# Patient Record
Sex: Female | Born: 1980
Health system: Southern US, Community
[De-identification: ages and names within clinical notes are randomized; demographics above are authoritative.]

## PROBLEM LIST (undated history)

## (undated) DIAGNOSIS — G35 Multiple sclerosis: Secondary | ICD-10-CM

## (undated) DIAGNOSIS — R9409 Abnormal results of other function studies of central nervous system: Secondary | ICD-10-CM

## (undated) DIAGNOSIS — R269 Unspecified abnormalities of gait and mobility: Secondary | ICD-10-CM

## (undated) DIAGNOSIS — I10 Essential (primary) hypertension: Secondary | ICD-10-CM

## (undated) DIAGNOSIS — Z9181 History of falling: Secondary | ICD-10-CM

## (undated) HISTORY — PX: GALLBLADDER SURGERY: SHX652

## (undated) HISTORY — PX: CHOLECYSTECTOMY: SHX55

## (undated) HISTORY — DX: Unspecified abnormalities of gait and mobility: R26.9

## (undated) HISTORY — DX: History of falling: Z91.81

## (undated) HISTORY — DX: Abnormal results of other function studies of central nervous system: R94.09

---

## 2007-12-08 ENCOUNTER — Emergency Department (HOSPITAL_BASED_OUTPATIENT_CLINIC_OR_DEPARTMENT_OTHER): Admission: EM | Admit: 2007-12-08 | Discharge: 2007-12-09 | Payer: Self-pay | Admitting: Emergency Medicine

## 2008-03-11 ENCOUNTER — Emergency Department (HOSPITAL_BASED_OUTPATIENT_CLINIC_OR_DEPARTMENT_OTHER): Admission: EM | Admit: 2008-03-11 | Discharge: 2008-03-11 | Payer: Self-pay | Admitting: Emergency Medicine

## 2008-04-18 ENCOUNTER — Emergency Department (HOSPITAL_BASED_OUTPATIENT_CLINIC_OR_DEPARTMENT_OTHER): Admission: EM | Admit: 2008-04-18 | Discharge: 2008-04-18 | Payer: Self-pay | Admitting: Emergency Medicine

## 2008-11-22 ENCOUNTER — Emergency Department (HOSPITAL_BASED_OUTPATIENT_CLINIC_OR_DEPARTMENT_OTHER): Admission: EM | Admit: 2008-11-22 | Discharge: 2008-11-22 | Payer: Self-pay | Admitting: Emergency Medicine

## 2009-06-03 ENCOUNTER — Emergency Department (HOSPITAL_BASED_OUTPATIENT_CLINIC_OR_DEPARTMENT_OTHER): Admission: EM | Admit: 2009-06-03 | Discharge: 2009-06-03 | Payer: Self-pay | Admitting: Emergency Medicine

## 2010-05-12 ENCOUNTER — Emergency Department (HOSPITAL_BASED_OUTPATIENT_CLINIC_OR_DEPARTMENT_OTHER)
Admission: EM | Admit: 2010-05-12 | Discharge: 2010-05-12 | Payer: Self-pay | Source: Home / Self Care | Admitting: Emergency Medicine

## 2010-05-18 ENCOUNTER — Emergency Department (HOSPITAL_BASED_OUTPATIENT_CLINIC_OR_DEPARTMENT_OTHER)
Admission: EM | Admit: 2010-05-18 | Discharge: 2010-05-18 | Payer: Self-pay | Source: Home / Self Care | Admitting: Emergency Medicine

## 2010-05-19 ENCOUNTER — Emergency Department (HOSPITAL_BASED_OUTPATIENT_CLINIC_OR_DEPARTMENT_OTHER)
Admission: EM | Admit: 2010-05-19 | Discharge: 2010-05-19 | Payer: Self-pay | Source: Home / Self Care | Admitting: Emergency Medicine

## 2010-06-22 ENCOUNTER — Encounter
Admission: RE | Admit: 2010-06-22 | Discharge: 2010-06-22 | Payer: Self-pay | Source: Home / Self Care | Attending: Neurology | Admitting: Neurology

## 2010-07-04 ENCOUNTER — Encounter: Payer: Self-pay | Admitting: Neurology

## 2010-07-19 ENCOUNTER — Encounter (HOSPITAL_COMMUNITY)
Admission: RE | Admit: 2010-07-19 | Discharge: 2010-07-19 | Disposition: A | Discharge status: Home / Self Care | Payer: Managed Care, Other (non HMO) | Source: Ambulatory Visit | Attending: Neurology | Admitting: Neurology

## 2010-07-19 DIAGNOSIS — G35 Multiple sclerosis: Secondary | ICD-10-CM | POA: Insufficient documentation

## 2010-08-13 LAB — URINALYSIS, ROUTINE W REFLEX MICROSCOPIC
Hgb urine dipstick: NEGATIVE
Nitrite: NEGATIVE
Protein, ur: NEGATIVE mg/dL
Specific Gravity, Urine: 1.025 (ref 1.005–1.030)
Urobilinogen, UA: 2 mg/dL — ABNORMAL HIGH (ref 0.0–1.0)

## 2010-08-13 LAB — GLUCOSE, CAPILLARY: Glucose-Capillary: 85 mg/dL (ref 70–99)

## 2010-08-13 LAB — URINE CULTURE

## 2010-08-13 LAB — POCT TOXICOLOGY PANEL

## 2010-08-13 LAB — CBC
MCH: 28.3 pg (ref 26.0–34.0)
MCHC: 33.9 g/dL (ref 30.0–36.0)
MCV: 83.4 fL (ref 78.0–100.0)
Platelets: 340 10*3/uL (ref 150–400)

## 2010-08-13 LAB — COMPREHENSIVE METABOLIC PANEL
AST: 17 U/L (ref 0–37)
Albumin: 4.7 g/dL (ref 3.5–5.2)
BUN: 13 mg/dL (ref 6–23)
Calcium: 9.6 mg/dL (ref 8.4–10.5)
Creatinine, Ser: 0.8 mg/dL (ref 0.4–1.2)
GFR calc Af Amer: >60 mL/min (ref >60)
GFR calc non Af Amer: >60 mL/min (ref >60)

## 2010-08-13 LAB — URINE MICROSCOPIC-ADD ON

## 2010-08-13 LAB — DIFFERENTIAL
Basophils Absolute: 0 10*3/uL (ref 0.0–0.1)
Eosinophils Relative: 1 % (ref 0–5)
Lymphocytes Relative: 17 % (ref 12–46)
Lymphs Abs: 1.8 10*3/uL (ref 0.7–4.0)
Neutro Abs: 8 10*3/uL — ABNORMAL HIGH (ref 1.7–7.7)

## 2010-08-14 LAB — BASIC METABOLIC PANEL
CO2: 29 mEq/L (ref 19–32)
Calcium: 9.7 mg/dL (ref 8.4–10.5)
Chloride: 102 mEq/L (ref 96–112)
GFR calc Af Amer: >60 mL/min (ref >60)
Glucose, Bld: 66 mg/dL — ABNORMAL LOW (ref 70–99)
Potassium: 3.7 mEq/L (ref 3.5–5.1)
Sodium: 145 mEq/L (ref 135–145)

## 2010-08-14 LAB — DIFFERENTIAL
Eosinophils Absolute: 0.1 10*3/uL (ref 0.0–0.7)
Lymphs Abs: 1.8 10*3/uL (ref 0.7–4.0)
Monocytes Absolute: 0.5 10*3/uL (ref 0.1–1.0)
Monocytes Relative: 8 % (ref 3–12)
Neutrophils Relative %: 66 % (ref 43–77)

## 2010-08-14 LAB — CBC
HCT: 38 % (ref 36.0–46.0)
Hemoglobin: 12.9 g/dL (ref 12.0–15.0)
MCH: 28.1 pg (ref 26.0–34.0)
MCHC: 33.9 g/dL (ref 30.0–36.0)
RBC: 4.59 MIL/uL (ref 3.87–5.11)

## 2010-08-16 ENCOUNTER — Encounter (HOSPITAL_COMMUNITY): Payer: Managed Care, Other (non HMO) | Attending: Neurology

## 2010-08-16 DIAGNOSIS — G35 Multiple sclerosis: Secondary | ICD-10-CM | POA: Insufficient documentation

## 2010-09-10 LAB — BASIC METABOLIC PANEL
CO2: 28 mEq/L (ref 19–32)
Chloride: 105 mEq/L (ref 96–112)
GFR calc Af Amer: >60 mL/min (ref >60)
Glucose, Bld: 91 mg/dL (ref 70–99)
Sodium: 142 mEq/L (ref 135–145)

## 2010-09-10 LAB — CBC
Hemoglobin: 12 g/dL (ref 12.0–15.0)
MCHC: 34 g/dL (ref 30.0–36.0)
MCV: 86.7 fL (ref 78.0–100.0)
RBC: 4.07 MIL/uL (ref 3.87–5.11)
RDW: 13.6 % (ref 11.5–15.5)

## 2010-09-10 LAB — URINALYSIS, ROUTINE W REFLEX MICROSCOPIC
Bilirubin Urine: NEGATIVE
Glucose, UA: NEGATIVE mg/dL
Ketones, ur: NEGATIVE mg/dL
Protein, ur: NEGATIVE mg/dL

## 2010-09-10 LAB — DIFFERENTIAL
Basophils Relative: 0 % (ref 0–1)
Eosinophils Absolute: 0 10*3/uL (ref 0.0–0.7)
Eosinophils Relative: 0 % (ref 0–5)
Monocytes Absolute: 0.6 10*3/uL (ref 0.1–1.0)
Monocytes Relative: 9 % (ref 3–12)

## 2010-09-10 LAB — URINE MICROSCOPIC-ADD ON

## 2010-09-13 ENCOUNTER — Encounter (HOSPITAL_COMMUNITY)
Admission: RE | Admit: 2010-09-13 | Discharge: 2010-09-13 | Disposition: A | Discharge status: Home / Self Care | Payer: Managed Care, Other (non HMO) | Source: Ambulatory Visit | Attending: Neurology | Admitting: Neurology

## 2010-09-13 DIAGNOSIS — G35 Multiple sclerosis: Secondary | ICD-10-CM | POA: Insufficient documentation

## 2010-10-11 ENCOUNTER — Encounter (HOSPITAL_COMMUNITY)
Admission: RE | Admit: 2010-10-11 | Discharge: 2010-10-11 | Disposition: A | Discharge status: Home / Self Care | Payer: Managed Care, Other (non HMO) | Source: Ambulatory Visit | Attending: Neurology | Admitting: Neurology

## 2010-10-11 DIAGNOSIS — G35 Multiple sclerosis: Secondary | ICD-10-CM | POA: Insufficient documentation

## 2010-11-07 ENCOUNTER — Encounter (HOSPITAL_COMMUNITY): Payer: Managed Care, Other (non HMO)

## 2010-11-08 ENCOUNTER — Encounter (HOSPITAL_COMMUNITY): Payer: Managed Care, Other (non HMO) | Attending: Neurology

## 2010-11-08 ENCOUNTER — Encounter (HOSPITAL_COMMUNITY): Payer: Managed Care, Other (non HMO)

## 2010-11-08 DIAGNOSIS — G35 Multiple sclerosis: Secondary | ICD-10-CM | POA: Insufficient documentation

## 2010-11-09 ENCOUNTER — Encounter (HOSPITAL_COMMUNITY): Payer: Managed Care, Other (non HMO)

## 2010-11-12 ENCOUNTER — Encounter (HOSPITAL_COMMUNITY): Payer: Managed Care, Other (non HMO)

## 2010-11-13 ENCOUNTER — Encounter (HOSPITAL_COMMUNITY): Payer: Managed Care, Other (non HMO)

## 2010-11-14 ENCOUNTER — Encounter (HOSPITAL_COMMUNITY): Payer: Managed Care, Other (non HMO)

## 2010-11-15 ENCOUNTER — Encounter (HOSPITAL_COMMUNITY): Payer: Managed Care, Other (non HMO)

## 2010-11-16 ENCOUNTER — Encounter (HOSPITAL_COMMUNITY): Payer: Managed Care, Other (non HMO)

## 2010-11-19 ENCOUNTER — Encounter (HOSPITAL_COMMUNITY): Payer: Managed Care, Other (non HMO)

## 2010-11-20 ENCOUNTER — Encounter (HOSPITAL_COMMUNITY): Payer: Managed Care, Other (non HMO)

## 2010-11-21 ENCOUNTER — Encounter (HOSPITAL_COMMUNITY): Payer: Managed Care, Other (non HMO)

## 2010-11-22 ENCOUNTER — Encounter (HOSPITAL_COMMUNITY): Payer: Managed Care, Other (non HMO)

## 2010-11-23 ENCOUNTER — Encounter (HOSPITAL_COMMUNITY): Payer: Managed Care, Other (non HMO)

## 2010-11-26 ENCOUNTER — Encounter (HOSPITAL_COMMUNITY): Payer: Managed Care, Other (non HMO)

## 2010-11-27 ENCOUNTER — Encounter (HOSPITAL_COMMUNITY): Payer: Managed Care, Other (non HMO)

## 2010-11-28 ENCOUNTER — Encounter (HOSPITAL_COMMUNITY): Payer: Managed Care, Other (non HMO)

## 2010-11-29 ENCOUNTER — Encounter (HOSPITAL_COMMUNITY): Payer: Managed Care, Other (non HMO)

## 2010-11-30 ENCOUNTER — Encounter (HOSPITAL_COMMUNITY): Payer: Managed Care, Other (non HMO)

## 2010-12-03 ENCOUNTER — Encounter (HOSPITAL_COMMUNITY): Payer: Managed Care, Other (non HMO) | Attending: Neurology

## 2010-12-03 DIAGNOSIS — G35 Multiple sclerosis: Secondary | ICD-10-CM | POA: Insufficient documentation

## 2010-12-04 ENCOUNTER — Encounter (HOSPITAL_COMMUNITY): Payer: Managed Care, Other (non HMO)

## 2010-12-06 ENCOUNTER — Encounter (HOSPITAL_COMMUNITY): Payer: Managed Care, Other (non HMO)

## 2010-12-06 ENCOUNTER — Encounter (HOSPITAL_COMMUNITY): Payer: Managed Care, Other (non HMO) | Attending: Neurology

## 2010-12-06 DIAGNOSIS — G35 Multiple sclerosis: Secondary | ICD-10-CM | POA: Insufficient documentation

## 2010-12-07 ENCOUNTER — Encounter (HOSPITAL_COMMUNITY): Payer: Managed Care, Other (non HMO)

## 2011-01-03 ENCOUNTER — Encounter (HOSPITAL_COMMUNITY): Payer: Managed Care, Other (non HMO)

## 2011-01-10 ENCOUNTER — Encounter (HOSPITAL_COMMUNITY): Payer: Managed Care, Other (non HMO) | Attending: Neurology

## 2011-01-10 DIAGNOSIS — G35 Multiple sclerosis: Secondary | ICD-10-CM | POA: Insufficient documentation

## 2011-02-07 ENCOUNTER — Encounter (HOSPITAL_COMMUNITY): Payer: Managed Care, Other (non HMO)

## 2011-02-08 ENCOUNTER — Ambulatory Visit (HOSPITAL_COMMUNITY)
Admission: RE | Admit: 2011-02-08 | Discharge: 2011-02-08 | Disposition: A | Discharge status: Home / Self Care | Payer: Managed Care, Other (non HMO) | Source: Ambulatory Visit | Attending: Neurology | Admitting: Neurology

## 2011-02-08 DIAGNOSIS — G35 Multiple sclerosis: Secondary | ICD-10-CM | POA: Insufficient documentation

## 2011-02-28 LAB — CBC
HCT: 32 — ABNORMAL LOW
Hemoglobin: 11 — ABNORMAL LOW
MCV: 84.1
Platelets: 312
WBC: 14 — ABNORMAL HIGH

## 2011-02-28 LAB — URINALYSIS, ROUTINE W REFLEX MICROSCOPIC
Bilirubin Urine: NEGATIVE
Nitrite: POSITIVE — AB
Specific Gravity, Urine: 1.01
Urobilinogen, UA: 1
pH: 6

## 2011-02-28 LAB — COMPREHENSIVE METABOLIC PANEL
Alkaline Phosphatase: 133 — ABNORMAL HIGH
BUN: 11
CO2: 24
Chloride: 103
Creatinine, Ser: 1
GFR calc non Af Amer: >60
Glucose, Bld: 119 — ABNORMAL HIGH
Potassium: 3.7
Total Bilirubin: 0.8

## 2011-02-28 LAB — URINE MICROSCOPIC-ADD ON

## 2011-02-28 LAB — DIFFERENTIAL
Basophils Absolute: 0.2 — ABNORMAL HIGH
Basophils Relative: 1
Lymphocytes Relative: 8 — ABNORMAL LOW
Monocytes Absolute: 0.5
Neutro Abs: 12.1 — ABNORMAL HIGH
Neutrophils Relative %: 87 — ABNORMAL HIGH

## 2011-03-08 ENCOUNTER — Encounter (HOSPITAL_COMMUNITY)
Admission: RE | Admit: 2011-03-08 | Discharge: 2011-03-08 | Disposition: A | Discharge status: Home / Self Care | Payer: Managed Care, Other (non HMO) | Source: Ambulatory Visit | Attending: Neurology | Admitting: Neurology

## 2011-03-08 DIAGNOSIS — G35 Multiple sclerosis: Secondary | ICD-10-CM | POA: Insufficient documentation

## 2011-04-05 ENCOUNTER — Encounter (HOSPITAL_COMMUNITY): Payer: Managed Care, Other (non HMO)

## 2011-04-12 ENCOUNTER — Other Ambulatory Visit (HOSPITAL_COMMUNITY): Payer: Self-pay | Admitting: *Deleted

## 2011-04-15 ENCOUNTER — Encounter (HOSPITAL_COMMUNITY): Payer: Self-pay

## 2011-04-15 ENCOUNTER — Encounter (HOSPITAL_COMMUNITY)
Admission: RE | Admit: 2011-04-15 | Discharge: 2011-04-15 | Disposition: A | Discharge status: Home / Self Care | Payer: Managed Care, Other (non HMO) | Source: Ambulatory Visit | Attending: Neurology | Admitting: Neurology

## 2011-04-15 DIAGNOSIS — G35 Multiple sclerosis: Secondary | ICD-10-CM | POA: Insufficient documentation

## 2011-04-15 HISTORY — DX: Essential (primary) hypertension: I10

## 2011-04-15 HISTORY — DX: Multiple sclerosis: G35

## 2011-04-15 MED ORDER — LORATADINE 10 MG PO TABS
10.0000 mg | ORAL_TABLET | Freq: Every day | ORAL | Status: DC
  Administered 2011-04-15: 10 mg via ORAL

## 2011-04-15 MED ORDER — SODIUM CHLORIDE 0.9 % IV SOLN
300.0000 mg | INTRAVENOUS | Status: DC
  Administered 2011-04-15: 300 mg via INTRAVENOUS
  Filled 2011-04-15: qty 15

## 2011-04-15 MED ORDER — SODIUM CHLORIDE 0.9 % IV SOLN
INTRAVENOUS | Status: DC
  Administered 2011-04-15: 13:00:00 via INTRAVENOUS

## 2011-04-15 MED ORDER — ACETAMINOPHEN 500 MG PO TABS
1000.0000 mg | ORAL_TABLET | Freq: Once | ORAL | Status: AC
  Administered 2011-04-15: 1000 mg via ORAL
  Filled 2011-04-15: qty 2

## 2011-05-10 ENCOUNTER — Other Ambulatory Visit (HOSPITAL_COMMUNITY): Payer: Self-pay | Admitting: *Deleted

## 2011-05-13 ENCOUNTER — Encounter (HOSPITAL_COMMUNITY): Admission: RE | Admit: 2011-05-13 | Payer: Managed Care, Other (non HMO) | Source: Ambulatory Visit

## 2011-05-20 ENCOUNTER — Other Ambulatory Visit (HOSPITAL_COMMUNITY): Payer: Self-pay | Admitting: *Deleted

## 2011-05-23 ENCOUNTER — Other Ambulatory Visit (HOSPITAL_COMMUNITY): Payer: Self-pay | Admitting: *Deleted

## 2011-05-24 ENCOUNTER — Encounter (HOSPITAL_COMMUNITY): Payer: Self-pay

## 2011-05-24 ENCOUNTER — Encounter (HOSPITAL_COMMUNITY)
Admission: RE | Admit: 2011-05-24 | Discharge: 2011-05-24 | Disposition: A | Discharge status: Home / Self Care | Payer: Managed Care, Other (non HMO) | Source: Ambulatory Visit | Attending: Neurology | Admitting: Neurology

## 2011-05-24 DIAGNOSIS — G35 Multiple sclerosis: Secondary | ICD-10-CM | POA: Insufficient documentation

## 2011-05-24 MED ORDER — SODIUM CHLORIDE 0.9 % IV SOLN: INTRAVENOUS | Status: DC

## 2011-05-24 MED ORDER — LORATADINE 10 MG PO TABS
10.0000 mg | ORAL_TABLET | ORAL | Status: DC
  Administered 2011-05-24: 10 mg via ORAL

## 2011-05-24 MED ORDER — ACETAMINOPHEN 500 MG PO TABS
1000.0000 mg | ORAL_TABLET | ORAL | Status: DC
  Administered 2011-05-24: 1000 mg via ORAL
  Filled 2011-05-24: qty 2

## 2011-05-24 MED ORDER — SODIUM CHLORIDE 0.9 % IV SOLN
300.0000 mg | INTRAVENOUS | Status: DC
  Administered 2011-05-24: 300 mg via INTRAVENOUS
  Filled 2011-05-24: qty 15

## 2011-06-21 ENCOUNTER — Encounter (HOSPITAL_COMMUNITY)
Admission: RE | Admit: 2011-06-21 | Discharge: 2011-06-21 | Disposition: A | Discharge status: Home / Self Care | Payer: Managed Care, Other (non HMO) | Source: Ambulatory Visit | Attending: Neurology | Admitting: Neurology

## 2011-06-21 ENCOUNTER — Encounter (HOSPITAL_COMMUNITY): Payer: Self-pay

## 2011-06-21 DIAGNOSIS — G35 Multiple sclerosis: Secondary | ICD-10-CM | POA: Insufficient documentation

## 2011-06-21 MED ORDER — LORATADINE 10 MG PO TABS
10.0000 mg | ORAL_TABLET | ORAL | Status: DC
  Administered 2011-06-21: 10 mg via ORAL

## 2011-06-21 MED ORDER — SODIUM CHLORIDE 0.9 % IV SOLN
300.0000 mg | INTRAVENOUS | Status: DC
  Administered 2011-06-21: 300 mg via INTRAVENOUS
  Filled 2011-06-21: qty 15

## 2011-06-21 MED ORDER — ACETAMINOPHEN 500 MG PO TABS
1000.0000 mg | ORAL_TABLET | ORAL | Status: DC
  Administered 2011-06-21: 1000 mg via ORAL
  Filled 2011-06-21: qty 2

## 2011-06-21 MED ORDER — SODIUM CHLORIDE 0.9 % IV SOLN
INTRAVENOUS | Status: DC
  Administered 2011-06-21: 250 mL via INTRAVENOUS

## 2011-07-19 ENCOUNTER — Encounter (HOSPITAL_COMMUNITY)
Admission: RE | Admit: 2011-07-19 | Discharge: 2011-07-19 | Disposition: A | Discharge status: Home / Self Care | Payer: Managed Care, Other (non HMO) | Source: Ambulatory Visit | Attending: Neurology | Admitting: Neurology

## 2011-07-19 ENCOUNTER — Encounter (HOSPITAL_COMMUNITY): Payer: Self-pay

## 2011-07-19 DIAGNOSIS — G35 Multiple sclerosis: Secondary | ICD-10-CM | POA: Insufficient documentation

## 2011-07-19 MED ORDER — LORATADINE 10 MG PO TABS
10.0000 mg | ORAL_TABLET | ORAL | Status: AC
  Administered 2011-07-19: 10 mg via ORAL

## 2011-07-19 MED ORDER — ACETAMINOPHEN 500 MG PO TABS
1000.0000 mg | ORAL_TABLET | ORAL | Status: AC
  Administered 2011-07-19: 1000 mg via ORAL

## 2011-07-19 MED ORDER — SODIUM CHLORIDE 0.9 % IV SOLN
INTRAVENOUS | Status: AC
  Administered 2011-07-19: 16:00:00 via INTRAVENOUS

## 2011-07-19 MED ORDER — SODIUM CHLORIDE 0.9 % IV SOLN
300.0000 mg | INTRAVENOUS | Status: DC
  Administered 2011-07-19: 300 mg via INTRAVENOUS
  Filled 2011-07-19: qty 15

## 2011-08-16 ENCOUNTER — Encounter (HOSPITAL_COMMUNITY)
Admission: RE | Admit: 2011-08-16 | Discharge: 2011-08-16 | Disposition: A | Discharge status: Home / Self Care | Payer: Managed Care, Other (non HMO) | Source: Ambulatory Visit | Attending: Neurology | Admitting: Neurology

## 2011-08-16 ENCOUNTER — Encounter (HOSPITAL_COMMUNITY): Payer: Self-pay

## 2011-08-16 DIAGNOSIS — G35 Multiple sclerosis: Secondary | ICD-10-CM | POA: Insufficient documentation

## 2011-08-16 MED ORDER — SODIUM CHLORIDE 0.9 % IV SOLN
INTRAVENOUS | Status: AC
  Administered 2011-08-16: 17:00:00 via INTRAVENOUS

## 2011-08-16 MED ORDER — LORATADINE 10 MG PO TABS
10.0000 mg | ORAL_TABLET | ORAL | Status: AC
  Administered 2011-08-16: 10 mg via ORAL

## 2011-08-16 MED ORDER — SODIUM CHLORIDE 0.9 % IV SOLN
300.0000 mg | INTRAVENOUS | Status: AC
  Administered 2011-08-16: 300 mg via INTRAVENOUS
  Filled 2011-08-16: qty 15

## 2011-08-16 MED ORDER — ACETAMINOPHEN 500 MG PO TABS
1000.0000 mg | ORAL_TABLET | ORAL | Status: AC
  Administered 2011-08-16: 1000 mg via ORAL

## 2011-09-13 ENCOUNTER — Encounter (HOSPITAL_COMMUNITY): Payer: Self-pay

## 2011-09-13 ENCOUNTER — Encounter (HOSPITAL_COMMUNITY)
Admission: RE | Admit: 2011-09-13 | Discharge: 2011-09-13 | Disposition: A | Discharge status: Home / Self Care | Payer: Managed Care, Other (non HMO) | Source: Ambulatory Visit | Attending: Neurology | Admitting: Neurology

## 2011-09-13 DIAGNOSIS — G35 Multiple sclerosis: Secondary | ICD-10-CM | POA: Insufficient documentation

## 2011-09-13 MED ORDER — SODIUM CHLORIDE 0.9 % IV SOLN
INTRAVENOUS | Status: AC
  Administered 2011-09-13: 17:00:00 via INTRAVENOUS

## 2011-09-13 MED ORDER — ACETAMINOPHEN 325 MG PO TABS
ORAL_TABLET | ORAL | Status: AC
  Administered 2011-09-13: 975 mg via ORAL
  Filled 2011-09-13: qty 3

## 2011-09-13 MED ORDER — ACETAMINOPHEN 500 MG PO TABS
1000.0000 mg | ORAL_TABLET | ORAL | Status: AC
  Administered 2011-09-13: 975 mg via ORAL

## 2011-09-13 MED ORDER — LORATADINE 10 MG PO TABS
10.0000 mg | ORAL_TABLET | ORAL | Status: AC
  Administered 2011-09-13: 10 mg via ORAL

## 2011-09-13 MED ORDER — LORATADINE 10 MG PO TABS
ORAL_TABLET | ORAL | Status: AC
  Administered 2011-09-13: 10 mg via ORAL
  Filled 2011-09-13: qty 1

## 2011-09-13 MED ORDER — SODIUM CHLORIDE 0.9 % IV SOLN
300.0000 mg | INTRAVENOUS | Status: AC
  Administered 2011-09-13: 300 mg via INTRAVENOUS
  Filled 2011-09-13: qty 15

## 2011-09-13 NOTE — Discharge Instructions (Signed)
Next appointments 5/10 4:30 pm and 6/7 4:30 pm Call if you need to reschedule [phone].   Call your MD if any problems or questions.

## 2011-09-20 ENCOUNTER — Encounter (HOSPITAL_COMMUNITY): Payer: Managed Care, Other (non HMO)

## 2011-10-01 ENCOUNTER — Other Ambulatory Visit: Payer: Self-pay | Admitting: Neurology

## 2011-10-01 DIAGNOSIS — G35 Multiple sclerosis: Secondary | ICD-10-CM

## 2011-10-11 ENCOUNTER — Encounter (HOSPITAL_COMMUNITY)
Admission: RE | Admit: 2011-10-11 | Discharge: 2011-10-11 | Disposition: A | Discharge status: Home / Self Care | Payer: Managed Care, Other (non HMO) | Source: Ambulatory Visit | Attending: Neurology | Admitting: Neurology

## 2011-10-11 ENCOUNTER — Encounter (HOSPITAL_COMMUNITY): Payer: Self-pay

## 2011-10-11 DIAGNOSIS — G35 Multiple sclerosis: Secondary | ICD-10-CM | POA: Insufficient documentation

## 2011-10-11 MED ORDER — SODIUM CHLORIDE 0.9 % IV SOLN
INTRAVENOUS | Status: AC
  Administered 2011-10-11: 17:00:00 via INTRAVENOUS

## 2011-10-11 MED ORDER — ACETAMINOPHEN 500 MG PO TABS
ORAL_TABLET | ORAL | Status: AC
  Administered 2011-10-11: 1000 mg via ORAL
  Filled 2011-10-11: qty 2

## 2011-10-11 MED ORDER — LORATADINE 10 MG PO TABS
ORAL_TABLET | ORAL | Status: AC
  Administered 2011-10-11: 10 mg via ORAL
  Filled 2011-10-11: qty 1

## 2011-10-11 MED ORDER — SODIUM CHLORIDE 0.9 % IV SOLN
300.0000 mg | INTRAVENOUS | Status: AC
  Administered 2011-10-11: 300 mg via INTRAVENOUS
  Filled 2011-10-11: qty 15

## 2011-10-11 MED ORDER — LORATADINE 10 MG PO TABS
10.0000 mg | ORAL_TABLET | ORAL | Status: AC
  Administered 2011-10-11: 10 mg via ORAL

## 2011-10-11 MED ORDER — ACETAMINOPHEN 500 MG PO TABS
1000.0000 mg | ORAL_TABLET | ORAL | Status: AC
  Administered 2011-10-11: 1000 mg via ORAL

## 2011-10-15 ENCOUNTER — Ambulatory Visit
Admission: RE | Admit: 2011-10-15 | Discharge: 2011-10-15 | Disposition: A | Discharge status: Home / Self Care | Payer: Managed Care, Other (non HMO) | Source: Ambulatory Visit | Attending: Neurology | Admitting: Neurology

## 2011-10-15 ENCOUNTER — Other Ambulatory Visit: Payer: Managed Care, Other (non HMO)

## 2011-10-15 DIAGNOSIS — G35 Multiple sclerosis: Secondary | ICD-10-CM

## 2011-10-15 MED ORDER — GADOBENATE DIMEGLUMINE 529 MG/ML IV SOLN
14.0000 mL | Freq: Once | INTRAVENOUS | Status: AC | PRN
  Administered 2011-10-15: 14 mL via INTRAVENOUS

## 2011-11-08 ENCOUNTER — Encounter (HOSPITAL_COMMUNITY)
Admission: RE | Admit: 2011-11-08 | Discharge: 2011-11-08 | Disposition: A | Discharge status: Home / Self Care | Payer: Managed Care, Other (non HMO) | Source: Ambulatory Visit | Attending: Neurology | Admitting: Neurology

## 2011-11-08 ENCOUNTER — Encounter (HOSPITAL_COMMUNITY): Payer: Self-pay

## 2011-11-08 DIAGNOSIS — G35 Multiple sclerosis: Secondary | ICD-10-CM | POA: Insufficient documentation

## 2011-11-08 MED ORDER — ACETAMINOPHEN 500 MG PO TABS
ORAL_TABLET | ORAL | Status: AC
  Administered 2011-11-08: 1000 mg via ORAL
  Filled 2011-11-08: qty 2

## 2011-11-08 MED ORDER — SODIUM CHLORIDE 0.9 % IV SOLN
300.0000 mg | INTRAVENOUS | Status: DC
  Administered 2011-11-08: 300 mg via INTRAVENOUS
  Filled 2011-11-08: qty 15

## 2011-11-08 MED ORDER — ACETAMINOPHEN 500 MG PO TABS
1000.0000 mg | ORAL_TABLET | ORAL | Status: DC
  Administered 2011-11-08: 1000 mg via ORAL

## 2011-11-08 MED ORDER — LORATADINE 10 MG PO TABS
ORAL_TABLET | ORAL | Status: AC
  Administered 2011-11-08: 10 mg via ORAL
  Filled 2011-11-08: qty 1

## 2011-11-08 MED ORDER — LORATADINE 10 MG PO TABS
10.0000 mg | ORAL_TABLET | ORAL | Status: DC
  Administered 2011-11-08: 10 mg via ORAL

## 2011-11-08 MED ORDER — SODIUM CHLORIDE 0.9 % IV SOLN
INTRAVENOUS | Status: DC
  Administered 2011-11-08: 16:00:00 via INTRAVENOUS

## 2011-11-08 NOTE — Discharge Instructions (Signed)
Natalizumab injection What is this medicine? NATALIZUMAB (na ta LIZ you mab) is used to treat relapsing multiple sclerosis. This drug is not a cure. It is also used to treat Crohn's disease. This medicine may be used for other purposes; ask your health care provider or pharmacist if you have questions. What should I tell my health care provider before I take this medicine? They need to know if you have any of these conditions: -immune system problems -progressive multifocal leukoencephalopathy (PML) -an unusual or allergic reaction to natalizumab, other medicines, foods, dyes, or preservatives -pregnant or trying to get pregnant -breast-feeding How should I use this medicine? This medicine is for infusion into a vein. It is given by a health care professional in a hospital or clinic setting. A special MedGuide will be given to you by the pharmacist with each prescription and refill. Be sure to read this information carefully each time. Talk to your pediatrician regarding the use of this medicine in children. This medicine is not approved for use in children. Overdosage: If you think you have taken too much of this medicine contact a poison control center or emergency room at once. NOTE: This medicine is only for you. Do not share this medicine with others. What if I miss a dose? It is important not to miss your dose. Call your doctor or health care professional if you are unable to keep an appointment. What may interact with this medicine? -azathioprine -cyclosporine -interferon -6-mercaptopurine -methotrexate -steroid medicines like prednisone or cortisone -TNF-alpha inhibitors like adalimumab, etanercept, and infliximab -vaccines This list may not describe all possible interactions. Give your health care provider a list of all the medicines, herbs, non-prescription drugs, or dietary supplements you use. Also tell them if you smoke, drink alcohol, or use illegal drugs. Some items may  interact with your medicine. What should I watch for while using this medicine? Your condition will be monitored carefully while you are receiving this medicine. Visit your doctor for regular check ups. Tell your doctor or healthcare professional if your symptoms do not start to get better or if they get worse. Stay away from people who are sick. Call your doctor or health care professional for advice if you get a fever, chills or sore throat, or other symptoms of a cold or flu. Do not treat yourself. In some patients, this medicine may cause a serious brain infection that may cause death. If you have any problems seeing, thinking, speaking, walking, or standing, tell your doctor right away. If you cannot reach your doctor, get urgent medical care. What side effects may I notice from receiving this medicine? Side effects that you should report to your doctor or health care professional as soon as possible: -allergic reactions like skin rash, itching or hives, swelling of the face, lips, or tongue -breathing problems -changes in vision -chest pain -dark urine -depression, feelings of sadness -dizziness -general ill feeling or flu-like symptoms -irregular, missed, or painful menstrual periods -light-colored stools -loss of appetite, nausea -muscle weakness -problems with balance, talking, or walking -right upper belly pain -unusually weak or tired -yellowing of the eyes or skin Side effects that usually do not require medical attention (report to your doctor or health care professional if they continue or are bothersome): -aches, pains -headache -stomach upset -tiredness This list may not describe all possible side effects. Call your doctor for medical advice about side effects. You may report side effects to FDA at 1-800-FDA-1088. Where should I keep my medicine? This drug   is given in a hospital or clinic and will not be stored at home. NOTE: This sheet is a summary. It may not cover  all possible information. If you have questions about this medicine, talk to your doctor, pharmacist, or health care provider.  2012, Elsevier/Gold Standard. (07/09/2008 1:33:21 PM) 

## 2011-12-06 ENCOUNTER — Encounter (HOSPITAL_COMMUNITY): Payer: Self-pay

## 2011-12-06 ENCOUNTER — Encounter (HOSPITAL_COMMUNITY)
Admission: RE | Admit: 2011-12-06 | Discharge: 2011-12-06 | Disposition: A | Discharge status: Home / Self Care | Payer: Managed Care, Other (non HMO) | Source: Ambulatory Visit | Attending: Neurology | Admitting: Neurology

## 2011-12-06 DIAGNOSIS — G35 Multiple sclerosis: Secondary | ICD-10-CM | POA: Insufficient documentation

## 2011-12-06 MED ORDER — ACETAMINOPHEN 500 MG PO TABS
1000.0000 mg | ORAL_TABLET | ORAL | Status: DC
  Administered 2011-12-06: 1000 mg via ORAL

## 2011-12-06 MED ORDER — SODIUM CHLORIDE 0.9 % IV SOLN
300.0000 mg | INTRAVENOUS | Status: DC
  Administered 2011-12-06: 300 mg via INTRAVENOUS
  Filled 2011-12-06: qty 15

## 2011-12-06 MED ORDER — SODIUM CHLORIDE 0.9 % IV SOLN
500.0000 mg | INTRAVENOUS | Status: DC | PRN
  Filled 2011-12-06: qty 4

## 2011-12-06 MED ORDER — LORATADINE 10 MG PO TABS
ORAL_TABLET | ORAL | Status: AC
  Administered 2011-12-06: 10 mg
  Filled 2011-12-06: qty 1

## 2011-12-06 MED ORDER — ACETAMINOPHEN 325 MG PO TABS
ORAL_TABLET | ORAL | Status: AC
  Filled 2011-12-06: qty 3

## 2011-12-06 MED ORDER — LORATADINE 10 MG PO TABS: 10.0000 mg | ORAL_TABLET | ORAL | Status: DC

## 2011-12-06 MED ORDER — EPINEPHRINE 0.3 MG/0.3ML IJ DEVI
0.3000 mg | INTRAMUSCULAR | Status: DC | PRN
  Filled 2011-12-06: qty 0.6

## 2011-12-06 MED ORDER — SODIUM CHLORIDE 0.9 % IV SOLN
INTRAVENOUS | Status: DC
  Administered 2011-12-06: 250 mL via INTRAVENOUS

## 2011-12-06 NOTE — Progress Notes (Addendum)
Pt's BP has been elevated while in short stay today. She says it happens at her Dr's office too  and they have been monitoring her . Pt states he MD is in [facility] [place] . States she saw him last week. Encouraged pt to call MD and schedule another appointment with him for her BP issues. Pt is unable to give me correct spelling of Dr or Office in [employer] but assures me she has that information at home and will contact her MD. She states she feels fine and decines offer to go to ED for evaluation today

## 2012-01-02 ENCOUNTER — Other Ambulatory Visit: Payer: Self-pay | Admitting: Neurology

## 2012-01-03 ENCOUNTER — Encounter (HOSPITAL_COMMUNITY)
Admission: RE | Admit: 2012-01-03 | Discharge: 2012-01-03 | Disposition: A | Discharge status: Home / Self Care | Payer: Managed Care, Other (non HMO) | Source: Ambulatory Visit | Attending: Neurology | Admitting: Neurology

## 2012-01-03 ENCOUNTER — Encounter (HOSPITAL_COMMUNITY): Payer: Self-pay

## 2012-01-03 DIAGNOSIS — G35 Multiple sclerosis: Secondary | ICD-10-CM | POA: Insufficient documentation

## 2012-01-03 MED ORDER — ACETAMINOPHEN 500 MG PO TABS
1000.0000 mg | ORAL_TABLET | ORAL | Status: DC
  Administered 2012-01-03: 1000 mg via ORAL

## 2012-01-03 MED ORDER — LORATADINE 10 MG PO TABS
10.0000 mg | ORAL_TABLET | ORAL | Status: DC
  Administered 2012-01-03: 10 mg via ORAL

## 2012-01-03 MED ORDER — SODIUM CHLORIDE 0.9 % IV SOLN
INTRAVENOUS | Status: DC
Start: 2012-01-03 — End: 2012-01-04
  Administered 2012-01-03: 16:00:00 via INTRAVENOUS

## 2012-01-03 MED ORDER — ACETAMINOPHEN 500 MG PO TABS: 1000.0000 mg | ORAL_TABLET | ORAL | Status: DC

## 2012-01-03 MED ORDER — SODIUM CHLORIDE 0.9 % IV SOLN
300.0000 mg | INTRAVENOUS | Status: DC
  Administered 2012-01-03: 300 mg via INTRAVENOUS
  Filled 2012-01-03: qty 15

## 2012-01-03 NOTE — Progress Notes (Signed)
Patients BP remains elevated. She states she had gone to her medical doctor in [facility] and he had instructed her to take atenolol at night instead of in the morning. RN instructs her to take recording of BPs in morning and night and take record to her MD.

## 2012-01-31 ENCOUNTER — Encounter (HOSPITAL_COMMUNITY): Payer: Self-pay

## 2012-01-31 ENCOUNTER — Encounter (HOSPITAL_COMMUNITY)
Admission: RE | Admit: 2012-01-31 | Discharge: 2012-01-31 | Disposition: A | Discharge status: Home / Self Care | Payer: Managed Care, Other (non HMO) | Source: Ambulatory Visit | Attending: Neurology | Admitting: Neurology

## 2012-01-31 MED ORDER — LORATADINE 10 MG PO TABS
10.0000 mg | ORAL_TABLET | ORAL | Status: DC
  Administered 2012-01-31: 10 mg via ORAL
  Filled 2012-01-31: qty 1

## 2012-01-31 MED ORDER — SODIUM CHLORIDE 0.9 % IV SOLN
INTRAVENOUS | Status: DC
  Administered 2012-01-31: 12:00:00 via INTRAVENOUS

## 2012-01-31 MED ORDER — SODIUM CHLORIDE 0.9 % IV SOLN
300.0000 mg | INTRAVENOUS | Status: DC
  Administered 2012-01-31: 300 mg via INTRAVENOUS
  Filled 2012-01-31: qty 15

## 2012-01-31 MED ORDER — ACETAMINOPHEN 500 MG PO TABS
1000.0000 mg | ORAL_TABLET | ORAL | Status: DC
  Administered 2012-01-31: 1000 mg via ORAL
  Filled 2012-01-31: qty 2

## 2012-01-31 NOTE — Progress Notes (Addendum)
Pt brought name and phone # of her MD in [facility]. Dr Dalene Carrow at [facility] for [facility]. [phone]. Pt has seen MD since her last tysabri visit and he did not change her BP medications as she did not present with elevated BP. RN will send copy of her vitals signs to her MD. 1500  Attempted to call Dr Dalene Carrow. Office is closed. Faxed a note to Dr Dalene Carrow which included todays elevated BP. Pt refused to go to ER.

## 2012-02-28 ENCOUNTER — Encounter (HOSPITAL_COMMUNITY): Payer: Self-pay

## 2012-02-28 ENCOUNTER — Encounter (HOSPITAL_COMMUNITY)
Admission: RE | Admit: 2012-02-28 | Discharge: 2012-02-28 | Disposition: A | Discharge status: Home / Self Care | Payer: Managed Care, Other (non HMO) | Source: Ambulatory Visit | Attending: Neurology | Admitting: Neurology

## 2012-02-28 DIAGNOSIS — G35 Multiple sclerosis: Secondary | ICD-10-CM | POA: Insufficient documentation

## 2012-02-28 MED ORDER — LORATADINE 10 MG PO TABS
10.0000 mg | ORAL_TABLET | ORAL | Status: AC
  Administered 2012-02-28: 10 mg via ORAL
  Filled 2012-02-28: qty 1

## 2012-02-28 MED ORDER — ACETAMINOPHEN 500 MG PO TABS
1000.0000 mg | ORAL_TABLET | ORAL | Status: AC
  Administered 2012-02-28: 1000 mg via ORAL
  Filled 2012-02-28: qty 2

## 2012-02-28 MED ORDER — SODIUM CHLORIDE 0.9 % IV SOLN
300.0000 mg | INTRAVENOUS | Status: AC
  Administered 2012-02-28: 300 mg via INTRAVENOUS
  Filled 2012-02-28: qty 15

## 2012-02-28 MED ORDER — SODIUM CHLORIDE 0.9 % IV SOLN
INTRAVENOUS | Status: AC
  Administered 2012-02-28: 12:00:00 via INTRAVENOUS

## 2012-02-28 NOTE — Progress Notes (Signed)
Pt presents to short stay with elevated BP. Pt states she just went to her neurologist yesterday and her BP was 123/88. She states her BP is elevated only when she comes to short stay.Will continue to observe and communicate with her neurologist and primary MD.

## 2012-03-27 ENCOUNTER — Encounter (HOSPITAL_COMMUNITY): Admission: RE | Admit: 2012-03-27 | Payer: Managed Care, Other (non HMO) | Source: Ambulatory Visit

## 2012-03-30 ENCOUNTER — Other Ambulatory Visit: Payer: Self-pay | Admitting: Neurology

## 2012-04-03 ENCOUNTER — Encounter (HOSPITAL_COMMUNITY): Payer: Self-pay

## 2012-04-03 ENCOUNTER — Encounter (HOSPITAL_COMMUNITY)
Admission: RE | Admit: 2012-04-03 | Discharge: 2012-04-03 | Disposition: A | Discharge status: Home / Self Care | Payer: Managed Care, Other (non HMO) | Source: Ambulatory Visit | Attending: Neurology | Admitting: Neurology

## 2012-04-03 DIAGNOSIS — G35 Multiple sclerosis: Secondary | ICD-10-CM | POA: Insufficient documentation

## 2012-04-03 MED ORDER — SODIUM CHLORIDE 0.9 % IV SOLN
INTRAVENOUS | Status: DC
  Administered 2012-04-03: 13:00:00 via INTRAVENOUS

## 2012-04-03 MED ORDER — SODIUM CHLORIDE 0.9 % IV SOLN
300.0000 mg | INTRAVENOUS | Status: DC
  Administered 2012-04-03: 300 mg via INTRAVENOUS
  Filled 2012-04-03: qty 15

## 2012-04-03 MED ORDER — LORATADINE 10 MG PO TABS
10.0000 mg | ORAL_TABLET | ORAL | Status: DC
  Administered 2012-04-03: 10 mg via ORAL
  Filled 2012-04-03: qty 1

## 2012-04-03 MED ORDER — ACETAMINOPHEN 500 MG PO TABS
1000.0000 mg | ORAL_TABLET | ORAL | Status: DC
  Administered 2012-04-03: 1000 mg via ORAL
  Filled 2012-04-03: qty 2

## 2012-04-03 MED ORDER — SODIUM CHLORIDE 0.9 % IV SOLN
500.0000 mg | INTRAVENOUS | Status: DC
  Filled 2012-04-03: qty 4

## 2012-04-03 MED ORDER — EPINEPHRINE 0.3 MG/0.3ML IJ DEVI
0.3000 mg | INTRAMUSCULAR | Status: DC
  Filled 2012-04-03: qty 0.6

## 2012-05-01 ENCOUNTER — Encounter (HOSPITAL_COMMUNITY)
Admission: RE | Admit: 2012-05-01 | Discharge: 2012-05-01 | Disposition: A | Discharge status: Home / Self Care | Payer: Managed Care, Other (non HMO) | Source: Ambulatory Visit | Attending: Neurology | Admitting: Neurology

## 2012-05-01 MED ORDER — SODIUM CHLORIDE 0.9 % IV SOLN
300.0000 mg | INTRAVENOUS | Status: DC
  Administered 2012-05-01: 300 mg via INTRAVENOUS
  Filled 2012-05-01: qty 15

## 2012-05-01 MED ORDER — ACETAMINOPHEN 500 MG PO TABS
1000.0000 mg | ORAL_TABLET | ORAL | Status: DC
  Administered 2012-05-01: 1000 mg via ORAL
  Filled 2012-05-01: qty 2

## 2012-05-01 MED ORDER — SODIUM CHLORIDE 0.9 % IV SOLN
INTRAVENOUS | Status: DC
  Administered 2012-05-01: 12:00:00 via INTRAVENOUS

## 2012-05-01 MED ORDER — LORATADINE 10 MG PO TABS
10.0000 mg | ORAL_TABLET | ORAL | Status: DC
  Administered 2012-05-01: 10 mg via ORAL
  Filled 2012-05-01: qty 1

## 2012-05-20 ENCOUNTER — Other Ambulatory Visit: Payer: Self-pay | Admitting: Neurology

## 2012-05-20 DIAGNOSIS — R9089 Other abnormal findings on diagnostic imaging of central nervous system: Secondary | ICD-10-CM

## 2012-05-20 DIAGNOSIS — R269 Unspecified abnormalities of gait and mobility: Secondary | ICD-10-CM

## 2012-05-20 DIAGNOSIS — G35 Multiple sclerosis: Secondary | ICD-10-CM

## 2012-05-28 ENCOUNTER — Ambulatory Visit
Admission: RE | Admit: 2012-05-28 | Discharge: 2012-05-28 | Disposition: A | Discharge status: Home / Self Care | Payer: Managed Care, Other (non HMO) | Source: Ambulatory Visit | Attending: Neurology | Admitting: Neurology

## 2012-05-28 DIAGNOSIS — G35 Multiple sclerosis: Secondary | ICD-10-CM

## 2012-05-28 DIAGNOSIS — R269 Unspecified abnormalities of gait and mobility: Secondary | ICD-10-CM

## 2012-05-28 DIAGNOSIS — R9089 Other abnormal findings on diagnostic imaging of central nervous system: Secondary | ICD-10-CM

## 2012-05-28 MED ORDER — GADOBENATE DIMEGLUMINE 529 MG/ML IV SOLN
14.0000 mL | Freq: Once | INTRAVENOUS | Status: AC | PRN
  Administered 2012-05-28: 14 mL via INTRAVENOUS

## 2012-05-29 ENCOUNTER — Encounter (HOSPITAL_COMMUNITY): Payer: Self-pay

## 2012-05-29 ENCOUNTER — Encounter (HOSPITAL_COMMUNITY)
Admission: RE | Admit: 2012-05-29 | Discharge: 2012-05-29 | Disposition: A | Discharge status: Home / Self Care | Payer: Managed Care, Other (non HMO) | Source: Ambulatory Visit | Attending: Neurology | Admitting: Neurology

## 2012-05-29 DIAGNOSIS — G35 Multiple sclerosis: Secondary | ICD-10-CM | POA: Insufficient documentation

## 2012-05-29 MED ORDER — SODIUM CHLORIDE 0.9 % IV SOLN
300.0000 mg | INTRAVENOUS | Status: AC
  Administered 2012-05-29: 300 mg via INTRAVENOUS
  Filled 2012-05-29: qty 15

## 2012-05-29 MED ORDER — LORATADINE 10 MG PO TABS
10.0000 mg | ORAL_TABLET | ORAL | Status: AC
  Administered 2012-05-29: 10 mg via ORAL
  Filled 2012-05-29: qty 1

## 2012-05-29 MED ORDER — SODIUM CHLORIDE 0.9 % IV SOLN
INTRAVENOUS | Status: AC
  Administered 2012-05-29: 11:00:00 via INTRAVENOUS

## 2012-05-29 MED ORDER — ACETAMINOPHEN 500 MG PO TABS
1000.0000 mg | ORAL_TABLET | ORAL | Status: AC
  Administered 2012-05-29: 1000 mg via ORAL
  Filled 2012-05-29: qty 2

## 2012-06-23 ENCOUNTER — Other Ambulatory Visit: Payer: Self-pay | Admitting: Neurology

## 2012-06-24 ENCOUNTER — Other Ambulatory Visit (HOSPITAL_COMMUNITY): Payer: Self-pay | Admitting: Neurology

## 2012-06-26 ENCOUNTER — Encounter (HOSPITAL_COMMUNITY)
Admission: RE | Admit: 2012-06-26 | Discharge: 2012-06-26 | Disposition: A | Discharge status: Home / Self Care | Payer: Managed Care, Other (non HMO) | Source: Ambulatory Visit | Attending: Neurology | Admitting: Neurology

## 2012-06-26 ENCOUNTER — Encounter (HOSPITAL_COMMUNITY): Payer: Self-pay

## 2012-06-26 DIAGNOSIS — G35 Multiple sclerosis: Secondary | ICD-10-CM | POA: Insufficient documentation

## 2012-06-26 MED ORDER — ACETAMINOPHEN 500 MG PO TABS
1000.0000 mg | ORAL_TABLET | ORAL | Status: DC
  Administered 2012-06-26: 1000 mg via ORAL
  Filled 2012-06-26: qty 2

## 2012-06-26 MED ORDER — NATALIZUMAB 300 MG/15ML IV CONC
300.0000 mg | INTRAVENOUS | Status: DC
  Administered 2012-06-26: 300 mg via INTRAVENOUS
  Filled 2012-06-26: qty 15

## 2012-06-26 MED ORDER — SODIUM CHLORIDE 0.9 % IV SOLN
INTRAVENOUS | Status: DC
  Administered 2012-06-26: 12:00:00 via INTRAVENOUS

## 2012-06-26 MED ORDER — LORATADINE 10 MG PO TABS
10.0000 mg | ORAL_TABLET | ORAL | Status: DC
  Administered 2012-06-26: 10 mg via ORAL
  Filled 2012-06-26: qty 1

## 2012-07-24 ENCOUNTER — Encounter (HOSPITAL_COMMUNITY): Admission: RE | Admit: 2012-07-24 | Payer: Self-pay | Source: Ambulatory Visit

## 2012-07-27 ENCOUNTER — Encounter (HOSPITAL_COMMUNITY)
Admission: RE | Admit: 2012-07-27 | Discharge: 2012-07-27 | Disposition: A | Discharge status: Home / Self Care | Payer: Self-pay | Source: Ambulatory Visit | Attending: Neurology | Admitting: Neurology

## 2012-07-27 ENCOUNTER — Encounter (HOSPITAL_COMMUNITY): Payer: Self-pay

## 2012-07-27 DIAGNOSIS — G35 Multiple sclerosis: Secondary | ICD-10-CM | POA: Insufficient documentation

## 2012-07-27 MED ORDER — LORATADINE 10 MG PO TABS
10.0000 mg | ORAL_TABLET | ORAL | Status: DC
  Administered 2012-07-27: 10 mg via ORAL
  Filled 2012-07-27: qty 1

## 2012-07-27 MED ORDER — SODIUM CHLORIDE 0.9 % IV SOLN
300.0000 mg | INTRAVENOUS | Status: DC
  Administered 2012-07-27: 300 mg via INTRAVENOUS
  Filled 2012-07-27 (×2): qty 15

## 2012-07-27 MED ORDER — ACETAMINOPHEN 500 MG PO TABS
1000.0000 mg | ORAL_TABLET | ORAL | Status: DC
  Administered 2012-07-27: 1000 mg via ORAL
  Filled 2012-07-27: qty 3

## 2012-07-27 MED ORDER — SODIUM CHLORIDE 0.9 % IV SOLN
INTRAVENOUS | Status: DC
  Administered 2012-07-27: 13:00:00 via INTRAVENOUS

## 2012-08-21 ENCOUNTER — Telehealth: Payer: Self-pay | Admitting: Neurology

## 2012-08-21 ENCOUNTER — Other Ambulatory Visit (HOSPITAL_COMMUNITY): Payer: Self-pay | Admitting: Neurology

## 2012-08-21 NOTE — Telephone Encounter (Signed)
Fax paper order to infusion center

## 2012-08-21 NOTE — Telephone Encounter (Signed)
Need orders for tysabri. (place under hospital orders).

## 2012-08-24 ENCOUNTER — Encounter (HOSPITAL_COMMUNITY): Payer: Self-pay

## 2012-08-24 ENCOUNTER — Encounter (HOSPITAL_COMMUNITY)
Admission: RE | Admit: 2012-08-24 | Discharge: 2012-08-24 | Disposition: A | Discharge status: Home / Self Care | Payer: Medicaid Other | Source: Ambulatory Visit | Attending: Neurology | Admitting: Neurology

## 2012-08-24 DIAGNOSIS — G35 Multiple sclerosis: Secondary | ICD-10-CM | POA: Insufficient documentation

## 2012-08-24 MED ORDER — LORATADINE 10 MG PO TABS
10.0000 mg | ORAL_TABLET | ORAL | Status: DC
  Administered 2012-08-24: 10 mg via ORAL
  Filled 2012-08-24: qty 1

## 2012-08-24 MED ORDER — NATALIZUMAB 300 MG/15ML IV CONC
300.0000 mg | INTRAVENOUS | Status: DC
  Administered 2012-08-24: 300 mg via INTRAVENOUS
  Filled 2012-08-24: qty 15

## 2012-08-24 MED ORDER — ACETAMINOPHEN 500 MG PO TABS
1000.0000 mg | ORAL_TABLET | ORAL | Status: DC
  Administered 2012-08-24: 1000 mg via ORAL
  Filled 2012-08-24: qty 2

## 2012-08-24 MED ORDER — SODIUM CHLORIDE 0.9 % IV SOLN
INTRAVENOUS | Status: DC
  Administered 2012-08-24: 15:00:00 via INTRAVENOUS

## 2012-08-24 NOTE — Telephone Encounter (Signed)
Done. To be scanned to EMR. ssy  08-21-12

## 2012-08-24 NOTE — Progress Notes (Signed)
Pt arrived today states she "has a cold" Temp 99.2 then 15 minutes later 98.7 Pt has raspy voice and dry cough but states she feels fine. Placed call to [facility] and talked with Lupita Leash who spoke withDr Terrace Arabia. Pt is OK to receive Tysabri today.

## 2012-08-24 NOTE — Progress Notes (Signed)
Patient had to leave at 1640 to pick up children. Infusion finished at 1555. Was unable to stay for the whole one hour observation period post Tysabri. VSS. Patient states she feels "fine".

## 2012-09-02 ENCOUNTER — Encounter: Payer: Self-pay | Admitting: Neurology

## 2012-09-02 ENCOUNTER — Telehealth: Payer: Self-pay

## 2012-09-02 DIAGNOSIS — R269 Unspecified abnormalities of gait and mobility: Secondary | ICD-10-CM | POA: Insufficient documentation

## 2012-09-02 DIAGNOSIS — R9409 Abnormal results of other function studies of central nervous system: Secondary | ICD-10-CM | POA: Insufficient documentation

## 2012-09-02 NOTE — Telephone Encounter (Signed)
I have called Tangie, left message.

## 2012-09-02 NOTE — Telephone Encounter (Signed)
Patient  Calling requesting to speak to Dr.Yan only.

## 2012-09-21 ENCOUNTER — Encounter (HOSPITAL_COMMUNITY)
Admission: RE | Admit: 2012-09-21 | Discharge: 2012-09-21 | Disposition: A | Discharge status: Home / Self Care | Payer: Medicaid Other | Source: Ambulatory Visit | Attending: Neurology | Admitting: Neurology

## 2012-09-21 ENCOUNTER — Encounter (HOSPITAL_COMMUNITY): Payer: Self-pay

## 2012-09-21 DIAGNOSIS — G35 Multiple sclerosis: Secondary | ICD-10-CM | POA: Insufficient documentation

## 2012-09-21 MED ORDER — SODIUM CHLORIDE 0.9 % IV SOLN
300.0000 mg | INTRAVENOUS | Status: DC
  Administered 2012-09-21: 300 mg via INTRAVENOUS
  Filled 2012-09-21: qty 15

## 2012-09-21 MED ORDER — ACETAMINOPHEN 500 MG PO TABS
1000.0000 mg | ORAL_TABLET | ORAL | Status: DC
  Administered 2012-09-21: 1000 mg via ORAL
  Filled 2012-09-21: qty 2

## 2012-09-21 MED ORDER — LORATADINE 10 MG PO TABS
10.0000 mg | ORAL_TABLET | ORAL | Status: DC
  Administered 2012-09-21: 10 mg via ORAL
  Filled 2012-09-21: qty 1

## 2012-09-21 MED ORDER — SODIUM CHLORIDE 0.9 % IV SOLN
INTRAVENOUS | Status: DC
  Administered 2012-09-21: 20 mL/h via INTRAVENOUS

## 2012-09-22 ENCOUNTER — Telehealth: Payer: Self-pay

## 2012-09-22 NOTE — Telephone Encounter (Signed)
Patient calling and wants to speak with Lupita Leash about social security . Social security was denied. Told patient Lupita Leash was out of the office and will have her return call 09/24/2012. Patient was fine to wait for Lupita Leash to return.

## 2012-09-30 NOTE — Telephone Encounter (Signed)
Spoke to Hope and she just wanted to let us know that she appealed her Social Security denial.  I told her there is nothing we do on our end, but she will keep [place] informed.

## 2012-10-19 ENCOUNTER — Encounter (HOSPITAL_COMMUNITY)
Admission: RE | Admit: 2012-10-19 | Discharge: 2012-10-19 | Disposition: A | Discharge status: Home / Self Care | Payer: Medicaid Other | Source: Ambulatory Visit | Attending: Neurology | Admitting: Neurology

## 2012-10-19 DIAGNOSIS — G35 Multiple sclerosis: Secondary | ICD-10-CM | POA: Insufficient documentation

## 2012-10-19 MED ORDER — SODIUM CHLORIDE 0.9 % IV SOLN
300.0000 mg | INTRAVENOUS | Status: DC
  Administered 2012-10-19: 300 mg via INTRAVENOUS
  Filled 2012-10-19: qty 15

## 2012-10-19 MED ORDER — ACETAMINOPHEN 500 MG PO TABS
1000.0000 mg | ORAL_TABLET | ORAL | Status: DC
  Administered 2012-10-19: 1000 mg via ORAL
  Filled 2012-10-19: qty 2

## 2012-10-19 MED ORDER — LORATADINE 10 MG PO TABS
10.0000 mg | ORAL_TABLET | ORAL | Status: DC
  Administered 2012-10-19: 10 mg via ORAL
  Filled 2012-10-19: qty 1

## 2012-10-19 MED ORDER — SODIUM CHLORIDE 0.9 % IV SOLN
INTRAVENOUS | Status: DC
  Administered 2012-10-19: 20 mL/h via INTRAVENOUS

## 2012-11-09 ENCOUNTER — Encounter: Payer: Self-pay | Admitting: Neurology

## 2012-11-09 DIAGNOSIS — Z9181 History of falling: Secondary | ICD-10-CM | POA: Insufficient documentation

## 2012-11-10 ENCOUNTER — Ambulatory Visit (INDEPENDENT_AMBULATORY_CARE_PROVIDER_SITE_OTHER): Payer: Medicaid Other | Admitting: Neurology

## 2012-11-10 ENCOUNTER — Encounter: Payer: Self-pay | Admitting: Neurology

## 2012-11-10 VITALS — BP 146/97 | HR 72 | Ht 63.0 in | Wt 168.0 lb

## 2012-11-10 DIAGNOSIS — G35D Multiple sclerosis, unspecified: Secondary | ICD-10-CM

## 2012-11-10 DIAGNOSIS — G35 Multiple sclerosis: Secondary | ICD-10-CM | POA: Insufficient documentation

## 2012-11-10 DIAGNOSIS — R269 Unspecified abnormalities of gait and mobility: Secondary | ICD-10-CM

## 2012-11-10 DIAGNOSIS — R9409 Abnormal results of other function studies of central nervous system: Secondary | ICD-10-CM

## 2012-11-10 DIAGNOSIS — Z9181 History of falling: Secondary | ICD-10-CM

## 2012-11-10 NOTE — Progress Notes (Signed)
History of Present Illness:   Julie Pope is a 32 year old right-handed [place] American female, follow up for MS.  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  She denied loss of consciousness, she denies incontinence, and bilateral upper extremity weakness, denied bulbar weakness, no visual change.  She is referred for MRI of the brain,in May 19, 2010 without contrast,   demonstrated diffuse white matter changes throughout the supratentorial and infratentorial region, with corpus callosum involvement,  MRI scan of the cervical spine shows demyelinating enhancing plaques at C 3 on right and  C 4-5 on left. consistent with MS  Repeat MRI scan of the brain in 2012,  showed multiple brainstem, cerebellar, subcortical, corpus callosal and periventricular white matter lesions typical for demyelinating disease. Several T 1 black holes and enhancing lesions are seen bilaterally suggesting chronic and active disease. Significant progression and worsening compared with noncontrast MRI 05/19/10.   CSF showed elevated Ig G index, 9 ocb, extensive lab test for mimic were negative, including HIV, RPR, b12 b6, ACE lyme SSA, B, CBC, CMP, PEP, ana, copper, mild ele crp 6.5, and very low vit D<4.  [name] Was started in February 2012,   she responded very well to tysabri, no side effect noticed, she is receiving it Thursday 5 PM every 28 days, she continues to has mild gait difficulty,  she can only walk half  miles each time, her legs would give out with prolonged walking, no upper extremity symptoms, no visual loss,  Repeat MRI of the brain 10/15/11 with slight improvement over previous study from 06/22/10.Prior enhancing lesions have resolved. Some of the chronic plaques have slightly  decreased in size. No new complaints. 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  Repeat MRI brain in 05/2012, shows multiple infratentorial and supratentorial white matter hyperintensities typical for multiple sclerosis. No enhancing lesions are noted. The presence of several T1 black holes and atrophy of corpus callosum indicates chronic disease. Overall no significant changes compared with previous MRI dated 10/15/2011.  UPDATE June 10th 2014: She has not gone to work since Sep 2013. She is approved for long term disability since April 2014, which only lasting for one year and half. She is in the process of apply for social disability, she is still receiving [name], continues gait difficulty, no significant worsening  Review of Systems  Out of a complete 14 system review, the patient complains of only the following symptoms, and all other reviewed systems are negative.   Constitutional:   N/A Cardiovascular:  N/A Ear/Nose/Throat:  N/A Skin: N/A Eyes: N/A Respiratory: N/A Gastroitestinal: N/A    Hematology/Lymphatic:  N/A Endocrine:  N/A Musculoskeletal:N/A Allergy/Immunology: N/A Neurological: gait difficulty Psychiatric:    N/A  Physical Exam  Neck: supple no carotid bruits Respiratory: clear to auscultation bilaterally Cardiovascular: regular rate rhythm  Neurologic Exam  Mental Status:   awake, alert, cooperative to history, talking, and casual conversation. Cranial Nerves: CN II-XII pupils  were equal round reactive to light.   Extraocular movements were full.  Visual fields were full on confrontational test.  Facial sensation and strength were normal.  Hearing was intact to finger rubbing bilaterally.  Uvula tongue were midline.  Head turning and shoulder shrugging were normal and symmetric.  Tongue protrusion into the cheeks strength were normal.  Motor: Normal tone, bulk, and strength, with exception of milid spasticity of bilateral lower extremity. Sensory: Normal to light touch, pinprick, proprioception, and vibratory sensation. Coordination: Normal finger-to-nose, heel-to-shin.  There was no dysmetria noticed. Gait and Station: Narrow based and mild hesitation, was able to perform tiptoe, heel, and tandem walking with mild to moderate difficulty. able to get up from chair arm crossed. She is able to squatting down and get up with out assistance.  Romberg sign: Negative Reflexes: Deep tendon reflexes: Biceps: 2/2, Brachioradialis: 2/2, Triceps: 2/2, Pateller: 2+/2+, Achilles: 2/2.  Plantar responses are flexor.   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  Repeat MRI brain done 10/15/2011 with overall slight improvement.  Will repeat MRI brain every 6 months 2. RTC with Eber Jones in 3 months 3. Continue Tysarbril

## 2012-11-16 ENCOUNTER — Encounter (HOSPITAL_COMMUNITY): Payer: Self-pay

## 2012-11-16 ENCOUNTER — Encounter (HOSPITAL_COMMUNITY)
Admission: RE | Admit: 2012-11-16 | Discharge: 2012-11-16 | Disposition: A | Discharge status: Home / Self Care | Payer: Medicaid Other | Source: Ambulatory Visit | Attending: Neurology | Admitting: Neurology

## 2012-11-16 DIAGNOSIS — G35 Multiple sclerosis: Secondary | ICD-10-CM | POA: Insufficient documentation

## 2012-11-16 MED ORDER — ACETAMINOPHEN 500 MG PO TABS
1000.0000 mg | ORAL_TABLET | ORAL | Status: AC
  Administered 2012-11-16: 1000 mg via ORAL
  Filled 2012-11-16: qty 2

## 2012-11-16 MED ORDER — SODIUM CHLORIDE 0.9 % IV SOLN
INTRAVENOUS | Status: AC
  Administered 2012-11-16: 250 mL via INTRAVENOUS

## 2012-11-16 MED ORDER — LORATADINE 10 MG PO TABS
10.0000 mg | ORAL_TABLET | ORAL | Status: AC
Start: 2012-11-16 — End: 2012-11-16
  Administered 2012-11-16: 10 mg via ORAL
  Filled 2012-11-16: qty 1

## 2012-11-16 MED ORDER — SODIUM CHLORIDE 0.9 % IV SOLN
300.0000 mg | INTRAVENOUS | Status: AC
  Administered 2012-11-16: 300 mg via INTRAVENOUS
  Filled 2012-11-16: qty 15

## 2012-11-16 MED ORDER — ACETAMINOPHEN 500 MG PO TABS: 1000.0000 mg | ORAL_TABLET | ORAL | Status: DC

## 2012-11-16 NOTE — Progress Notes (Signed)
Out ambulatory byself

## 2012-11-23 ENCOUNTER — Ambulatory Visit
Admission: RE | Admit: 2012-11-23 | Discharge: 2012-11-23 | Disposition: A | Discharge status: Home / Self Care | Payer: Medicaid Other | Source: Ambulatory Visit | Attending: Neurology | Admitting: Neurology

## 2012-11-23 DIAGNOSIS — Z9181 History of falling: Secondary | ICD-10-CM

## 2012-11-23 DIAGNOSIS — G35 Multiple sclerosis: Secondary | ICD-10-CM

## 2012-11-23 DIAGNOSIS — R269 Unspecified abnormalities of gait and mobility: Secondary | ICD-10-CM

## 2012-11-23 DIAGNOSIS — R9409 Abnormal results of other function studies of central nervous system: Secondary | ICD-10-CM

## 2012-11-23 MED ORDER — GADOBENATE DIMEGLUMINE 529 MG/ML IV SOLN
13.0000 mL | Freq: Once | INTRAVENOUS | Status: AC | PRN
  Administered 2012-11-23: 13 mL via INTRAVENOUS

## 2012-11-25 NOTE — Progress Notes (Signed)
Quick Note:  I left message for her.   MRI brain (with and without) showed 1. Multiple supratentorial and infratentorial chronic demyelinating plaques. 2. No acute demyelinating plaques. 3. No significant change from MRI on 05/28/12. ______

## 2012-12-09 ENCOUNTER — Telehealth: Payer: Self-pay | Admitting: *Deleted

## 2012-12-09 ENCOUNTER — Other Ambulatory Visit: Payer: Self-pay | Admitting: Neurology

## 2012-12-09 DIAGNOSIS — G35 Multiple sclerosis: Secondary | ICD-10-CM

## 2012-12-09 NOTE — Telephone Encounter (Signed)
Pt needs tysabri orders placed on EP[facility] for [facility] appt on 12-14-12.

## 2012-12-14 ENCOUNTER — Encounter (HOSPITAL_COMMUNITY): Payer: Self-pay

## 2012-12-14 ENCOUNTER — Encounter (HOSPITAL_COMMUNITY)
Admission: RE | Admit: 2012-12-14 | Discharge: 2012-12-14 | Disposition: A | Discharge status: Home / Self Care | Payer: Medicaid Other | Source: Ambulatory Visit | Attending: Neurology | Admitting: Neurology

## 2012-12-14 VITALS — BP 120/80 | HR 80 | Temp 97.2°F | Resp 18 | Ht 62.0 in | Wt 150.0 lb

## 2012-12-14 DIAGNOSIS — G35 Multiple sclerosis: Secondary | ICD-10-CM | POA: Insufficient documentation

## 2012-12-14 MED ORDER — ACETAMINOPHEN 500 MG PO TABS
1000.0000 mg | ORAL_TABLET | ORAL | Status: DC
  Administered 2012-12-14: 1000 mg via ORAL
  Filled 2012-12-14: qty 2

## 2012-12-14 MED ORDER — SODIUM CHLORIDE 0.9 % IV SOLN
300.0000 mg | INTRAVENOUS | Status: DC
  Administered 2012-12-14: 300 mg via INTRAVENOUS
  Filled 2012-12-14: qty 15

## 2012-12-14 MED ORDER — LORATADINE 10 MG PO TABS
10.0000 mg | ORAL_TABLET | ORAL | Status: DC
  Administered 2012-12-14: 10 mg via ORAL
  Filled 2012-12-14: qty 1

## 2012-12-14 MED ORDER — SODIUM CHLORIDE 0.9 % IV SOLN
INTRAVENOUS | Status: DC
  Administered 2012-12-14: 13:00:00 via INTRAVENOUS

## 2012-12-14 NOTE — Progress Notes (Signed)
Post TYSABRI infusion. Pt declines to stay the recommended 1 hour observation period. She states she will contact her Dr is she has any problems

## 2013-01-11 ENCOUNTER — Encounter (HOSPITAL_COMMUNITY): Payer: Self-pay

## 2013-01-11 ENCOUNTER — Encounter (HOSPITAL_COMMUNITY)
Admission: RE | Admit: 2013-01-11 | Discharge: 2013-01-11 | Disposition: A | Discharge status: Home / Self Care | Payer: Medicaid Other | Source: Ambulatory Visit | Attending: Neurology | Admitting: Neurology

## 2013-01-11 VITALS — BP 148/90 | HR 72 | Temp 97.9°F | Resp 18 | Ht 62.0 in | Wt 150.0 lb

## 2013-01-11 DIAGNOSIS — G35 Multiple sclerosis: Secondary | ICD-10-CM | POA: Insufficient documentation

## 2013-01-11 MED ORDER — SODIUM CHLORIDE 0.9 % IV SOLN
300.0000 mg | INTRAVENOUS | Status: AC
  Administered 2013-01-11 (×2): 300 mg via INTRAVENOUS
  Filled 2013-01-11: qty 15

## 2013-01-11 MED ORDER — LORATADINE 10 MG PO TABS
10.0000 mg | ORAL_TABLET | ORAL | Status: DC
  Administered 2013-01-11: 10 mg via ORAL
  Filled 2013-01-11: qty 1

## 2013-01-11 MED ORDER — ACETAMINOPHEN 500 MG PO TABS
1000.0000 mg | ORAL_TABLET | ORAL | Status: DC
  Administered 2013-01-11: 1000 mg via ORAL
  Filled 2013-01-11: qty 2

## 2013-01-11 MED ORDER — SODIUM CHLORIDE 0.9 % IV SOLN
INTRAVENOUS | Status: DC
  Administered 2013-01-11: 50 mL/h via INTRAVENOUS

## 2013-01-11 NOTE — Discharge Instructions (Signed)

## 2013-02-08 ENCOUNTER — Ambulatory Visit (HOSPITAL_COMMUNITY)
Admission: RE | Admit: 2013-02-08 | Discharge: 2013-02-08 | Disposition: A | Discharge status: Home / Self Care | Payer: Medicaid Other | Source: Ambulatory Visit | Attending: Neurology | Admitting: Neurology

## 2013-02-08 ENCOUNTER — Encounter (HOSPITAL_COMMUNITY): Payer: Self-pay

## 2013-02-08 VITALS — BP 136/93 | HR 76 | Temp 98.2°F | Resp 18

## 2013-02-08 DIAGNOSIS — G35 Multiple sclerosis: Secondary | ICD-10-CM | POA: Insufficient documentation

## 2013-02-08 MED ORDER — SODIUM CHLORIDE 0.9 % IV SOLN
INTRAVENOUS | Status: AC
  Administered 2013-02-08: 10:00:00 via INTRAVENOUS

## 2013-02-08 MED ORDER — ACETAMINOPHEN 500 MG PO TABS
1000.0000 mg | ORAL_TABLET | ORAL | Status: AC
  Administered 2013-02-08: 1000 mg via ORAL
  Filled 2013-02-08: qty 2

## 2013-02-08 MED ORDER — SODIUM CHLORIDE 0.9 % IV SOLN
300.0000 mg | INTRAVENOUS | Status: DC
  Administered 2013-02-08: 300 mg via INTRAVENOUS
  Filled 2013-02-08: qty 15

## 2013-02-08 MED ORDER — LORATADINE 10 MG PO TABS
10.0000 mg | ORAL_TABLET | ORAL | Status: AC
  Administered 2013-02-08: 10 mg via ORAL
  Filled 2013-02-08: qty 1

## 2013-02-08 NOTE — Progress Notes (Signed)
Patient stayed for 20 minutes post Tysabri infusion. Patient stated she could not stay for full hour observation.

## 2013-02-10 ENCOUNTER — Ambulatory Visit (INDEPENDENT_AMBULATORY_CARE_PROVIDER_SITE_OTHER): Payer: Medicaid Other | Admitting: Nurse Practitioner

## 2013-02-10 ENCOUNTER — Other Ambulatory Visit: Payer: Self-pay | Admitting: Neurology

## 2013-02-10 ENCOUNTER — Encounter: Payer: Self-pay | Admitting: Nurse Practitioner

## 2013-02-10 VITALS — BP 145/100 | HR 79 | Ht 63.5 in | Wt 175.0 lb

## 2013-02-10 DIAGNOSIS — Z79899 Other long term (current) drug therapy: Secondary | ICD-10-CM

## 2013-02-10 DIAGNOSIS — R9409 Abnormal results of other function studies of central nervous system: Secondary | ICD-10-CM

## 2013-02-10 DIAGNOSIS — G35 Multiple sclerosis: Secondary | ICD-10-CM

## 2013-02-10 DIAGNOSIS — R269 Unspecified abnormalities of gait and mobility: Secondary | ICD-10-CM

## 2013-02-10 LAB — COMPREHENSIVE METABOLIC PANEL
ALT: 9 IU/L (ref 0–32)
Albumin/Globulin Ratio: 1.6 (ref 1.1–2.5)
Calcium: 9.7 mg/dL (ref 8.7–10.2)
Creatinine, Ser: 0.82 mg/dL (ref 0.57–1.00)
GFR calc non Af Amer: 94 mL/min/{1.73_m2} (ref >59)
Globulin, Total: 2.7 g/dL (ref 1.5–4.5)
Glucose: 86 mg/dL (ref 65–99)
Potassium: 5.2 mmol/L (ref 3.5–5.2)
Total Protein: 7.1 g/dL (ref 6.0–8.5)

## 2013-02-10 LAB — CBC WITH DIFFERENTIAL/PLATELET
Basophils Absolute: 0 10*3/uL (ref 0.0–0.2)
Lymphs: 35 % (ref 14–46)
MCHC: 33.9 g/dL (ref 31.5–35.7)
Monocytes: 7 % (ref 4–12)
RDW: 14 % (ref 12.3–15.4)

## 2013-02-10 NOTE — Patient Instructions (Addendum)
Pt to f/u in 3 months.  Continue [name] monthly Repeat MRI of the brain 05/2013 Get labs today

## 2013-02-10 NOTE — Progress Notes (Signed)
[facility]  PATIENT: Julie Pope DOB: 1981/04/23   REASON FOR VISIT: Active progressive relapsing remitting multiple sclerosis   HISTORY OF PRESENT ILLNESS: [name], 32 -year-old right-handed [place] American female, follows up for MS.  She was last seen by Dr. Terrace Arabia 11/10/2012.  She has history of hypertension, in May 18, 2010,while walking towards her car at the end of her working day, her legs give out underneath her, she fell face down on the concrete pavement at her Parking lot infront of [employer], knocked out her front teeth.  She denied loss of consciousness, she denies incontinence, and bilateral upper extremity weakness, denied bulbar weakness, no visual change.  She is referred for MRI of the brain,in May 19, 2010 without contrast, demonstrated diffuse white matter changes throughout the supratentorial and infratentorial region, with corpus callosum involvement, MRI scan of the cervical spine shows demyelinating enhancing plaques at C 3 on right and C 4-5 on left. consistent with MS  Repeat MRI scan of the brain in 2012, showed multiple brainstem, cerebellar, subcortical, corpus callosal and periventricular white matter lesions typical for demyelinating disease. Several T 1 black holes and enhancing lesions are seen bilaterally suggesting chronic and active disease. Significant progression and worsening compared with noncontrast MRI 05/19/10.  CSF showed elevated Ig G index, 9 ocb, extensive lab test for mimic were negative, including HIV, RPR, b12 b6, ACE lyme SSA, B, CBC, CMP, PEP, ana, copper, mild ele crp 6.5, and very low vit D<4.  [name] Was started in February 2012, she responded very well to tysabri, no side effect noticed, she is receiving it Thursday 5 PM every 28 days, she continues to has mild gait difficulty, she can only walk half miles each time, her legs would give out with prolonged walking, no upper extremity symptoms, no  visual loss,  Repeat MRI of the brain 10/15/11 with slight improvement over previous study from 06/22/10.Prior enhancing lesions have resolved. Some of the chronic plaques have slightly decreased in size. No new complaints. 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  Repeat MRI brain in 05/2012, shows multiple infratentorial and supratentorial white matter hyperintensities typical for multiple sclerosis. No enhancing lesions are noted. The presence of several T1 black holes and atrophy of corpus callosum indicates chronic disease. Overall no significant changes compared with previous MRI dated 10/15/2011.  02/10/13. Returns for followup, doing well. Tysarbri  infusion 2 days ago. She has not had recent labs. Repeat MRI of the brain 11/23/2012 with multiple supra-tentorial and infratentorial chronic demyelinating plaques. No acute demyelinating plaques. No significant change from MRI on 05/28/2013. She denies any visual problems, any focal weakness, any sensory changes, any speech or swallowing difficulties. She denies any bowel or bladder issues.    REVIEW OF SYSTEMS: Full 14 system review of systems performed and notable only for:  Constitutional: N/A  Cardiovascular: N/A  Ear/Nose/Throat: N/A  Skin: N/A  Eyes: N/A  Respiratory: N/A  Gastroitestinal: N/A  Hematology/Lymphatic:  N/A  Endocrine: N/A Musculoskeletal:N/A  Allergy/Immunology: N/A  Neurological: N/A Psychiatric: N/A   ALLERGIES: No Known  Allergies  HOME MEDICATIONS: Outpatient Prescriptions Prior to Visit  Medication Sig Dispense Refill  . atenolol (TENORMIN) 50 MG tablet Take 50 mg by mouth daily.        . calcium citrate (CALCITRATE - DOSED IN MG ELEMENTAL CALCIUM) 950 MG tablet Take 1 tablet by mouth daily.        Marland Kitchen METOPROLOL TARTRATE PO Take by mouth as directed.      . sodium chloride 0.9 % SOLN 100 mL with natalizumab 300 MG/15ML CONC 300 mg Inject 300 mg into the vein.       No facility-administered medications prior to visit.    PAST MEDICAL HISTORY: Past Medical History  Diagnosis Date  . MS (multiple sclerosis)   . Hypertension   . Abnormality of gait   . Other nonspecific abnormal result of function study of brain and central nervous system   . Personal history of fall     PAST SURGICAL HISTORY: Past Surgical History  Procedure Laterality Date  . Cholecystectomy    . Gallbladder surgery      FAMILY HISTORY: Family History  Problem Relation Age of Onset  . Cancer    . High blood pressure      SOCIAL HISTORY: History   Social History  . Marital Status: Single    Spouse Name: N/A    Number of Children: 2  . 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 Smoking status: Never Smoker   . Smokeless tobacco: Never Used  . Alcohol Use: No  . Drug Use: No  . Sexual Activity: Not on file   Other Topics Concern  . Not on file   Social History Narrative   Patient has a high school education and two children. Patient works for [employer].     PHYSICAL EXAM  Filed Vitals:   02/10/13 0902  BP: 145/100  Pulse: 79  Height: 5' 3.5" (1.613 m)  Weight: 175 lb (79.379 kg)   Body mass index is 30.51 kg/(m^2).  Generalized: Well developed, in no acute distress  Head: normocephalic and atraumatic,. Oropharynx benign  Neck: Supple, no carotid bruits  Cardiac: Regular rate rhythm, no murmur  Musculoskeletal: No deformity    Neurological examination   Mentation: Alert oriented to time, place, history taking. Follows all commands speech and language fluent  Cranial nerve II-XII: Visual acuity 20/50 bilaterally . Pupils were equal round reactive to light extraocular movements were full, visual field were full on confrontational test. Facial sensation and strength were normal. hearing was intact to finger rubbing bilaterally. Uvula tongue midline. head turning and shoulder shrug and were normal and symmetric.Tongue protrusion into cheek strength was normal. Motor: normal bulk and tone, full strength in the BUE, BLE, with exception of mild spasticity both lower extremities  Sensory: normal and symmetric to light touch, pinprick, and  vibration  Coordination: finger-nose-finger, heel-to-shin bilaterally, no dysmetria Reflexes: Brachioradialis 2/2, biceps 2/2, triceps 2/2, patellar 2/2, Achilles 2/2, plantar responses were flexor bilaterally. Gait and Station: Rising up from seated position without assistance, normal stance, without trunk ataxia, moderate stride,  smooth turning, able to perform tiptoe, and heel walking without difficulty. Mild difficulty with tandem  DIAGNOSTIC DATA (LABS, IMAGING, TESTING) -Repeat MRI of the brain 11/23/2012 with multiple supra-tentorial and infratentorial chronic demyelinating plaques. No acute  demyelinating plaques. No significant change from MRI on 05/28/2013.    ASSESSMENT AND PLAN  32 y.o. year old female  has a past medical history of MS (multiple sclerosis); Hypertension; Abnormality of gait; Other nonspecific abnormal result of function study of brain and central nervous system. She is currently on Tysabri monthly. 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 Recent MRI without change. Next December. Will check CBC and CMP today Return for followup in 3 months Discussed with Dr. Terrace Arabia Will repeat JC antibody with index. Schedule MRI before next appt.  Nilda Riggs, [facility], [facility],  APRN  [facility] [address], [address] [place], [place] [address] [phone]

## 2013-02-11 ENCOUNTER — Telehealth: Payer: Self-pay | Admitting: *Deleted

## 2013-02-11 NOTE — Addendum Note (Signed)
Addended byHermenia Fiscal on: 02/11/2013 12:31 PM   Modules accepted: Orders

## 2013-02-11 NOTE — Progress Notes (Signed)
Quick Note:  LMVM for pt that labs looked good. If questions to call back. ______

## 2013-02-11 NOTE — Telephone Encounter (Addendum)
I called and spoke to CS with [facility] regarding the index result for JCV done on 05-11-12.  This was reported verbally to me as 0.98.  (>0.40 is positive).  [phone].   They could not fax me written report.  No need for additional testing. Will cancel the JCV index result.

## 2013-03-08 ENCOUNTER — Ambulatory Visit (HOSPITAL_COMMUNITY)
Admission: RE | Admit: 2013-03-08 | Discharge: 2013-03-08 | Disposition: A | Discharge status: Home / Self Care | Payer: Medicaid Other | Source: Ambulatory Visit | Attending: Neurology | Admitting: Neurology

## 2013-03-08 VITALS — BP 136/92 | HR 72 | Temp 98.1°F | Resp 17 | Ht 63.0 in | Wt 160.0 lb

## 2013-03-08 DIAGNOSIS — G35 Multiple sclerosis: Secondary | ICD-10-CM | POA: Insufficient documentation

## 2013-03-08 MED ORDER — SODIUM CHLORIDE 0.9 % IV SOLN
Freq: Once | INTRAVENOUS | Status: AC
  Administered 2013-03-08: 10:00:00 via INTRAVENOUS

## 2013-03-08 MED ORDER — SODIUM CHLORIDE 0.9 % IV SOLN
300.0000 mg | INTRAVENOUS | Status: AC
  Administered 2013-03-08: 300 mg via INTRAVENOUS
  Filled 2013-03-08: qty 15

## 2013-03-08 MED ORDER — ACETAMINOPHEN 500 MG PO TABS
1000.0000 mg | ORAL_TABLET | Freq: Once | ORAL | Status: AC
  Administered 2013-03-08: 1000 mg via ORAL
  Filled 2013-03-08: qty 2

## 2013-03-08 MED ORDER — LORATADINE 10 MG PO TABS
10.0000 mg | ORAL_TABLET | Freq: Once | ORAL | Status: AC
  Administered 2013-03-08: 10 mg via ORAL
  Filled 2013-03-08: qty 1

## 2013-03-11 ENCOUNTER — Telehealth: Payer: Self-pay

## 2013-03-12 ENCOUNTER — Telehealth: Payer: Self-pay | Admitting: Neurology

## 2013-03-12 NOTE — Telephone Encounter (Signed)
Returned call. No answer.  

## 2013-03-16 NOTE — Telephone Encounter (Signed)
Julie Pope:  Since you know her case well, please help again this time. We can help her fill forms, but again, wether she is granted disability or not, is up to her insurance company. Ask her or her insurance company fax over paper work

## 2013-03-16 NOTE — Telephone Encounter (Signed)
Patient called because she wants to know if Dr. Terrace Arabia will agree to her going out on permanent disability. Patient states that she works at [employer] of [place] and spends much time standing and sitting. She also states that she is unable to perform other jobs at this time. I reviewed her last OV note and let her know I will forward request on to Dr. Terrace Arabia. I let patient know that she will have to get disability forms from employer for [place] to complete. I will have Dr. Terrace Arabia advise me of her medical opinion regarding a permanent disability status.  Also, I let patient know that her FMLA form has been in medical records awaiting a release of information and payment. I asked that patient speak with medical records to have this taken care of so I can move forward with completing forms.

## 2013-03-17 ENCOUNTER — Telehealth: Payer: Self-pay | Admitting: Neurology

## 2013-03-18 NOTE — Telephone Encounter (Signed)
Please see updated telephone 03-18-13.

## 2013-03-18 NOTE — Telephone Encounter (Signed)
I spoke to patient, the patient has not gone back to work at [employer] [place], but has been getting LTD through [employer].  The recent forms we received are for continuation of LTD benefits.  I spoke to [employer] and they need supporting documentation and any recent updated notes along with the completed forms.   According to the customer service rep, we may receive requests for information every 30-60days in order for [employer] to continue to approve benefits.  I will fill out forms with the help of the doctor.

## 2013-04-02 ENCOUNTER — Telehealth: Payer: Self-pay | Admitting: Neurology

## 2013-04-02 ENCOUNTER — Other Ambulatory Visit: Payer: Self-pay | Admitting: Neurology

## 2013-04-02 DIAGNOSIS — G35 Multiple sclerosis: Secondary | ICD-10-CM

## 2013-04-02 NOTE — Telephone Encounter (Signed)
[facility] called and needs a new order put in Epic for Tysabri.  Patient's appointment is Monday.

## 2013-04-05 ENCOUNTER — Ambulatory Visit (HOSPITAL_COMMUNITY): Admission: RE | Admit: 2013-04-05 | Payer: Medicaid Other | Source: Ambulatory Visit

## 2013-04-26 ENCOUNTER — Encounter (HOSPITAL_COMMUNITY): Payer: Self-pay

## 2013-04-26 ENCOUNTER — Encounter (HOSPITAL_COMMUNITY)
Admission: RE | Admit: 2013-04-26 | Discharge: 2013-04-26 | Disposition: A | Discharge status: Home / Self Care | Payer: Medicaid Other | Source: Ambulatory Visit | Attending: Neurology | Admitting: Neurology

## 2013-04-26 VITALS — BP 118/81 | HR 85 | Temp 97.7°F | Resp 18 | Ht 62.0 in | Wt 150.0 lb

## 2013-04-26 DIAGNOSIS — G35 Multiple sclerosis: Secondary | ICD-10-CM | POA: Insufficient documentation

## 2013-04-26 MED ORDER — SODIUM CHLORIDE 0.9 % IV SOLN
INTRAVENOUS | Status: DC
  Administered 2013-04-26: 12:00:00 via INTRAVENOUS

## 2013-04-26 MED ORDER — LORATADINE 10 MG PO TABS
10.0000 mg | ORAL_TABLET | ORAL | Status: DC
  Administered 2013-04-26: 10 mg via ORAL
  Filled 2013-04-26: qty 1

## 2013-04-26 MED ORDER — SODIUM CHLORIDE 0.9 % IV SOLN
300.0000 mg | INTRAVENOUS | Status: DC
  Administered 2013-04-26: 300 mg via INTRAVENOUS
  Filled 2013-04-26: qty 15

## 2013-04-26 MED ORDER — ACETAMINOPHEN 500 MG PO TABS
1000.0000 mg | ORAL_TABLET | ORAL | Status: DC
  Administered 2013-04-26: 1000 mg via ORAL
  Filled 2013-04-26: qty 2

## 2013-04-26 NOTE — Progress Notes (Signed)
Pt stayed 30 minutes of the recommended 1 hour observation post Tysabri infusion. Pt desires to leave now. She states if she has any problems she will contact her MD

## 2013-05-03 ENCOUNTER — Ambulatory Visit (HOSPITAL_COMMUNITY): Payer: Medicaid Other

## 2013-05-17 ENCOUNTER — Ambulatory Visit (INDEPENDENT_AMBULATORY_CARE_PROVIDER_SITE_OTHER): Payer: Self-pay | Admitting: Nurse Practitioner

## 2013-05-17 ENCOUNTER — Ambulatory Visit (INDEPENDENT_AMBULATORY_CARE_PROVIDER_SITE_OTHER): Payer: Medicaid Other | Admitting: Neurology

## 2013-05-17 ENCOUNTER — Encounter: Payer: Self-pay | Admitting: Nurse Practitioner

## 2013-05-17 VITALS — BP 143/100 | HR 84 | Ht 63.5 in | Wt 176.0 lb

## 2013-05-17 DIAGNOSIS — R269 Unspecified abnormalities of gait and mobility: Secondary | ICD-10-CM

## 2013-05-17 DIAGNOSIS — G35 Multiple sclerosis: Secondary | ICD-10-CM

## 2013-05-17 DIAGNOSIS — Z79899 Other long term (current) drug therapy: Secondary | ICD-10-CM

## 2013-05-17 NOTE — Progress Notes (Signed)
Patient here seeing Eber Jones for office visit.  Order for JCV antibody lab draw.  Patient to treatment room.  First attempt right FA unsuccessful, second attempt by Ar[facility] successful in right AC using 23g butterfly.  Tolerated well.  Bandaid applied.

## 2013-05-17 NOTE — Progress Notes (Signed)
[facility]  PATIENT: Julie Pope DOB: 16-Dec-1980   REASON FOR VISIT: Follow up for MS    HISTORY OF PRESENT ILLNESS: [name], 32 year old white female returns for followup. She has a history of multiple sclerosis and is currently on Tysabri monthly. She is due for an MRI this month of the brain in addition she needs to have her JC virus index repeated the last was December of 2013. She denies any visual symptoms, she has not fallen. She does have some bladder urgency frequency and wears depends at times. She denies any significant weakness, any sensory changes any speech or swallowing problems. She returns for reevaluation.    HISTORY:She has history of hypertension, in May 18, 2010,while walking towards her car at the end of her working day, her legs give out underneath her, she fell face down on the concrete pavement at her Parking lot infront of [employer], knocked out her front teeth.  She denied loss of consciousness, she denies incontinence, and bilateral upper extremity weakness, denied bulbar weakness, no visual change.  She is referred for MRI of the brain,in May 19, 2010 without contrast, demonstrated diffuse white matter changes throughout the supratentorial and infratentorial region, with corpus callosum involvement, MRI scan of the cervical spine shows demyelinating enhancing plaques at C 3 on right and C 4-5 on left. consistent with MS  Repeat MRI scan of the brain in 2012, showed multiple brainstem, cerebellar, subcortical, corpus callosal and periventricular white matter lesions typical for demyelinating disease. Several T 1 black holes and enhancing lesions are seen bilaterally suggesting chronic and active disease. Significant progression and worsening compared with noncontrast MRI 05/19/10.  CSF showed elevated Ig G index, 9 ocb, extensive lab test for mimic were negative, including HIV, RPR, b12 b6, ACE lyme SSA, B, CBC, CMP, PEP,  ana, copper, mild ele crp 6.5, and very low vit D<4.  [name] Was started in February 2012, she responded very well to tysabri, no side effect noticed, she is receiving it Thursday 5 PM every 28 days, she continues to has mild gait difficulty, she can only walk half miles each time, her legs would give out with prolonged walking, no upper extremity symptoms, no visual loss,  Repeat MRI of the brain 10/15/11 with slight improvement over previous study from 06/22/10.Prior enhancing lesions have resolved. Some of the chronic plaques have slightly decreased in size. No new complaints. 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  Repeat MRI brain in 05/2012, shows multiple infratentorial and supratentorial white matter hyperintensities typical for multiple sclerosis. No enhancing lesions are noted. The presence of several T1 black holes and atrophy of corpus callosum indicates chronic disease. Overall no significant changes compared with previous MRI dated 10/15/2011.  02/10/13. Returns for followup, doing well. Tysarbri infusion 2 days ago. She has not had recent labs. Repeat MRI of the brain 11/23/2012 with multiple supra-tentorial and infratentorial chronic demyelinating plaques. No acute demyelinating plaques. No significant change from  MRI on 05/28/2013. She denies any visual problems, any focal weakness, any sensory changes, any speech or swallowing difficulties. She denies any bowel or bladder issues.     REVIEW OF SYSTEMS: Full  14 system review of systems performed and notable only for those listed, all others are neg:  Constitutional: N/A  Cardiovascular: N/A  Ear/Nose/Throat: N/A  Skin: N/A  Eyes: N/A  Respiratory: N/A  Gastroitestinal: N/A  Hematology/Lymphatic: N/A  Endocrine: N/A Musculoskeletal:N/A  Allergy/Immunology: N/A  Neurological: N/A Psychiatric: N/A   ALLERGIES: No Known Allergies  HOME MEDICATIONS: Outpatient Prescriptions Prior to Visit  Medication Sig Dispense Refill  . atenolol (TENORMIN) 50 MG tablet Take 50 mg by mouth daily.        . calcium citrate (CALCITRATE - DOSED IN MG ELEMENTAL CALCIUM) 950 MG tablet Take 1 tablet by mouth daily.        Marland Kitchen METOPROLOL TARTRATE PO Take by mouth as directed.      . sodium chloride 0.9 % SOLN 100 mL with natalizumab 300 MG/15ML CONC 300 mg Inject 300 mg into the vein.       No facility-administered medications prior to visit.    PAST MEDICAL HISTORY: Past Medical History  Diagnosis Date  . MS (multiple sclerosis)   . Hypertension   . Abnormality of gait   . Other nonspecific abnormal result of function study of brain and central nervous system   . Personal history of fall     PAST SURGICAL HISTORY: Past Surgical History  Procedure Laterality Date  . Cholecystectomy    . Gallbladder surgery      FAMILY HISTORY: Family History  Problem Relation Age of Onset  . Cancer    . High blood pressure      SOCIAL HISTORY: History   Social History  . Marital Status: Single    Spouse Name: N/A    Number of Children: 2  . 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 Smoking status: Never Smoker   . Smokeless tobacco: Never Used  . Alcohol Use: No  . Drug Use: No  . Sexual Activity: Not on file   Other Topics Concern  . Not on file   Social History Narrative   Patient has a high school education and two children. Patient is not currently working.   Patient is  right-handed.   Patient drinks three cups of tea daily and two cans of soda daily.     PHYSICAL EXAM  Filed Vitals:   05/17/13 1430  BP: 143/100  Pulse: 84  Height: 5' 3.5" (1.613 m)  Weight: 176 lb (79.833 kg)   Body mass index is 30.68 kg/(m^2).  Generalized: Well developed, in no acute distress  Head: normocephalic and atraumatic,. Oropharynx benign  Neck: Supple, no carotid bruits  Cardiac: Regular rate rhythm, no murmur  Musculoskeletal: No deformity   Neurological examination   Mentation: Alert oriented to time, place, history taking. Follows all commands speech and language fluent  Cranial nerve II-XII: Visual acuity 20/70 bilaterally .Pupils were equal round reactive to light extraocular movements were full, visual field were full on confrontational test. Facial sensation and strength were normal. hearing was intact to finger rubbing bilaterally. Uvula tongue midline. head turning and shoulder shrug were normal and symmetric.Tongue protrusion into cheek strength was normal. Motor: normal bulk and tone, mild weakness in the upper extremities, mild spasticity both lower extremities and mild weakness  hip flexors.  Sensory: normal and symmetric to light touch, pinprick, and  vibration  Coordination: finger-nose-finger, heel-to-shin bilaterally, no dysmetria Reflexes: Brachioradialis 2/2, biceps 2/2, triceps 2/2, patellar 2/2, Achilles 2/2, plantar responses were flexor bilaterally. Gait and Station: Rising up from seated position without assistance, normal stance,  moderate stride, good arm swing, smooth turning, able to perform tiptoe, and heel walking without difficulty. Mild difficulty with tandem gait. DIAGNOSTIC DATA (LABS, IMAGING, TESTING) - I reviewed patient records, labs, notes, testing and imaging myself where available.  Lab Results  Component Value Date   WBC 9.7 02/10/2013   HGB 12.0 02/10/2013   HCT 35.4 02/10/2013   MCV 85 02/10/2013   PLT 340 05/18/2010       Component Value Date/Time   NA 142 02/10/2013 0940   NA 145 05/18/2010 1900   K 5.2 02/10/2013 0940   CL 104 02/10/2013 0940   CO2 28 02/10/2013 0940   GLUCOSE 86 02/10/2013 0940   GLUCOSE 84 05/18/2010 1900   BUN 10 02/10/2013 0940   BUN 13 05/18/2010 1900   CREATININE 0.82 02/10/2013 0940   CALCIUM 9.7 02/10/2013 0940   PROT 7.1 02/10/2013 0940   PROT 8.5* 05/18/2010 1900   ALBUMIN 4.7 05/18/2010 1900   AST 13 02/10/2013 0940   ALT 9 02/10/2013 0940   ALKPHOS 91 02/10/2013 0940   BILITOT 0.4 02/10/2013 0940   GFRNONAA 94 02/10/2013 0940   GFRAA 108 02/10/2013 0940   ASSESSMENT AND PLAN  32 y.o. year old female  has a past medical history of MS (multiple sclerosis); Hypertension; Abnormality of gait; Other nonspecific abnormal result of function study of brain and central nervous system; and Personal history of fall. here to followup for multiple sclerosis. Discussed with Dr. Johnella Moloney virus with index MRI of the brain already ordered F/U in 6 months Next Tsyabri scheduled 05/26/13  Nilda Riggs, [facility], [address] [facility], APRN  [facility] [address], [address] [place], [place] [address] [phone]

## 2013-05-17 NOTE — Patient Instructions (Signed)
JC virus with antibody MRI of the brain already ordered F/U in 6 months

## 2013-05-24 ENCOUNTER — Ambulatory Visit
Admission: RE | Admit: 2013-05-24 | Discharge: 2013-05-24 | Disposition: A | Discharge status: Home / Self Care | Payer: Medicaid Other | Source: Ambulatory Visit | Attending: Neurology | Admitting: Neurology

## 2013-05-24 DIAGNOSIS — R269 Unspecified abnormalities of gait and mobility: Secondary | ICD-10-CM

## 2013-05-24 DIAGNOSIS — G35 Multiple sclerosis: Secondary | ICD-10-CM

## 2013-05-24 MED ORDER — GADOBENATE DIMEGLUMINE 529 MG/ML IV SOLN
15.0000 mL | Freq: Once | INTRAVENOUS | Status: AC | PRN
  Administered 2013-05-24: 15 mL via INTRAVENOUS

## 2013-05-26 ENCOUNTER — Encounter (HOSPITAL_COMMUNITY): Payer: Self-pay

## 2013-05-26 ENCOUNTER — Encounter (HOSPITAL_COMMUNITY)
Admission: RE | Admit: 2013-05-26 | Discharge: 2013-05-26 | Disposition: A | Discharge status: Home / Self Care | Payer: Medicaid Other | Source: Ambulatory Visit | Attending: Neurology | Admitting: Neurology

## 2013-05-26 VITALS — BP 119/82 | HR 67 | Temp 98.0°F | Resp 16 | Ht 63.5 in | Wt 176.0 lb

## 2013-05-26 DIAGNOSIS — G35 Multiple sclerosis: Secondary | ICD-10-CM | POA: Insufficient documentation

## 2013-05-26 MED ORDER — SODIUM CHLORIDE 0.9 % IV SOLN
INTRAVENOUS | Status: DC
  Administered 2013-05-26: 09:00:00 via INTRAVENOUS

## 2013-05-26 MED ORDER — ACETAMINOPHEN 500 MG PO TABS
1000.0000 mg | ORAL_TABLET | ORAL | Status: DC
  Administered 2013-05-26: 1000 mg via ORAL
  Filled 2013-05-26: qty 2

## 2013-05-26 MED ORDER — LORATADINE 10 MG PO TABS
10.0000 mg | ORAL_TABLET | ORAL | Status: DC
  Administered 2013-05-26: 10 mg via ORAL
  Filled 2013-05-26: qty 1

## 2013-05-26 MED ORDER — SODIUM CHLORIDE 0.9 % IV SOLN
300.0000 mg | INTRAVENOUS | Status: DC
  Administered 2013-05-26: 300 mg via INTRAVENOUS
  Filled 2013-05-26: qty 15

## 2013-06-07 NOTE — Progress Notes (Signed)
Quick Note:  Left message that MRI brain shows evidence of MS, but no change compared to scan in June 2014, per Dr. Terrace ArabiaYan. Told to call with further questions. ______

## 2013-06-07 NOTE — Progress Notes (Signed)
Quick Note:  Please call patient, MRI brain continues to show evidence of MS, no change compared to previous scan in June 2014. ______

## 2013-06-08 ENCOUNTER — Telehealth: Payer: Self-pay | Admitting: Nurse Practitioner

## 2013-06-08 NOTE — Progress Notes (Signed)
JC antibody positive with index of 1.17 on 05/17/13

## 2013-06-08 NOTE — Telephone Encounter (Signed)
Patient called  05/17/13 index value was 1.17 JC Antibody is positive MRI of the brain to be repeated in June last was done 05/2013 with out change from previous. Patient understands

## 2013-06-23 ENCOUNTER — Encounter (HOSPITAL_COMMUNITY)
Admission: RE | Admit: 2013-06-23 | Discharge: 2013-06-23 | Disposition: A | Discharge status: Home / Self Care | Payer: Medicaid Other | Source: Ambulatory Visit | Attending: Neurology | Admitting: Neurology

## 2013-06-23 ENCOUNTER — Encounter (HOSPITAL_COMMUNITY): Payer: Self-pay

## 2013-06-23 VITALS — BP 131/85 | HR 72 | Temp 98.3°F | Resp 16

## 2013-06-23 DIAGNOSIS — G35 Multiple sclerosis: Secondary | ICD-10-CM | POA: Insufficient documentation

## 2013-06-23 MED ORDER — LORATADINE 10 MG PO TABS
10.0000 mg | ORAL_TABLET | ORAL | Status: AC
  Administered 2013-06-23: 10 mg via ORAL
  Filled 2013-06-23: qty 1

## 2013-06-23 MED ORDER — SODIUM CHLORIDE 0.9 % IV SOLN
300.0000 mg | INTRAVENOUS | Status: AC
  Administered 2013-06-23: 300 mg via INTRAVENOUS
  Filled 2013-06-23: qty 15

## 2013-06-23 MED ORDER — ACETAMINOPHEN 500 MG PO TABS
1000.0000 mg | ORAL_TABLET | ORAL | Status: AC
  Administered 2013-06-23: 1000 mg via ORAL
  Filled 2013-06-23: qty 2

## 2013-06-23 MED ORDER — SODIUM CHLORIDE 0.9 % IV SOLN
INTRAVENOUS | Status: AC
  Administered 2013-06-23: 08:00:00 via INTRAVENOUS

## 2013-06-23 NOTE — Discharge Instructions (Signed)

## 2013-06-23 NOTE — Progress Notes (Signed)
Post- Tysabri tranfusion, pt. Declined to stay recommend one-hour, pt. To follow-up with doctor with any problems.

## 2013-07-20 ENCOUNTER — Telehealth: Payer: Self-pay | Admitting: *Deleted

## 2013-07-20 NOTE — Telephone Encounter (Signed)
Lillia AbedLindsay calling about tysabri orders needed for tomorrow at 1100.

## 2013-07-21 ENCOUNTER — Encounter (HOSPITAL_COMMUNITY): Payer: Self-pay

## 2013-07-21 ENCOUNTER — Other Ambulatory Visit: Payer: Self-pay | Admitting: *Deleted

## 2013-07-21 ENCOUNTER — Other Ambulatory Visit: Payer: Self-pay | Admitting: Neurology

## 2013-07-21 ENCOUNTER — Encounter (HOSPITAL_COMMUNITY)
Admission: RE | Admit: 2013-07-21 | Discharge: 2013-07-21 | Disposition: A | Discharge status: Home / Self Care | Payer: Medicaid Other | Source: Ambulatory Visit | Attending: Neurology | Admitting: Neurology

## 2013-07-21 VITALS — BP 134/90 | HR 75 | Temp 97.4°F | Resp 18

## 2013-07-21 DIAGNOSIS — G35 Multiple sclerosis: Secondary | ICD-10-CM | POA: Insufficient documentation

## 2013-07-21 MED ORDER — LORATADINE 10 MG PO TABS
10.0000 mg | ORAL_TABLET | Freq: Once | ORAL | Status: AC
  Administered 2013-07-21: 10 mg via ORAL
  Filled 2013-07-21: qty 1

## 2013-07-21 MED ORDER — ACETAMINOPHEN 500 MG PO TABS
1000.0000 mg | ORAL_TABLET | Freq: Once | ORAL | Status: AC
  Administered 2013-07-21: 1000 mg via ORAL
  Filled 2013-07-21: qty 2

## 2013-07-21 MED ORDER — SODIUM CHLORIDE 0.9 % IV SOLN
INTRAVENOUS | Status: DC
  Administered 2013-07-21: 11:00:00 via INTRAVENOUS

## 2013-07-21 MED ORDER — NATALIZUMAB 300 MG/15ML IV CONC
300.0000 mg | INTRAVENOUS | Status: DC
  Administered 2013-07-21: 300 mg via INTRAVENOUS
  Filled 2013-07-21: qty 15

## 2013-07-21 NOTE — Telephone Encounter (Signed)
Tysabri order is placed.

## 2013-08-16 ENCOUNTER — Telehealth: Payer: Self-pay | Admitting: Neurology

## 2013-08-16 NOTE — Telephone Encounter (Signed)
Spoke to patient and relayed that we have [place] [employer].   Julie Pope was going to refax it today and I will check with medical records to make sure it has been received.

## 2013-08-16 NOTE — Telephone Encounter (Signed)
Pt called to check the status of her long term disability paperwork. She was told that their office had originally sent it a month ago and they are resending it today.  This paperwork is due back 08-19-13 in order to keep her disability.  She asked if it would be possible for it to be rushed.  Please call.  Thank you

## 2013-08-18 NOTE — Telephone Encounter (Signed)
Spoke with patient and will fill out form and have it faxed tomorrow.

## 2013-08-18 NOTE — Telephone Encounter (Signed)
Patient need her form .

## 2013-08-19 ENCOUNTER — Encounter (HOSPITAL_COMMUNITY): Payer: Self-pay

## 2013-08-19 ENCOUNTER — Encounter (HOSPITAL_COMMUNITY)
Admission: RE | Admit: 2013-08-19 | Discharge: 2013-08-19 | Disposition: A | Discharge status: Home / Self Care | Payer: Medicaid Other | Source: Ambulatory Visit | Attending: Neurology | Admitting: Neurology

## 2013-08-19 VITALS — BP 150/101 | HR 71 | Temp 97.8°F | Resp 18

## 2013-08-19 DIAGNOSIS — G35 Multiple sclerosis: Secondary | ICD-10-CM | POA: Insufficient documentation

## 2013-08-19 MED ORDER — NATALIZUMAB 300 MG/15ML IV CONC
300.0000 mg | INTRAVENOUS | Status: DC
  Administered 2013-08-19: 300 mg via INTRAVENOUS
  Filled 2013-08-19: qty 15

## 2013-08-19 MED ORDER — LORATADINE 10 MG PO TABS
10.0000 mg | ORAL_TABLET | Freq: Once | ORAL | Status: AC
  Administered 2013-08-19: 10 mg via ORAL
  Filled 2013-08-19: qty 1

## 2013-08-19 MED ORDER — ACETAMINOPHEN 500 MG PO TABS
1000.0000 mg | ORAL_TABLET | Freq: Once | ORAL | Status: AC
  Administered 2013-08-19: 1000 mg via ORAL
  Filled 2013-08-19: qty 2

## 2013-08-19 MED ORDER — SODIUM CHLORIDE 0.9 % IV SOLN
INTRAVENOUS | Status: AC
  Administered 2013-08-19: 11:00:00 via INTRAVENOUS

## 2013-08-19 NOTE — Discharge Instructions (Signed)
Tysabri °Natalizumab injection °What is this medicine? °NATALIZUMAB (na ta LIZ you mab) is used to treat relapsing multiple sclerosis. This drug is not a cure. It is also used to treat Crohn's disease. °This medicine may be used for other purposes; ask your health care provider or pharmacist if you have questions. °COMMON BRAND NAME(S): Tysabri °What should I tell my health care provider before I take this medicine? °They need to know if you have any of these conditions: °-immune system problems °-progressive multifocal leukoencephalopathy (PML) °-an unusual or allergic reaction to natalizumab, other medicines, foods, dyes, or preservatives °-pregnant or trying to get pregnant °-breast-feeding °How should I use this medicine? °This medicine is for infusion into a vein. It is given by a health care professional in a hospital or clinic setting. °A special MedGuide will be given to you by the pharmacist with each prescription and refill. Be sure to read this information carefully each time. °Talk to your pediatrician regarding the use of this medicine in children. This medicine is not approved for use in children. °Overdosage: If you think you have taken too much of this medicine contact a poison control center or emergency room at once. °NOTE: This medicine is only for you. Do not share this medicine with others. °What if I miss a dose? °It is important not to miss your dose. Call your doctor or health care professional if you are unable to keep an appointment. °What may interact with this medicine? °-azathioprine °-cyclosporine °-interferon °-6-mercaptopurine °-methotrexate °-steroid medicines like prednisone or cortisone °-TNF-alpha inhibitors like adalimumab, etanercept, and infliximab °-vaccines °This list may not describe all possible interactions. Give your health care provider a list of all the medicines, herbs, non-prescription drugs, or dietary supplements you use. Also tell them if you smoke, drink alcohol,  or use illegal drugs. Some items may interact with your medicine. °What should I watch for while using this medicine? °Your condition will be monitored carefully while you are receiving this medicine. Visit your doctor for regular check ups. Tell your doctor or healthcare professional if your symptoms do not start to get better or if they get worse. °Stay away from people who are sick. Call your doctor or health care professional for advice if you get a fever, chills or sore throat, or other symptoms of a cold or flu. Do not treat yourself. °In some patients, this medicine may cause a serious brain infection that may cause death. If you have any problems seeing, thinking, speaking, walking, or standing, tell your doctor right away. If you cannot reach your doctor, get urgent medical care. °What side effects may I notice from receiving this medicine? °Side effects that you should report to your doctor or health care professional as soon as possible: °-allergic reactions like skin rash, itching or hives, swelling of the face, lips, or tongue °-breathing problems °-changes in vision °-chest pain °-dark urine °-depression, feelings of sadness °-dizziness °-general ill feeling or flu-like symptoms °-irregular, missed, or painful menstrual periods °-light-colored stools °-loss of appetite, nausea °-muscle weakness °-problems with balance, talking, or walking °-right upper belly pain °-unusually weak or tired °-yellowing of the eyes or skin °Side effects that usually do not require medical attention (report to your doctor or health care professional if they continue or are bothersome): °-aches, pains °-headache °-stomach upset °-tiredness °This list may not describe all possible side effects. Call your doctor for medical advice about side effects. You may report side effects to FDA at 1-800-FDA-1088. °Where should I   keep my medicine? °This drug is given in a hospital or clinic and will not be stored at home. °NOTE: This  sheet is a summary. It may not cover all possible information. If you have questions about this medicine, talk to your doctor, pharmacist, or health care provider. °© 2014, [employer]. (2008-07-09 13:33:21) ° °

## 2013-08-19 NOTE — Progress Notes (Signed)
Patient received recommended 20cc NS post Tysabri infusion. Declined to stay for full hour observation. Aware to call MD with any problems.

## 2013-09-17 ENCOUNTER — Encounter (HOSPITAL_COMMUNITY): Payer: Self-pay

## 2013-09-17 ENCOUNTER — Encounter (HOSPITAL_COMMUNITY)
Admission: RE | Admit: 2013-09-17 | Discharge: 2013-09-17 | Disposition: A | Discharge status: Home / Self Care | Payer: Medicaid Other | Source: Ambulatory Visit | Attending: Neurology | Admitting: Neurology

## 2013-09-17 VITALS — BP 126/91 | HR 78 | Temp 97.9°F | Resp 16

## 2013-09-17 DIAGNOSIS — G35 Multiple sclerosis: Secondary | ICD-10-CM

## 2013-09-17 MED ORDER — ACETAMINOPHEN 500 MG PO TABS
1000.0000 mg | ORAL_TABLET | ORAL | Status: DC
  Administered 2013-09-17: 1000 mg via ORAL
  Filled 2013-09-17: qty 2

## 2013-09-17 MED ORDER — SODIUM CHLORIDE 0.9 % IV SOLN
300.0000 mg | INTRAVENOUS | Status: AC
  Administered 2013-09-17: 300 mg via INTRAVENOUS
  Filled 2013-09-17: qty 15

## 2013-09-17 MED ORDER — SODIUM CHLORIDE 0.9 % IV SOLN
INTRAVENOUS | Status: DC
  Administered 2013-09-17: 10:00:00 via INTRAVENOUS

## 2013-09-17 MED ORDER — LORATADINE 10 MG PO TABS
10.0000 mg | ORAL_TABLET | ORAL | Status: DC
  Administered 2013-09-17: 10 mg via ORAL
  Filled 2013-09-17: qty 1

## 2013-10-11 ENCOUNTER — Encounter: Payer: Self-pay | Admitting: Nurse Practitioner

## 2013-10-15 ENCOUNTER — Telehealth: Payer: Self-pay | Admitting: Neurology

## 2013-10-15 NOTE — Telephone Encounter (Signed)
Janalee Dane with Short Stay @ [phone] needs an order for [place]..  Thanks

## 2013-10-18 ENCOUNTER — Encounter (HOSPITAL_COMMUNITY): Payer: Medicaid Other

## 2013-10-18 ENCOUNTER — Other Ambulatory Visit: Payer: Self-pay | Admitting: Neurology

## 2013-10-18 DIAGNOSIS — G35 Multiple sclerosis: Secondary | ICD-10-CM

## 2013-10-18 NOTE — Telephone Encounter (Signed)
Please place order for Tysabri in Epic[facility].

## 2013-10-18 NOTE — Telephone Encounter (Signed)
Order was placed for IV  Tysarbri infusion,

## 2013-10-19 ENCOUNTER — Encounter (HOSPITAL_COMMUNITY)
Admission: RE | Admit: 2013-10-19 | Discharge: 2013-10-19 | Disposition: A | Discharge status: Home / Self Care | Payer: Medicaid Other | Source: Ambulatory Visit | Attending: Neurology | Admitting: Neurology

## 2013-10-19 ENCOUNTER — Encounter (HOSPITAL_COMMUNITY): Payer: Self-pay

## 2013-10-19 VITALS — BP 130/89 | HR 67 | Temp 97.9°F | Resp 16 | Ht 63.5 in | Wt 176.0 lb

## 2013-10-19 DIAGNOSIS — G35 Multiple sclerosis: Secondary | ICD-10-CM | POA: Insufficient documentation

## 2013-10-19 MED ORDER — SODIUM CHLORIDE 0.9 % IV SOLN
300.0000 mg | INTRAVENOUS | Status: DC
  Administered 2013-10-19: 300 mg via INTRAVENOUS
  Filled 2013-10-19: qty 15

## 2013-10-19 MED ORDER — SODIUM CHLORIDE 0.9 % IV SOLN
INTRAVENOUS | Status: DC
  Administered 2013-10-19: 11:00:00 via INTRAVENOUS

## 2013-10-19 MED ORDER — ACETAMINOPHEN 500 MG PO TABS
1000.0000 mg | ORAL_TABLET | ORAL | Status: DC
  Administered 2013-10-19: 1000 mg via ORAL
  Filled 2013-10-19: qty 2

## 2013-10-19 MED ORDER — LORATADINE 10 MG PO TABS
10.0000 mg | ORAL_TABLET | ORAL | Status: DC
  Administered 2013-10-19: 10 mg via ORAL
  Filled 2013-10-19 (×2): qty 1

## 2013-10-19 NOTE — Progress Notes (Signed)
Pt stayed only 10 minutes of the recommended 1 hour post infusion observation. Pt states she will contact her MD if there are any questions or concerns

## 2013-11-15 ENCOUNTER — Ambulatory Visit: Payer: Medicaid Other | Admitting: Nurse Practitioner

## 2013-11-16 ENCOUNTER — Encounter (HOSPITAL_COMMUNITY): Payer: Self-pay

## 2013-11-25 ENCOUNTER — Encounter (HOSPITAL_COMMUNITY): Payer: Self-pay

## 2013-11-25 ENCOUNTER — Encounter (HOSPITAL_COMMUNITY)
Admission: RE | Admit: 2013-11-25 | Discharge: 2013-11-25 | Disposition: A | Discharge status: Home / Self Care | Payer: Self-pay | Source: Ambulatory Visit | Attending: Neurology | Admitting: Neurology

## 2013-11-25 VITALS — BP 126/94 | HR 81 | Temp 98.3°F | Resp 18

## 2013-11-25 DIAGNOSIS — G35 Multiple sclerosis: Secondary | ICD-10-CM | POA: Insufficient documentation

## 2013-11-25 MED ORDER — SODIUM CHLORIDE 0.9 % IV SOLN
300.0000 mg | INTRAVENOUS | Status: DC
  Administered 2013-11-25: 300 mg via INTRAVENOUS
  Filled 2013-11-25: qty 15

## 2013-11-25 MED ORDER — LORATADINE 10 MG PO TABS
10.0000 mg | ORAL_TABLET | ORAL | Status: DC
  Administered 2013-11-25: 10 mg via ORAL
  Filled 2013-11-25: qty 1

## 2013-11-25 MED ORDER — ACETAMINOPHEN 500 MG PO TABS
1000.0000 mg | ORAL_TABLET | ORAL | Status: DC
  Administered 2013-11-25: 1000 mg via ORAL
  Filled 2013-11-25: qty 2

## 2013-11-25 MED ORDER — SODIUM CHLORIDE 0.9 % IV SOLN
INTRAVENOUS | Status: DC
  Administered 2013-11-25: 10:00:00 via INTRAVENOUS

## 2013-11-26 ENCOUNTER — Encounter: Payer: Self-pay | Admitting: Nurse Practitioner

## 2013-11-26 ENCOUNTER — Ambulatory Visit (INDEPENDENT_AMBULATORY_CARE_PROVIDER_SITE_OTHER): Payer: Self-pay | Admitting: Nurse Practitioner

## 2013-11-26 VITALS — BP 155/100 | HR 69 | Ht 63.0 in | Wt 181.0 lb

## 2013-11-26 DIAGNOSIS — R269 Unspecified abnormalities of gait and mobility: Secondary | ICD-10-CM

## 2013-11-26 DIAGNOSIS — G35 Multiple sclerosis: Secondary | ICD-10-CM

## 2013-11-26 DIAGNOSIS — Z5181 Encounter for therapeutic drug level monitoring: Secondary | ICD-10-CM

## 2013-11-26 LAB — CBC WITH DIFFERENTIAL/PLATELET
Basophils Absolute: 0 10*3/uL (ref 0.0–0.2)
Basos: 0 %
Eos: 1 %
Eosinophils Absolute: 0.1 10*3/uL (ref 0.0–0.4)
HCT: 33.8 % — ABNORMAL LOW (ref 34.0–46.6)
HEMOGLOBIN: 11.5 g/dL (ref 11.1–15.9)
Lymphocytes Absolute: 3.5 10*3/uL — ABNORMAL HIGH (ref 0.7–3.1)
Lymphs: 35 %
MCH: 28.5 pg (ref 26.6–33.0)
MCHC: 34 g/dL (ref 31.5–35.7)
MCV: 84 fL (ref 79–97)
Monocytes Absolute: 0.9 10*3/uL (ref 0.1–0.9)
Monocytes: 9 %
NEUTROS ABS: 5.4 10*3/uL (ref 1.4–7.0)
Neutrophils Relative %: 55 %
RBC: 4.04 x10E6/uL (ref 3.77–5.28)
RDW: 13.8 % (ref 12.3–15.4)
WBC: 9.9 10*3/uL (ref 3.4–10.8)

## 2013-11-26 LAB — COMPREHENSIVE METABOLIC PANEL
ALT: 8 IU/L (ref 0–32)
AST: 12 IU/L (ref 0–40)
Albumin/Globulin Ratio: 1.4 (ref 1.1–2.5)
Albumin: 4.3 g/dL (ref 3.5–5.5)
Alkaline Phosphatase: 90 IU/L (ref 39–117)
BUN/Creatinine Ratio: 13 (ref 8–20)
BUN: 11 mg/dL (ref 6–20)
CALCIUM: 10 mg/dL (ref 8.7–10.2)
CHLORIDE: 102 mmol/L (ref 96–108)
CO2: 28 mmol/L (ref 18–29)
Creatinine, Ser: 0.85 mg/dL (ref 0.57–1.00)
GFR calc Af Amer: 103 mL/min/{1.73_m2} (ref >59)
GFR calc non Af Amer: 90 mL/min/{1.73_m2} (ref >59)
Globulin, Total: 3.1 g/dL (ref 1.5–4.5)
Glucose: 87 mg/dL (ref 65–99)
Potassium: 4.5 mmol/L (ref 3.5–5.2)
SODIUM: 138 mmol/L (ref 134–144)
Total Bilirubin: 0.4 mg/dL (ref 0.0–1.2)
Total Protein: 7.4 g/dL (ref 6.0–8.5)

## 2013-11-26 NOTE — Progress Notes (Signed)
[facility]  PATIENT: Julie Pope DOB: Apr 28, 1981   REASON FOR VISIT: for relapsing remitting MS   HISTORY OF PRESENT ILLNESS: Julie Pope 33 year old female returns for followup. She was last seen 05/17/2013. She has a history of multiple sclerosis and is currently on Tysabri monthly. She received a dose yesterday. Most recent MRI of the brain with and without contrast in January without change from previous 11/23/2012. JC virus index 05/17/13 index value was 1.17 JC Antibody is positive.  She denies any visual symptoms, she has not fallen. She does have some bladder urgency frequency and wears depends at times. She denies any significant weakness, any sensory changes any speech or swallowing problems. She returns for reevaluation. She is currently not working as her job was eliminated at [employer] [place].  HISTORY:She has history of hypertension, in May 18, 2010,while walking towards her car at the end of her working day, her legs give out underneath her, she fell face down on the concrete pavement at her Parking lot infront of [employer] [place], knocked out her front teeth.  She denied loss of consciousness, she denies incontinence, and bilateral upper extremity weakness, denied bulbar weakness, no visual change.  She is referred for MRI of the brain,in May 19, 2010 without contrast, demonstrated diffuse white matter changes throughout the supratentorial and infratentorial region, with corpus callosum involvement, MRI scan of the cervical spine shows demyelinating enhancing plaques at C 3 on right and C 4-5 on left. consistent with MS  Repeat MRI scan of the brain in 2012, showed multiple brainstem, cerebellar, subcortical, corpus callosal and periventricular white matter lesions typical for demyelinating disease. Several T 1 black holes and enhancing lesions are seen bilaterally suggesting chronic and active disease. Significant progression and worsening compared  with noncontrast MRI 05/19/10.  CSF showed elevated Ig G index, 9 ocb, extensive lab test for mimic were negative, including HIV, RPR, b12 b6, ACE lyme SSA, B, CBC, CMP, PEP, ana, copper, mild ele crp 6.5, and very low vit D<4.  [name] Was started in February 2012, she responded very well to tysabri, no side effect noticed, she is receiving it Thursday 5 PM every 28 days, she continues to has mild gait difficulty, she can only walk half miles each time, her legs would give out with prolonged walking, no upper extremity symptoms, no visual loss,  Repeat MRI of the brain 10/15/11 with slight improvement over previous study from 06/22/10.Prior enhancing lesions have resolved. Some of the chronic plaques have slightly decreased in size. No new complaints. 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  Repeat MRI brain in 05/2012, shows multiple infratentorial and supratentorial white matter hyperintensities typical for multiple sclerosis. No enhancing lesions are noted. The presence of several T1 black holes and atrophy of corpus callosum indicates chronic disease. Overall no significant changes compared with previous MRI dated 10/15/2011.  02/10/13. Returns for followup, doing well. Tysarbri infusion 2 days ago. She has not had recent labs.  Repeat MRI of the brain 11/23/2012 with multiple supra-tentorial and infratentorial chronic demyelinating plaques. No acute demyelinating plaques. No significant change from MRI on 05/28/2013. She  denies any visual problems, any focal weakness, any sensory changes, any speech or swallowing difficulties. She denies any bowel or bladder issues.    REVIEW OF SYSTEMS: Full 14 system review of systems performed and notable only for those listed, all others are neg:  Constitutional: N/A  Cardiovascular: N/A  Ear/Nose/Throat: N/A  Skin: N/A  Eyes: N/A  Respiratory: N/A  Gastroitestinal: N/A  Hematology/Lymphatic: N/A  Endocrine: N/A Musculoskeletal:N/A  Allergy/Immunology: N/A  Neurological: N/A Psychiatric: N/A Sleep : NA   ALLERGIES: No Known Allergies  HOME MEDICATIONS: Outpatient Prescriptions Prior to Visit  Medication Sig Dispense Refill  . atenolol (TENORMIN) 50 MG tablet Take 50 mg by mouth daily.        . calcium citrate (CALCITRATE - DOSED IN MG ELEMENTAL CALCIUM) 950 MG tablet Take 1 tablet by mouth daily.        . sodium chloride 0.9 % SOLN 100 mL with natalizumab 300 MG/15ML CONC 300 mg Inject 300 mg into the vein.       No facility-administered medications prior to visit.    PAST MEDICAL HISTORY: Past Medical History  Diagnosis Date  . MS (multiple sclerosis)   . Hypertension   . Abnormality of gait   . Other nonspecific abnormal result of function study of brain and central nervous system   . Personal history of fall     PAST SURGICAL HISTORY: Past Surgical History  Procedure Laterality Date  . Cholecystectomy    . Gallbladder surgery      FAMILY HISTORY: Family History  Problem Relation Age of Onset  . Cancer Maternal Grandmother   . High blood pressure      Runs in both sides of the family    SOCIAL HISTORY: History   Social History  . Marital Status: Single    Spouse Name: N/A    Number of Children: 2  . Years of Education: 12   Occupational History  . FILE CLERK [employer]   Social History Main Topics  . Smoking status: Never Smoker   . Smokeless tobacco: Never Used  . Alcohol Use: No  . Drug Use: No  . Sexual  Activity: Not on file   Other Topics Concern  . Not on file   Social History Narrative   Patient has a high school education and two children. Patient is not currently working.   Patient is right-handed.   Patient drinks three cups of tea daily and two cans of soda daily.     PHYSICAL EXAM  Filed Vitals:   11/26/13 0916  BP: 155/100  Pulse: 69  Height: 5\' 3"  (1.6 m)  Weight: 181 lb (82.101 kg)   Body mass index is 32.07 kg/(m^2). Generalized: Well developed, in no acute distress  Head: normocephalic and atraumatic,. Oropharynx benign  Neck: Supple, no carotid bruits  Cardiac: Regular rate rhythm, no murmur  Musculoskeletal: No deformity   Neurological examination  Mentation: Alert oriented to time, place, history taking. Follows all commands speech and language fluent  Cranial nerve II-XII: Visual acuity 20/40 bilaterally .Pupils were equal round reactive to light extraocular movements were full, visual field were full on confrontational test. Facial sensation and strength were normal. hearing was intact to finger rubbing bilaterally. Uvula tongue midline. head turning and shoulder shrug were normal and symmetric.Tongue protrusion into cheek strength was normal.  Motor: normal bulk and tone, mild weakness in the upper extremities, mild spasticity both lower extremities and mild weakness hip flexors.  Sensory: normal and symmetric to light touch, pinprick, and vibration  Coordination: finger-nose-finger, heel-to-shin bilaterally, no dysmetria  Reflexes: Brachioradialis 2/2, biceps 2/2, triceps 2/2, patellar 2/2, Achilles 2/2, plantar responses were flexor bilaterally.  Gait and Station: Rising up from seated position without assistance, normal stance, moderate stride, good arm swing, smooth turning, able to perform tiptoe, and heel walking without difficulty. Tandem gait steady no assistive device.   DIAGNOSTIC DATA (LABS, IMAGING, TESTING) - I reviewed patient records, labs,  notes, testing and imaging myself where available.  Lab Results  Component Value Date   WBC 9.7 02/10/2013   HGB 12.0 02/10/2013   HCT 35.4 02/10/2013   MCV 85 02/10/2013   PLT 340 05/18/2010      Component Value Date/Time   NA 142 02/10/2013 0940   NA 145 05/18/2010 1900   K 5.2 02/10/2013 0940   CL 104 02/10/2013 0940   CO2 28 02/10/2013 0940   GLUCOSE 86 02/10/2013 0940   GLUCOSE 84 05/18/2010 1900   BUN 10 02/10/2013 0940   BUN 13 05/18/2010 1900   CREATININE 0.82 02/10/2013 0940   CALCIUM 9.7 02/10/2013 0940   PROT 7.1 02/10/2013 0940   PROT 8.5* 05/18/2010 1900   ALBUMIN 4.7 05/18/2010 1900   AST 13 02/10/2013 0940   ALT 9 02/10/2013 0940   ALKPHOS 91 02/10/2013 0940   BILITOT 0.4 02/10/2013 0940   GFRNONAA 94 02/10/2013 0940   GFRAA 108 02/10/2013 0940    ASSESSMENT AND PLAN  33 y.o. year old female  has a past medical history of MS (multiple sclerosis); Hypertension; Abnormality of gait; Other nonspecific abnormal result of function study of brain and central nervous system; her to followup for her multiple sclerosis. She is on Tysabri..05/17/13 index value was 1.17 JC Antibody is positive.   Repeat MRI of the brain with and without and compare 06/2013 CBC and CMP JC virus with index Continue Tysabri, last dose was 6/ 25/ 2015 Followup in 6 months Nilda Riggs, [facility], [facility], APRN  [facility] [address], [address] [place], [place] [address] [phone]

## 2013-11-26 NOTE — Progress Notes (Signed)
Patient tolerated treatment well. Unable to stay for full hour post infusion

## 2013-11-26 NOTE — Patient Instructions (Signed)
Repeat MRI of the brain with and without CBC and CMP JC virus with index Continue Tysabri, last dose is 6/ 25/ 2015 Followup in 6 months

## 2013-11-29 NOTE — Progress Notes (Signed)
Quick Note:  I called and LMVM for pt that her labs looked good per Enid Skeens, NP. Pt is to call back if questions. ______

## 2013-12-01 ENCOUNTER — Telehealth: Payer: Self-pay | Admitting: Nurse Practitioner

## 2013-12-01 NOTE — Telephone Encounter (Signed)
Discussed with Dr. Terrace Arabia. Called patient left message that JC antibody continues to increase slightly from 1.17 to 1.44 from 6 months ago. Need to repeat in 3 months. Keep appt for MRI of the brain for tomorrow. Next appt has been changed to be with Dr. Terrace Arabia. Call for questions

## 2013-12-02 ENCOUNTER — Inpatient Hospital Stay: Admission: RE | Admit: 2013-12-02 | Payer: Medicaid Other | Source: Ambulatory Visit

## 2013-12-02 ENCOUNTER — Ambulatory Visit
Admission: RE | Admit: 2013-12-02 | Discharge: 2013-12-02 | Disposition: A | Discharge status: Home / Self Care | Payer: Medicaid Other | Source: Ambulatory Visit | Attending: Nurse Practitioner | Admitting: Nurse Practitioner

## 2013-12-02 DIAGNOSIS — R269 Unspecified abnormalities of gait and mobility: Secondary | ICD-10-CM

## 2013-12-02 DIAGNOSIS — G35 Multiple sclerosis: Secondary | ICD-10-CM

## 2013-12-02 MED ORDER — GADOBENATE DIMEGLUMINE 529 MG/ML IV SOLN: 17.0000 mL | Freq: Once | INTRAVENOUS | Status: AC | PRN

## 2013-12-07 ENCOUNTER — Telehealth: Payer: Self-pay | Admitting: Nurse Practitioner

## 2013-12-07 NOTE — Telephone Encounter (Signed)
Pt was called about her MRI results.

## 2013-12-07 NOTE — Telephone Encounter (Signed)
Patient returning call for MRI results.  ?

## 2013-12-14 ENCOUNTER — Encounter (HOSPITAL_COMMUNITY): Payer: Self-pay

## 2013-12-15 ENCOUNTER — Telehealth: Payer: Self-pay | Admitting: Neurology

## 2013-12-15 NOTE — Telephone Encounter (Signed)
Called Julie Pope with Disability and left message asking her to refax the form over to our office.

## 2013-12-15 NOTE — Telephone Encounter (Signed)
[place] disability caseworker @ [phone], checking status of form requesting work restrictions faxed on 12/07/13.  Reference # [id number].  Please call and advise.

## 2013-12-16 NOTE — Telephone Encounter (Signed)
Pt's form was received and information given to Lupita Leash, RN, Dr. Zannie Cove nurse. Please advise

## 2013-12-24 NOTE — Telephone Encounter (Signed)
Spoke to [place] to relay we received the form and have a two week turn around time.  She is asking if patient can work 8 hours a day at a sedentary capacity and only lifting up to 10 lbs.  I explained the doctor is out but will address with her and complete form next week.

## 2013-12-27 ENCOUNTER — Encounter (HOSPITAL_COMMUNITY): Payer: Self-pay

## 2013-12-27 ENCOUNTER — Encounter (HOSPITAL_COMMUNITY)
Admission: RE | Admit: 2013-12-27 | Discharge: 2013-12-27 | Disposition: A | Discharge status: Home / Self Care | Payer: Self-pay | Source: Ambulatory Visit | Attending: Neurology | Admitting: Neurology

## 2013-12-27 VITALS — BP 114/93 | HR 74 | Temp 98.0°F | Resp 18

## 2013-12-27 DIAGNOSIS — G35 Multiple sclerosis: Secondary | ICD-10-CM | POA: Insufficient documentation

## 2013-12-27 MED ORDER — LORATADINE 10 MG PO TABS
10.0000 mg | ORAL_TABLET | ORAL | Status: AC
  Administered 2013-12-27: 10 mg via ORAL
  Filled 2013-12-27: qty 1

## 2013-12-27 MED ORDER — ACETAMINOPHEN 500 MG PO TABS
1000.0000 mg | ORAL_TABLET | ORAL | Status: AC
  Administered 2013-12-27: 1000 mg via ORAL
  Filled 2013-12-27: qty 2

## 2013-12-27 MED ORDER — SODIUM CHLORIDE 0.9 % IV SOLN
INTRAVENOUS | Status: AC
  Administered 2013-12-27: 11:00:00 via INTRAVENOUS

## 2013-12-27 MED ORDER — SODIUM CHLORIDE 0.9 % IV SOLN
300.0000 mg | INTRAVENOUS | Status: AC
  Administered 2013-12-27: 300 mg via INTRAVENOUS
  Filled 2013-12-27: qty 15

## 2013-12-29 ENCOUNTER — Telehealth: Payer: Self-pay | Admitting: *Deleted

## 2013-12-29 NOTE — Telephone Encounter (Signed)
Fax form to [employer] on 12/29/13.

## 2014-01-04 NOTE — Telephone Encounter (Signed)
I have talked with Neurologist Cohan working for her insurance company.  Update her medical information

## 2014-01-20 ENCOUNTER — Other Ambulatory Visit: Payer: Self-pay | Admitting: Neurology

## 2014-01-20 ENCOUNTER — Telehealth: Payer: Self-pay | Admitting: Neurology

## 2014-01-20 DIAGNOSIS — G35 Multiple sclerosis: Secondary | ICD-10-CM

## 2014-01-20 NOTE — Telephone Encounter (Signed)
Please put the order in.

## 2014-01-20 NOTE — Telephone Encounter (Signed)
Hospital sign and held orders are completed.

## 2014-01-20 NOTE — Telephone Encounter (Signed)
Dee with [facility] short stay @ [phone] need order for Tysabri.  Pleases call and advise.

## 2014-01-24 ENCOUNTER — Encounter (HOSPITAL_COMMUNITY): Payer: Self-pay

## 2014-01-24 ENCOUNTER — Encounter (HOSPITAL_COMMUNITY)
Admission: RE | Admit: 2014-01-24 | Discharge: 2014-01-24 | Disposition: A | Discharge status: Home / Self Care | Payer: Self-pay | Source: Ambulatory Visit | Attending: Neurology | Admitting: Neurology

## 2014-01-24 VITALS — BP 134/91 | HR 70 | Temp 97.8°F | Resp 16 | Ht 63.5 in | Wt 176.0 lb

## 2014-01-24 DIAGNOSIS — G35 Multiple sclerosis: Secondary | ICD-10-CM | POA: Insufficient documentation

## 2014-01-24 MED ORDER — SODIUM CHLORIDE 0.9 % IV SOLN
INTRAVENOUS | Status: DC
  Administered 2014-01-24: 10:00:00 via INTRAVENOUS

## 2014-01-24 MED ORDER — ACETAMINOPHEN 500 MG PO TABS
1000.0000 mg | ORAL_TABLET | Freq: Once | ORAL | Status: AC
  Administered 2014-01-24: 1000 mg via ORAL
  Filled 2014-01-24: qty 2

## 2014-01-24 MED ORDER — LORATADINE 10 MG PO TABS
10.0000 mg | ORAL_TABLET | Freq: Once | ORAL | Status: AC
  Administered 2014-01-24: 10 mg via ORAL
  Filled 2014-01-24: qty 1

## 2014-01-24 MED ORDER — SODIUM CHLORIDE 0.9 % IV SOLN
300.0000 mg | INTRAVENOUS | Status: DC
  Administered 2014-01-24: 300 mg via INTRAVENOUS
  Filled 2014-01-24: qty 15

## 2014-01-24 NOTE — Progress Notes (Signed)
Patient tolerated treatment well, Unwilling to stay today for 1 hour post observation

## 2014-01-31 ENCOUNTER — Telehealth: Payer: Self-pay | Admitting: Neurology

## 2014-01-31 NOTE — Telephone Encounter (Signed)
Patient needs a letter stating that Dr.Yan is releasing patient and the letter needs to have today's date so She can get her severance package.  Call patient when letter is ready.

## 2014-01-31 NOTE — Telephone Encounter (Signed)
I have talked with patient, she stated that in order for her to get severance package from her insurance company, she needs some statement from our office, I have advised patient to get the necessary paperwork, my office may help her fill the paperwork, according to her medical condition

## 2014-02-21 ENCOUNTER — Encounter (HOSPITAL_COMMUNITY)
Admission: RE | Admit: 2014-02-21 | Discharge: 2014-02-21 | Disposition: A | Discharge status: Home / Self Care | Payer: Medicaid Other | Source: Ambulatory Visit | Attending: Neurology | Admitting: Neurology

## 2014-02-21 ENCOUNTER — Encounter (HOSPITAL_COMMUNITY): Payer: Self-pay

## 2014-02-21 VITALS — BP 128/88 | HR 73 | Temp 98.1°F | Resp 18 | Ht 63.5 in | Wt 176.0 lb

## 2014-02-21 DIAGNOSIS — G35 Multiple sclerosis: Secondary | ICD-10-CM | POA: Diagnosis not present

## 2014-02-21 MED ORDER — SODIUM CHLORIDE 0.9 % IV SOLN
INTRAVENOUS | Status: DC
  Administered 2014-02-21: 09:00:00 via INTRAVENOUS

## 2014-02-21 MED ORDER — ACETAMINOPHEN 500 MG PO TABS
1000.0000 mg | ORAL_TABLET | ORAL | Status: DC
  Administered 2014-02-21: 1000 mg via ORAL
  Filled 2014-02-21: qty 2

## 2014-02-21 MED ORDER — LORATADINE 10 MG PO TABS
10.0000 mg | ORAL_TABLET | ORAL | Status: DC
  Administered 2014-02-21: 10 mg via ORAL
  Filled 2014-02-21: qty 1

## 2014-02-21 MED ORDER — SODIUM CHLORIDE 0.9 % IV SOLN
300.0000 mg | INTRAVENOUS | Status: DC
  Administered 2014-02-21: 300 mg via INTRAVENOUS
  Filled 2014-02-21: qty 15

## 2014-02-21 NOTE — Discharge Instructions (Signed)

## 2014-02-21 NOTE — Progress Notes (Signed)
Post- Infusion Tysabri, pt. Stayed 10 minutes post-infusion, pt. To follow-up with doctor with any problems.

## 2014-03-17 ENCOUNTER — Telehealth: Payer: Self-pay | Admitting: Nurse Practitioner

## 2014-03-17 DIAGNOSIS — G35 Multiple sclerosis: Secondary | ICD-10-CM

## 2014-03-17 DIAGNOSIS — Z5181 Encounter for therapeutic drug level monitoring: Secondary | ICD-10-CM | POA: Insufficient documentation

## 2014-03-17 NOTE — Telephone Encounter (Signed)
Called patient and left her a message time to have JC antibody done. Stated to patient hours of lab and to check in with the front desk.

## 2014-03-17 NOTE — Telephone Encounter (Signed)
Please let patient know it is time to get labs for JC antibody. Order in the system

## 2014-03-18 NOTE — Telephone Encounter (Signed)
Called patient back and she will come in next week and have labs done. Patient understood.

## 2014-03-23 ENCOUNTER — Encounter (HOSPITAL_COMMUNITY)
Admission: RE | Admit: 2014-03-23 | Discharge: 2014-03-23 | Disposition: A | Discharge status: Home / Self Care | Payer: Medicaid Other | Source: Ambulatory Visit | Attending: Neurology | Admitting: Neurology

## 2014-03-23 ENCOUNTER — Encounter (HOSPITAL_COMMUNITY): Payer: Self-pay

## 2014-03-23 VITALS — BP 126/81 | HR 73 | Temp 98.7°F | Resp 20 | Ht 63.5 in | Wt 176.0 lb

## 2014-03-23 DIAGNOSIS — G35 Multiple sclerosis: Secondary | ICD-10-CM | POA: Insufficient documentation

## 2014-03-23 MED ORDER — ACETAMINOPHEN 500 MG PO TABS
1000.0000 mg | ORAL_TABLET | ORAL | Status: DC
  Administered 2014-03-23: 1000 mg via ORAL
  Filled 2014-03-23: qty 2

## 2014-03-23 MED ORDER — SODIUM CHLORIDE 0.9 % IV SOLN
INTRAVENOUS | Status: DC
  Administered 2014-03-23: 09:00:00 via INTRAVENOUS

## 2014-03-23 MED ORDER — SODIUM CHLORIDE 0.9 % IV SOLN
300.0000 mg | INTRAVENOUS | Status: AC
  Administered 2014-03-23: 300 mg via INTRAVENOUS
  Filled 2014-03-23: qty 15

## 2014-03-23 MED ORDER — LORATADINE 10 MG PO TABS
10.0000 mg | ORAL_TABLET | ORAL | Status: DC
  Administered 2014-03-23: 10 mg via ORAL
  Filled 2014-03-23: qty 1

## 2014-03-23 NOTE — Progress Notes (Signed)
Post-Infusion Tysabri, pt. Stayed 15 minutes post-infusion, pt. To follow-up with doctor with any problems. 

## 2014-03-23 NOTE — Discharge Instructions (Signed)

## 2014-04-13 ENCOUNTER — Telehealth: Payer: Self-pay | Admitting: Neurology

## 2014-04-13 ENCOUNTER — Encounter: Payer: Self-pay | Admitting: Neurology

## 2014-04-13 NOTE — Telephone Encounter (Signed)
Called patient, LVM and LVM with rescheduled appointment from 12/29 @ 8:30 am to 06/07/14 @ 10:45.

## 2014-04-21 ENCOUNTER — Encounter (HOSPITAL_COMMUNITY)
Admission: RE | Admit: 2014-04-21 | Discharge: 2014-04-21 | Disposition: A | Discharge status: Home / Self Care | Payer: Medicaid Other | Source: Ambulatory Visit | Attending: Neurology | Admitting: Neurology

## 2014-04-21 VITALS — BP 131/98 | HR 68 | Temp 98.3°F | Resp 16 | Ht 63.5 in | Wt 176.0 lb

## 2014-04-21 DIAGNOSIS — G35 Multiple sclerosis: Secondary | ICD-10-CM | POA: Insufficient documentation

## 2014-04-21 MED ORDER — ACETAMINOPHEN 500 MG PO TABS
1000.0000 mg | ORAL_TABLET | ORAL | Status: DC
  Administered 2014-04-21: 1000 mg via ORAL
  Filled 2014-04-21: qty 2

## 2014-04-21 MED ORDER — LORATADINE 10 MG PO TABS
10.0000 mg | ORAL_TABLET | ORAL | Status: DC
  Administered 2014-04-21: 10 mg via ORAL
  Filled 2014-04-21: qty 1

## 2014-04-21 MED ORDER — SODIUM CHLORIDE 0.9 % IV SOLN
300.0000 mg | INTRAVENOUS | Status: AC
  Administered 2014-04-21: 300 mg via INTRAVENOUS
  Filled 2014-04-21: qty 15

## 2014-04-21 MED ORDER — SODIUM CHLORIDE 0.9 % IV SOLN
INTRAVENOUS | Status: DC
  Administered 2014-04-21: 10:00:00 via INTRAVENOUS

## 2014-04-21 NOTE — Progress Notes (Signed)
tysabri completed, no complications, pt tolerated well.  Pt stayed 15 min.post infusion, told to follow up with MD for questions/concerns.

## 2014-05-17 ENCOUNTER — Telehealth: Payer: Self-pay | Admitting: Neurology

## 2014-05-17 NOTE — Telephone Encounter (Signed)
Dee with [facility] requesting Tysabri orders.  Patient scheduled 12/17 @ 9:00 am.

## 2014-05-19 ENCOUNTER — Encounter (HOSPITAL_COMMUNITY): Admission: RE | Admit: 2014-05-19 | Payer: Medicaid Other | Source: Ambulatory Visit

## 2014-05-24 ENCOUNTER — Other Ambulatory Visit: Payer: Self-pay | Admitting: Neurology

## 2014-05-24 DIAGNOSIS — G35 Multiple sclerosis: Secondary | ICD-10-CM

## 2014-05-24 NOTE — Telephone Encounter (Signed)
Entered order for Tysarbri infusion

## 2014-05-24 NOTE — Telephone Encounter (Signed)
-----   Message from Katherene Ponto, [place] sent at 05/17/2014  3:57 PM EST ----- Regarding: Tysabri Orders Contact: [phone] Hi Dr. Terrace Arabia. Thank you again for entering the previous orders needed for Ms. Graves. We also need Tysabri orders entered for Ms. Klim as well. Your office was called on 05/16/14 for these orders, but they have not been placed as of today. If you can enter these orders for [place] as well, it would be greatly appreciated.Thank you in advance.   Conception Oms

## 2014-05-30 ENCOUNTER — Ambulatory Visit: Payer: Self-pay | Admitting: Nurse Practitioner

## 2014-05-30 ENCOUNTER — Ambulatory Visit: Payer: Self-pay | Admitting: Neurology

## 2014-05-31 ENCOUNTER — Ambulatory Visit: Payer: Self-pay | Admitting: Neurology

## 2014-06-07 ENCOUNTER — Ambulatory Visit (INDEPENDENT_AMBULATORY_CARE_PROVIDER_SITE_OTHER): Payer: Medicaid Other | Admitting: Neurology

## 2014-06-07 ENCOUNTER — Encounter: Payer: Self-pay | Admitting: Neurology

## 2014-06-07 VITALS — BP 135/96 | HR 88 | Ht 64.0 in | Wt 181.0 lb

## 2014-06-07 DIAGNOSIS — R269 Unspecified abnormalities of gait and mobility: Secondary | ICD-10-CM

## 2014-06-07 DIAGNOSIS — G35 Multiple sclerosis: Secondary | ICD-10-CM

## 2014-06-07 MED ORDER — BACLOFEN 10 MG PO TABS: ORAL_TABLET | ORAL | Status: DC

## 2014-06-07 NOTE — Progress Notes (Addendum)
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   She was last seen June 2015 by Eber Jones. She has a history of relapsing remitting multiple sclerosis and is currently on Tysabri monthly.  Most recent MRI of the brain with without contrast in July 2015 at [facility] imaging: multiple subcortical, periventricular, corpus callosum brainstem and cerebellar white matter hyperintensities consistent with multiple sclerosis. No enhancing lesions are noted. The presence of multiple T1 black holes and mild atrophy of corpus callosum and cortex indicates chronic disease. Overall no significant changes compared with previous MRI scan dated 05/24/2013.   She denies any visual symptoms, she has not fallen. She does have some bladder urgency frequency and wears depends at times. She denies any significant weakness, any sensory changes any speech or swallowing problems. She returns for reevaluation. She is currently not working as her job was eliminated at [employer] [place].  HISTORY:She has history of hypertension, in May 18, 2010,while walking towards her car at the end of her working day, her legs give out underneath her, she fell face down on the concrete pavement at her Parking lot infront of [employer] [place], knocked out her front teeth.    MRI of the brain,in May 19, 2010 without contrast, demonstrated diffuse white matter changes throughout the supratentorial and infratentorial region, with corpus callosum involvement, MRI scan of the cervical spine shows demyelinating enhancing plaques at C 3 on right and C 4-5 on left. consistent with MS   Repeat MRI scan of the brain in 2012, showed multiple brainstem, cerebellar, subcortical, corpus callosal and periventricular white matter lesions typical for demyelinating disease. Several T 1 black  holes and enhancing lesions are seen bilaterally suggesting chronic and active disease. Significant progression and worsening compared with noncontrast MRI 05/19/10.  CSF showed elevated Ig G index, 9 ocb, extensive lab test for mimic were negative, including HIV, RPR, b12 b6, ACE lyme SSA, B, CBC, CMP, PEP, ana, copper, mild ele crp 6.5, and very low vit D<4.   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 JC virus index 05/17/13 index value was 1.17 JC Antibody is positive. Positive with titer of 1.6 9 in June 07 2014  UPDATE Jan 5th 2016: She was on long term disability for 2-3 years, now she is in the process of applying for social disability, waiting for the final decision. She still has mild gait difficulty, muscle spasm, urinary urgency, occasional bowel urgency.   REVIEW OF SYSTEMS: Full 14 system  review of systems performed and notable only for those listed, all others are neg:     ALLERGIES: No Known Allergies  HOME MEDICATIONS: Outpatient Prescriptions Prior to Visit  Medication Sig Dispense Refill  . atenolol (TENORMIN) 50 MG tablet Take 50 mg by mouth daily.      . calcium  citrate (CALCITRATE - DOSED IN MG ELEMENTAL CALCIUM) 950 MG tablet Take 1 tablet by mouth daily.      . sodium chloride 0.9 % SOLN 100 mL with natalizumab 300 MG/15ML CONC 300 mg Inject 300 mg into the vein.     No facility-administered medications prior to visit.    PAST MEDICAL HISTORY: Past Medical History  Diagnosis Date  . MS (multiple sclerosis)   . Hypertension   . Abnormality of gait   . Other nonspecific abnormal result of function study of brain and central nervous system   . Personal history of fall     PAST SURGICAL HISTORY: Past Surgical History  Procedure Laterality Date  . Cholecystectomy    . Gallbladder surgery      FAMILY HISTORY: Family History  Problem Relation Age of Onset  . Cancer Maternal Grandmother   . High blood pressure      Runs in both sides of the family    SOCIAL HISTORY: History   Social History  . Marital Status: Single    Spouse Name: N/A    Number of Children: 2  . 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 Smoking status: Never Smoker   . Smokeless tobacco: Never Used  . Alcohol Use: No  . Drug Use: No  . Sexual Activity: Not on file   Other Topics Concern  . Not on file   Social History Narrative   Patient has a high school education and two children. Patient is not currently working.   Patient is right-handed.   Patient drinks three cups of tea daily and two cans of soda daily.     PHYSICAL EXAM  Filed Vitals:   06/07/14 1125  BP: 135/96  Pulse: 88  Height:  (1.626 m)  Weight: 181 lb (82.101 kg)   Body mass index is 31.05 kg/(m^2). Generalized: Well developed, in no acute distress  Head: normocephalic and atraumatic,. Oropharynx benign  Neck: Supple, no carotid bruits  Cardiac: Regular rate rhythm, no murmur  Musculoskeletal: No deformity   Neurological examination  Mentation: Alert oriented to time, place, history taking. Follows  all commands speech and language fluent  Cranial nerve II-XII: Visual acuity 20/40 bilaterally .Pupils were equal round reactive to light extraocular movements were full, visual field were full on confrontational test. Facial sensation and strength were normal. hearing was intact to finger rubbing bilaterally. Uvula tongue midline. head turning and shoulder shrug were normal and symmetric.Tongue protrusion into cheek strength was normal.  Motor: normal bulk and tone, mild weakness in the upper extremities, mild spasticity both lower extremities and mild weakness hip flexors.  Sensory: normal and symmetric to light touch, pinprick, and vibration  Coordination: finger-nose-finger, heel-to-shin bilaterally, no dysmetria  Reflexes: Brachioradialis 2/2, biceps 2/2, triceps 2/2, patellar 2/2, Achilles 2/2, plantar responses were flexor bilaterally.  Gait and Station: Rising up from seated position without assistance, mild stiff cautious gait   DIAGNOSTIC DATA (LABS, IMAGING, TESTING) - I reviewed patient records, labs, notes, testing and imaging myself where available.  Lab Results  Component Value Date  WBC 9.9 11/26/2013   HGB 11.5 11/26/2013   HCT 33.8* 11/26/2013   MCV 84 11/26/2013   PLT 340 05/18/2010      Component Value Date/Time   NA 138 11/26/2013 1020   NA 145 05/18/2010 1900   K 4.5 11/26/2013 1020   CL 102 11/26/2013 1020   CO2 28 11/26/2013 1020   GLUCOSE 87 11/26/2013 1020   GLUCOSE 84 05/18/2010 1900   BUN 11 11/26/2013 1020   BUN 13 05/18/2010 1900   CREATININE 0.85 11/26/2013 1020   CALCIUM 10.0 11/26/2013 1020   PROT 7.4 11/26/2013 1020   PROT 8.5* 05/18/2010 1900   ALBUMIN 4.7 05/18/2010 1900   AST 12 11/26/2013 1020   ALT 8 11/26/2013 1020   ALKPHOS 90 11/26/2013 1020   BILITOT 0.4 11/26/2013 1020   GFRNONAA 90 11/26/2013 1020   GFRAA 103 11/26/2013 1020    ASSESSMENT AND PLAN  34 y.o. year old female  has a past medical history of MS (multiple  sclerosis); Hypertension; Abnormality of gait; Other nonspecific abnormal result of function study of brain and central nervous system; her to followup for her multiple sclerosis. She is on Tysabri..05/17/13 index value was 1.17 JC Antibody is positive.   Repeat MRI of the brain with and without   Labs Start baclofen 10 mg 3 times a day, titrating to 2 tablets 3 times a day, she is interested in [place] research study   Levert Feinstein, M.D. Ph.D.  [facility] [address] [place], [place] [address] Phone: [phone] Fax:      [phone]

## 2014-06-08 ENCOUNTER — Encounter (HOSPITAL_COMMUNITY)
Admission: RE | Admit: 2014-06-08 | Discharge: 2014-06-08 | Disposition: A | Discharge status: Home / Self Care | Payer: Medicaid Other | Source: Ambulatory Visit | Attending: Neurology | Admitting: Neurology

## 2014-06-08 ENCOUNTER — Encounter (HOSPITAL_COMMUNITY): Payer: Self-pay

## 2014-06-08 VITALS — BP 113/86 | HR 93 | Temp 97.5°F | Resp 18 | Ht 64.0 in | Wt 181.0 lb

## 2014-06-08 DIAGNOSIS — G35 Multiple sclerosis: Secondary | ICD-10-CM | POA: Diagnosis present

## 2014-06-08 MED ORDER — LORATADINE 10 MG PO TABS
10.0000 mg | ORAL_TABLET | ORAL | Status: DC
  Administered 2014-06-08: 10 mg via ORAL
  Filled 2014-06-08: qty 1

## 2014-06-08 MED ORDER — SODIUM CHLORIDE 0.9 % IV SOLN
INTRAVENOUS | Status: DC
  Administered 2014-06-08: 250 mL via INTRAVENOUS

## 2014-06-08 MED ORDER — SODIUM CHLORIDE 0.9 % IV SOLN
300.0000 mg | INTRAVENOUS | Status: DC
  Administered 2014-06-08: 300 mg via INTRAVENOUS
  Filled 2014-06-08: qty 15

## 2014-06-08 MED ORDER — ACETAMINOPHEN 500 MG PO TABS
1000.0000 mg | ORAL_TABLET | ORAL | Status: DC
  Administered 2014-06-08: 1000 mg via ORAL
  Filled 2014-06-08: qty 2

## 2014-06-08 NOTE — Progress Notes (Signed)
Uneventful infusion of Tysabri.Pt stayed 30 minutes of the suggested observation time post Tysabri infusion. Pt states she will contact her Dr for any questions or concerns

## 2014-06-08 NOTE — Discharge Instructions (Signed)
TYSABRI °Natalizumab injection °What is this medicine? °NATALIZUMAB (na ta LIZ you mab) is used to treat relapsing multiple sclerosis. This drug is not a cure. It is also used to treat Crohn's disease. °This medicine may be used for other purposes; ask your health care provider or pharmacist if you have questions. °COMMON BRAND NAME(S): Tysabri °What should I tell my health care provider before I take this medicine? °They need to know if you have any of these conditions: °-immune system problems °-progressive multifocal leukoencephalopathy (PML) °-an unusual or allergic reaction to natalizumab, other medicines, foods, dyes, or preservatives °-pregnant or trying to get pregnant °-breast-feeding °How should I use this medicine? °This medicine is for infusion into a vein. It is given by a health care professional in a hospital or clinic setting. °A special MedGuide will be given to you by the pharmacist with each prescription and refill. Be sure to read this information carefully each time. °Talk to your pediatrician regarding the use of this medicine in children. This medicine is not approved for use in children. °Overdosage: If you think you have taken too much of this medicine contact a poison control center or emergency room at once. °NOTE: This medicine is only for you. Do not share this medicine with others. °What if I miss a dose? °It is important not to miss your dose. Call your doctor or health care professional if you are unable to keep an appointment. °What may interact with this medicine? °-azathioprine °-cyclosporine °-interferon °-6-mercaptopurine °-methotrexate °-steroid medicines like prednisone or cortisone °-TNF-alpha inhibitors like adalimumab, etanercept, and infliximab °-vaccines °This list may not describe all possible interactions. Give your health care provider a list of all the medicines, herbs, non-prescription drugs, or dietary supplements you use. Also tell them if you smoke, drink alcohol,  or use illegal drugs. Some items may interact with your medicine. °What should I watch for while using this medicine? °Your condition will be monitored carefully while you are receiving this medicine. Visit your doctor for regular check ups. Tell your doctor or healthcare professional if your symptoms do not start to get better or if they get worse. °Stay away from people who are sick. Call your doctor or health care professional for advice if you get a fever, chills or sore throat, or other symptoms of a cold or flu. Do not treat yourself. °In some patients, this medicine may cause a serious brain infection that may cause death. If you have any problems seeing, thinking, speaking, walking, or standing, tell your doctor right away. If you cannot reach your doctor, get urgent medical care. °What side effects may I notice from receiving this medicine? °Side effects that you should report to your doctor or health care professional as soon as possible: °-allergic reactions like skin rash, itching or hives, swelling of the face, lips, or tongue °-breathing problems °-changes in vision °-chest pain °-dark urine °-depression, feelings of sadness °-dizziness °-general ill feeling or flu-like symptoms °-irregular, missed, or painful menstrual periods °-light-colored stools °-loss of appetite, nausea °-muscle weakness °-problems with balance, talking, or walking °-right upper belly pain °-unusually weak or tired °-yellowing of the eyes or skin °Side effects that usually do not require medical attention (report to your doctor or health care professional if they continue or are bothersome): °-aches, pains °-headache °-stomach upset °-tiredness °This list may not describe all possible side effects. Call your doctor for medical advice about side effects. You may report side effects to FDA at 1-800-FDA-1088. °Where should I   keep my medicine? °This drug is given in a hospital or clinic and will not be stored at home. °NOTE: This  sheet is a summary. It may not cover all possible information. If you have questions about this medicine, talk to your doctor, pharmacist, or health care provider. °© 2015, [employer]/Gold Standard. (2008-07-09 13:33:21) °Multiple Sclerosis °Multiple sclerosis (MS) is a disease of the central nervous system. It leads to the loss of the insulating covering of the nerves (myelin sheath) of your brain. When this happens, brain signals do not get sent properly or may not get sent at all. The age of onset of MS varies.  °CAUSES °The cause of MS is unknown. However, it is more common in the northern [place] than in the southern [place]. °RISK FACTORS °There is a higher number of women with MS than men. MS is not an illness that is passed down to you from your family members (inherited). However, your risk of MS is higher if you have a relative with MS. °SIGNS AND SYMPTOMS  °The symptoms of MS occur in episodes or attacks. These attacks may last weeks to months. There may be long periods of almost no symptoms between attacks. The symptoms of MS vary. This is because of the many different ways it affects the central nervous system. The main symptoms of MS include: °· Vision problems and eye pain. °· Numbness. °· Weakness. °· Inability to move your arms, hands, feet, or legs (paralysis). °· Balance problems. °· Tremors. °DIAGNOSIS  °Your health care provider can diagnose MS with the help of imaging exams and lab tests. These may include specialized X-ray exams and spinal fluid tests. The best imaging exam to confirm a diagnosis of MS is an MRI. °TREATMENT  °There is no known cure for MS, but there are medicines that can decrease the number and frequency of attacks. Steroids are often used for short-term relief. Physical and occupational therapy may also help. There are also many new alternative or complementary treatments available to help control the symptoms of MS. Ask your health care provider if any of these  other options are right for you. °HOME CARE INSTRUCTIONS  °· Take medicines as directed by your health care provider. °· Exercise as directed by your health care provider. °SEEK MEDICAL CARE IF: °You begin to feel depressed. °SEEK IMMEDIATE MEDICAL CARE IF: °· You develop paralysis. °· You have problems with bladder, bowel, or sexual function. °· You develop mental changes, such as forgetfulness or mood swings. °· You have a period of uncontrolled movements (seizure). °Document Released: 05/17/2000 Document Revised: 05/25/2013 Document Reviewed: 01/25/2013 °ExitCare® Patient Information ©2015 [employer]. This information is not intended to replace advice given to you by your health care provider. Make sure you discuss any questions you have with your health care provider. ° ° °

## 2014-06-09 LAB — CBC WITH DIFFERENTIAL
BASOS ABS: 0 10*3/uL (ref 0.0–0.2)
BASOS: 0 %
Eos: 2 %
Eosinophils Absolute: 0.2 10*3/uL (ref 0.0–0.4)
HEMATOCRIT: 38.1 % (ref 34.0–46.6)
Hemoglobin: 13 g/dL (ref 11.1–15.9)
Immature Grans (Abs): 0 10*3/uL (ref 0.0–0.1)
Immature Granulocytes: 0 %
LYMPHS: 32 %
Lymphocytes Absolute: 4 10*3/uL — ABNORMAL HIGH (ref 0.7–3.1)
MCH: 28.5 pg (ref 26.6–33.0)
MCHC: 34.1 g/dL (ref 31.5–35.7)
MCV: 84 fL (ref 79–97)
MONOCYTES: 8 %
Monocytes Absolute: 0.9 10*3/uL (ref 0.1–0.9)
Neutrophils Absolute: 7.2 10*3/uL — ABNORMAL HIGH (ref 1.4–7.0)
Neutrophils Relative %: 58 %
PLATELETS: 418 10*3/uL — AB (ref 150–379)
RBC: 4.56 x10E6/uL (ref 3.77–5.28)
RDW: 14.8 % (ref 12.3–15.4)
WBC: 12.4 10*3/uL — ABNORMAL HIGH (ref 3.4–10.8)

## 2014-06-09 LAB — COMPREHENSIVE METABOLIC PANEL
ALBUMIN: 3.3 g/dL — AB (ref 3.5–5.5)
ALT: 7 IU/L (ref 0–32)
AST: 10 IU/L (ref 0–40)
Albumin/Globulin Ratio: 1.4 (ref 1.1–2.5)
Alkaline Phosphatase: 85 IU/L (ref 39–117)
BILIRUBIN TOTAL: 0.6 mg/dL (ref 0.0–1.2)
BUN / CREAT RATIO: 9 (ref 8–20)
BUN: 3 mg/dL — AB (ref 6–20)
CO2: 19 mmol/L (ref 18–29)
Calcium: 8.1 mg/dL — ABNORMAL LOW (ref 8.7–10.2)
Chloride: 94 mmol/L — ABNORMAL LOW (ref 97–108)
Creatinine, Ser: 0.33 mg/dL — ABNORMAL LOW (ref 0.57–1.00)
GFR calc Af Amer: 166 mL/min/{1.73_m2} (ref >59)
GFR calc non Af Amer: 144 mL/min/{1.73_m2} (ref >59)
Globulin, Total: 2.3 g/dL (ref 1.5–4.5)
Glucose: 118 mg/dL — ABNORMAL HIGH (ref 65–99)
Potassium: 4.8 mmol/L (ref 3.5–5.2)
Sodium: 136 mmol/L (ref 134–144)
Total Protein: 5.6 g/dL — ABNORMAL LOW (ref 6.0–8.5)

## 2014-06-09 LAB — ANTI-TYSABRI (NATALIZUMAB) AB

## 2014-06-16 ENCOUNTER — Encounter (HOSPITAL_COMMUNITY): Payer: Medicaid Other

## 2014-07-07 ENCOUNTER — Encounter (HOSPITAL_COMMUNITY): Payer: Self-pay

## 2014-07-07 ENCOUNTER — Encounter (HOSPITAL_COMMUNITY)
Admission: RE | Admit: 2014-07-07 | Discharge: 2014-07-07 | Disposition: A | Discharge status: Home / Self Care | Payer: Medicaid Other | Source: Ambulatory Visit | Attending: Neurology | Admitting: Neurology

## 2014-07-07 VITALS — BP 127/88 | HR 66 | Temp 98.1°F | Resp 16

## 2014-07-07 DIAGNOSIS — G35 Multiple sclerosis: Secondary | ICD-10-CM | POA: Insufficient documentation

## 2014-07-07 MED ORDER — LORATADINE 10 MG PO TABS
10.0000 mg | ORAL_TABLET | ORAL | Status: DC
  Administered 2014-07-07: 10 mg via ORAL
  Filled 2014-07-07: qty 1

## 2014-07-07 MED ORDER — ACETAMINOPHEN 500 MG PO TABS
1000.0000 mg | ORAL_TABLET | ORAL | Status: DC
  Administered 2014-07-07: 1000 mg via ORAL
  Filled 2014-07-07: qty 2

## 2014-07-07 MED ORDER — SODIUM CHLORIDE 0.9 % IV SOLN
300.0000 mg | INTRAVENOUS | Status: DC
  Administered 2014-07-07: 300 mg via INTRAVENOUS
  Filled 2014-07-07: qty 15

## 2014-07-07 MED ORDER — SODIUM CHLORIDE 0.9 % IV SOLN
INTRAVENOUS | Status: DC
  Administered 2014-07-07: 11:00:00 via INTRAVENOUS

## 2014-07-13 ENCOUNTER — Telehealth: Payer: Self-pay | Admitting: *Deleted

## 2014-07-13 ENCOUNTER — Encounter: Payer: Self-pay | Admitting: Neurology

## 2014-07-13 ENCOUNTER — Ambulatory Visit (INDEPENDENT_AMBULATORY_CARE_PROVIDER_SITE_OTHER): Payer: Medicaid Other | Admitting: Neurology

## 2014-07-13 VITALS — BP 126/78 | HR 68 | Ht 64.0 in | Wt 181.0 lb

## 2014-07-13 DIAGNOSIS — R269 Unspecified abnormalities of gait and mobility: Secondary | ICD-10-CM

## 2014-07-13 DIAGNOSIS — G35 Multiple sclerosis: Secondary | ICD-10-CM

## 2014-07-13 NOTE — Progress Notes (Signed)
[facility]  PATIENT: Julie Pope DOB: 01/26/1981   REASON FOR VISIT: follow up for relapsing remitting MS   HISTORY OF PRESENT ILLNESS: Ms. 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  HISTORY:She has history of hypertension, in May 18, 2010,while walking towards her car at the end of her working day, her legs give out underneath her, she fell face down on the concrete pavement at her Parking lot infront of [employer], knocked out her front teeth.    MRI of the brain,in May 19, 2010 without contrast, demonstrated diffuse white matter changes throughout the supratentorial and infratentorial region, with corpus callosum involvement, MRI scan of the cervical spine shows demyelinating enhancing plaques at C 3 on right and C 4-5 on left. consistent with MS   Repeat MRI scan of the brain in 2012, showed multiple brainstem, cerebellar, subcortical, corpus callosal and periventricular white matter lesions typical for demyelinating disease. Several T 1 black holes and enhancing lesions are seen bilaterally suggesting chronic and active disease. Significant progression and worsening compared with noncontrast MRI 05/19/10.  CSF showed elevated Ig G index, 9 ocb, extensive lab test for mimic were negative, including HIV, RPR, b12 b6, ACE lyme SSA, B, CBC, CMP, PEP, ana, copper, mild ele crp 6.5, and very low vit D<4.   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 In 05/17/13,  index value was 1.17. Positive with titer of 1.6 9 in June 07 2014  UPDATE Jan 5th 2016: She was on long term disability for 2-3 years, now she is in the process of applying for  social disability, waiting for the final decision. She still has mild gait difficulty, muscle spasm, urinary urgency, occasional bowel urgency.  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 she started baclofen 10 mg 3 times a day since January fifth 2016, mild drowsiness, does help her lower extremity spasticity.  REVIEW OF SYSTEMS: Full 14 system review of systems performed and notable only for those listed, all others are neg:   ALLERGIES: No Known Allergies  HOME MEDICATIONS: Outpatient Prescriptions Prior to Visit  Medication Sig Dispense Refill  . atenolol (TENORMIN) 50 MG tablet Take 50 mg by mouth daily.      . baclofen (LIORESAL) 10 MG tablet One po tid xone week, then 2 tabs po tid 180 each 6  . calcium citrate (CALCITRATE - DOSED IN MG ELEMENTAL CALCIUM) 950 MG tablet Take 1 tablet by mouth daily.      . sodium chloride 0.9 % SOLN 100 mL with natalizumab 300 MG/15ML CONC 300 mg Inject 300 mg into the vein.     No facility-administered medications prior to visit.    PAST MEDICAL HISTORY: Past Medical History  Diagnosis Date  . MS (multiple sclerosis)   . Hypertension   . Abnormality of gait   . Other nonspecific abnormal result of function study of brain and central nervous system   . Personal history of fall     PAST SURGICAL HISTORY: Past Surgical History  Procedure Laterality Date  . Cholecystectomy    . Gallbladder surgery      FAMILY HISTORY: Family History  Problem Relation Age of Onset  . Cancer Maternal Grandmother   . High blood pressure      Runs in both sides of the family    SOCIAL HISTORY: History   Social History  . Marital Status: Single    Spouse Name: N/A  . Number of Children: 2  . Years of Education: 12   Occupational History  . FILE CLERK [employer]   Social History Main Topics  . Smoking status: Never Smoker   . Smokeless tobacco: Never Used  . Alcohol Use: No  . Drug Use: No  . Sexual Activity: Not on file   Other Topics Concern  . Not on file   Social History Narrative   Patient has a high school education and two children. Patient is not currently working.   Patient is right-handed.   Patient drinks three cups of tea daily and two cans of soda daily.     PHYSICAL EXAM  Filed Vitals:   07/13/14 0844  BP: 126/78  Pulse: 68  Height:  (1.626 m)  Weight: 181 lb (82.101 kg)   Body mass index is 31.05 kg/(m^2). Generalized: Well developed, in no acute distress  Head: normocephalic and atraumatic,. Oropharynx benign  Neck: Supple, no carotid bruits  Cardiac: Regular rate rhythm, no murmur  Musculoskeletal: No deformity   Neurological examination  Mentation: Alert oriented to time, place, history taking. Follows all commands speech and language fluent  Cranial nerve II-XII: Visual acuity 20/40 bilaterally .Pupils were equal round reactive to light extraocular movements were full, visual field were full on confrontational test. Facial sensation and strength were normal. hearing was intact to finger rubbing bilaterally. Uvula tongue midline. head turning and shoulder shrug were normal and symmetric.Tongue protrusion into cheek strength was normal.  Motor: normal bulk and tone, mild weakness in the upper extremities, mild spasticity both lower extremities and mild weakness of bilateral hip flexors.  Sensory: normal and symmetric to light touch, pinprick, and vibration  Coordination: finger-nose-finger, heel-to-shin bilaterally, no dysmetria  Reflexes: Brachioradialis 2/2, biceps 2/2, triceps 2/2, patellar 2/2, Achilles 2/2, plantar responses were flexor bilaterally.  Gait and Station: Rising up from seated position without assistance, mild stiff cautious gait   DIAGNOSTIC DATA (LABS, IMAGING, TESTING) - I  reviewed patient records, labs, notes, testing and imaging myself where available.  Lab Results  Component Value Date   WBC 12.4* 06/07/2014   HGB 13.0 06/07/2014   HCT 38.1 06/07/2014   MCV 84 06/07/2014   PLT 418* 06/07/2014      Component Value Date/Time   NA 136 06/07/2014 1255   NA 145 05/18/2010 1900   K 4.8 06/07/2014 1255   CL 94* 06/07/2014 1255   CO2 19 06/07/2014 1255   GLUCOSE 118* 06/07/2014 1255   GLUCOSE 84 05/18/2010 1900   BUN 3* 06/07/2014 1255   BUN 13 05/18/2010 1900   CREATININE 0.33* 06/07/2014 1255   CALCIUM 8.1* 06/07/2014 1255   PROT 5.6* 06/07/2014 1255   PROT 8.5* 05/18/2010 1900   ALBUMIN 4.7 05/18/2010 1900   AST 10 06/07/2014 1255   ALT 7 06/07/2014 1255   ALKPHOS 85 06/07/2014 1255   BILITOT 0.6 06/07/2014 1255   GFRNONAA 144 06/07/2014 1255   GFRAA 166 06/07/2014 1255    ASSESSMENT AND PLAN  34 y.o. year old female  has a past  medical history of MS (multiple sclerosis); Hypertension; Abnormality of gait; Other nonspecific abnormal result of function study of brain and central nervous system; her to followup for her multiple sclerosis. She is on Tysabri..05/17/13 index value was 1.17 JC Antibody is positive.   Repeat MRI of the brain with and without   Baclofen 10mg  tid since Jan 5th 2016, she reported mild improvement of her lower extremity spasticity, side effect of drowsiness, which has improved, Continue with Julie Pope infusion  Levert Feinstein, M.D. Ph.D.  [facility] [address] [place], [place] [address] Phone: [phone] Fax:      [phone]

## 2014-07-13 NOTE — Telephone Encounter (Signed)
[employer] needs the reauth for Tysabri  done by the 17th. If they do not receive this patient will not be able get infusion. Please call Rene Kocher at [phone]

## 2014-07-13 NOTE — Telephone Encounter (Signed)
Called [employer] - they had the incorrect fax number for our office.  Their records have been updated and paperwork is being sent for completion.

## 2014-07-14 ENCOUNTER — Encounter (HOSPITAL_COMMUNITY): Payer: Medicaid Other

## 2014-07-18 ENCOUNTER — Inpatient Hospital Stay: Admission: RE | Admit: 2014-07-18 | Payer: Medicaid Other | Source: Ambulatory Visit

## 2014-07-21 ENCOUNTER — Telehealth: Payer: Self-pay | Admitting: *Deleted

## 2014-07-21 NOTE — Telephone Encounter (Signed)
I called the 800 number for [employer] but was unable to find out the reason for the call - will have to wait for them to call back to see if any information is still needed.

## 2014-07-21 NOTE — Telephone Encounter (Signed)
[employer] called about patient but call was lost.

## 2014-07-25 ENCOUNTER — Telehealth: Payer: Self-pay | Admitting: Neurology

## 2014-07-25 NOTE — Telephone Encounter (Signed)
I left a message for the patient to return my call.

## 2014-07-26 ENCOUNTER — Ambulatory Visit
Admission: RE | Admit: 2014-07-26 | Discharge: 2014-07-26 | Disposition: A | Discharge status: Home / Self Care | Payer: Medicaid Other | Source: Ambulatory Visit | Attending: Neurology | Admitting: Neurology

## 2014-07-26 ENCOUNTER — Telehealth: Payer: Self-pay | Admitting: Neurology

## 2014-07-26 ENCOUNTER — Encounter (INDEPENDENT_AMBULATORY_CARE_PROVIDER_SITE_OTHER): Payer: Medicaid Other | Admitting: Diagnostic Neuroimaging

## 2014-07-26 DIAGNOSIS — R269 Unspecified abnormalities of gait and mobility: Secondary | ICD-10-CM

## 2014-07-26 DIAGNOSIS — G35 Multiple sclerosis: Secondary | ICD-10-CM

## 2014-07-26 MED ORDER — GADOBENATE DIMEGLUMINE 529 MG/ML IV SOLN
17.0000 mL | Freq: Once | INTRAVENOUS | Status: AC | PRN
  Administered 2014-07-26: 17 mL via INTRAVENOUS

## 2014-07-26 NOTE — Telephone Encounter (Signed)
I left a message for the patient to return my call.

## 2014-07-28 ENCOUNTER — Telehealth: Payer: Self-pay | Admitting: Neurology

## 2014-07-28 NOTE — Telephone Encounter (Signed)
Julie Pope, please call patient, MRI of the brain showed chronic MS lesions, no significant change compared to previous MRI scan in July 2015,  Please give her a follow-up visit in one month   Abnormal MRI brain (with and without) demonstrating: 1. Stable, but moderate burden of round and ovoid, periventricular, subcortical, pericallosal, juxtacortical, midbrain, pontine, cerebellar and cervicomedullary junction chronic demyelinating plaques. Many of these are hypointense on T1 views. No abnormal lesions are seen on post contrast views. 2. Compared to MRI on 12/02/13, no significant change.

## 2014-07-28 NOTE — Telephone Encounter (Signed)
Left message for patient to call back  

## 2014-07-28 NOTE — Telephone Encounter (Signed)
I left a message for the patient to return my call.

## 2014-07-29 ENCOUNTER — Telehealth: Payer: Self-pay | Admitting: Neurology

## 2014-07-29 NOTE — Telephone Encounter (Signed)
I left a message for the patient to return my call.

## 2014-07-29 NOTE — Telephone Encounter (Signed)
Pt aware of results.  Follow up appt scheduled.

## 2014-08-04 ENCOUNTER — Encounter (HOSPITAL_COMMUNITY)
Admission: RE | Admit: 2014-08-04 | Discharge: 2014-08-04 | Disposition: A | Discharge status: Home / Self Care | Payer: Medicaid Other | Source: Ambulatory Visit | Attending: Neurology | Admitting: Neurology

## 2014-08-04 ENCOUNTER — Encounter (HOSPITAL_COMMUNITY): Payer: Self-pay

## 2014-08-04 VITALS — BP 151/95 | HR 74 | Temp 98.1°F | Resp 16

## 2014-08-04 DIAGNOSIS — G35 Multiple sclerosis: Secondary | ICD-10-CM | POA: Diagnosis not present

## 2014-08-04 MED ORDER — SODIUM CHLORIDE 0.9 % IV SOLN
300.0000 mg | INTRAVENOUS | Status: AC
  Administered 2014-08-04: 300 mg via INTRAVENOUS
  Filled 2014-08-04: qty 15

## 2014-08-04 MED ORDER — SODIUM CHLORIDE 0.9 % IV SOLN
INTRAVENOUS | Status: AC
  Administered 2014-08-04: 12:00:00 via INTRAVENOUS

## 2014-08-04 MED ORDER — LORATADINE 10 MG PO TABS
10.0000 mg | ORAL_TABLET | ORAL | Status: AC
  Administered 2014-08-04: 10 mg via ORAL
  Filled 2014-08-04: qty 1

## 2014-08-04 MED ORDER — ACETAMINOPHEN 500 MG PO TABS
1000.0000 mg | ORAL_TABLET | ORAL | Status: AC
  Administered 2014-08-04: 1000 mg via ORAL
  Filled 2014-08-04: qty 2

## 2014-08-04 NOTE — Progress Notes (Signed)
tysabri infusion received today without incidence.  Pt tolerated well, pt stayed .post infusion. Unable to stay further, pt states she will call MD with questions/concerns.

## 2014-08-12 ENCOUNTER — Telehealth: Payer: Self-pay | Admitting: Neurology

## 2014-08-12 NOTE — Telephone Encounter (Signed)
I have attempted without success to contact this patient by phone.I left a message for the patient to return my call.

## 2014-08-23 ENCOUNTER — Ambulatory Visit (INDEPENDENT_AMBULATORY_CARE_PROVIDER_SITE_OTHER): Payer: Medicaid Other | Admitting: Neurology

## 2014-08-23 ENCOUNTER — Encounter: Payer: Self-pay | Admitting: Neurology

## 2014-08-23 VITALS — BP 151/95 | HR 67 | Ht 64.0 in | Wt 182.0 lb

## 2014-08-23 DIAGNOSIS — G35 Multiple sclerosis: Secondary | ICD-10-CM | POA: Diagnosis not present

## 2014-08-23 DIAGNOSIS — R269 Unspecified abnormalities of gait and mobility: Secondary | ICD-10-CM | POA: Diagnosis not present

## 2014-08-23 NOTE — Progress Notes (Signed)
[facility]  PATIENT: Julie Pope DOB: 09-05-1980   REASON FOR VISIT: follow up for relapsing remitting MS   HISTORY OF PRESENT ILLNESS: Ms. 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  HISTORY:She has history of hypertension, in May 18, 2010,while walking towards her car at the end of her working day, her legs give out underneath her, she fell face down on the concrete pavement at her Parking lot infront of [employer], knocked out her front teeth.    MRI of the brain,in May 19, 2010 without contrast, demonstrated diffuse white matter changes throughout the supratentorial and infratentorial region, with corpus callosum involvement, MRI scan of the cervical spine shows demyelinating enhancing plaques at C 3 on right and C 4-5 on left. consistent with MS   Repeat MRI scan of the brain in 2012, showed multiple brainstem, cerebellar, subcortical, corpus callosal and periventricular white matter lesions typical for demyelinating disease. Several T 1 black holes and enhancing lesions are seen bilaterally suggesting chronic and active disease. Significant progression and worsening compared with noncontrast MRI 05/19/10.  CSF showed elevated Ig G index, 9 ocb, extensive lab test for mimic were negative, including HIV, RPR, b12 b6, ACE lyme SSA, B, CBC, CMP, PEP, ana, copper, mild ele crp 6.5, and very low vit D<4.   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 In 05/17/13,  index value was 1.17. Positive with titer of 1.6 9 in June 07 2014  UPDATE Jan 5th 2016: She was on long term disability for 2-3 years, now she is in the process of applying for  social disability, waiting for the final decision. She still has mild gait difficulty, muscle spasm, urinary urgency, occasional bowel urgency.  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 she started baclofen 10 mg 3 times a day since January fifth 2016, mild drowsiness, does help her lower extremity spasticity.  UPDATE March 22nd 2016: We have reviewed MRI of brain Feb 2016, stable, but moderate burden of round and ovoid, periventricular, subcortical, pericallosal, juxtacortical, midbrain, pontine, cerebellar and cervicomedullary junction chronic demyelinating plaques. Many of these are hypointense on T1 views. No abnormal lesions are seen on post contrast views. Compared to MRI on 12/02/13, no significant change.  She continues to have occasional falling episode, mild unsteady gait.  REVIEW OF SYSTEMS: Full 14 system review of systems performed and notable only for those listed, all others are neg: Gait difficulty, falling,  ALLERGIES: No Known Allergies  HOME MEDICATIONS: Outpatient Prescriptions Prior to Visit  Medication Sig Dispense Refill  . atenolol (TENORMIN) 50 MG tablet Take 50 mg by mouth daily.      . baclofen (LIORESAL) 10 MG tablet One po tid xone week, then 2 tabs po tid 180 each 6  . calcium citrate (CALCITRATE - DOSED IN MG ELEMENTAL CALCIUM) 950 MG tablet Take 1 tablet by mouth daily.      . sodium chloride 0.9 % SOLN 100 mL with natalizumab 300 MG/15ML CONC 300 mg Inject 300 mg into  the vein.     No facility-administered medications prior to visit.    PAST MEDICAL HISTORY: Past Medical History  Diagnosis Date  . MS (multiple sclerosis)   . Hypertension   . Abnormality of gait   . Other nonspecific abnormal result of function study of brain and central nervous system   . Personal  history of fall     PAST SURGICAL HISTORY: Past Surgical History  Procedure Laterality Date  . Cholecystectomy    . Gallbladder surgery      FAMILY HISTORY: Family History  Problem Relation Age of Onset  . Cancer Maternal Grandmother   . High blood pressure      Runs in both sides of the family    SOCIAL HISTORY: History   Social History  . Marital Status: Single    Spouse Name: N/A  . Number of Children: 2  . 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 Smoking status: Never Smoker   . Smokeless tobacco: Never Used  . Alcohol Use: No  . Drug Use: No  . Sexual Activity: Not on file   Other Topics Concern  . Not on file   Social History Narrative   Patient has a high school education and two children. Patient is not currently working.   Patient is right-handed.   Patient drinks three cups of tea daily and two cans of soda daily.     PHYSICAL EXAM  Filed Vitals:   08/23/14 1313  BP: 151/95  Pulse: 67  Height:  (1.626 m)  Weight: 182 lb (82.555 kg)   Body mass index is 31.22 kg/(m^2). PHYSICAL EXAMNIATION:  Gen: NAD, conversant, well nourised, obese, well groomed                     Cardiovascular: Regular rate rhythm, no peripheral edema, warm, nontender. Eyes: Conjunctivae clear without exudates or hemorrhage Neck: Supple, no carotid bruise. Pulmonary: Clear to auscultation bilaterally   NEUROLOGICAL EXAM:  MENTAL STATUS: Speech:    Speech is normal; fluent and spontaneous with normal comprehension.  Cognition:    The patient is oriented to person, place, and time;     recent and remote memory intact;     language fluent;     normal attention, concentration,     fund of knowledge.  CRANIAL NERVES: CN II: Visual fields are full to confrontation. Fundoscopic exam is normal with sharp discs and no vascular changes. Venous pulsations are present bilaterally. Pupils are 4 mm and  briskly reactive to light. Visual acuity is 20/20 bilaterally. CN III, IV, VI: extraocular movement are normal. No ptosis. CN V: Facial sensation is intact to pinprick in all 3 divisions bilaterally. Corneal responses are intact.  CN VII: Face is symmetric with normal eye closure and smile. CN VIII: Hearing is normal to rubbing fingers CN IX, X: Palate elevates symmetrically. Phonation is normal. CN XI: Head turning and shoulder shrug are intact CN XII: Tongue is midline with normal movements and no atrophy.  MOTOR: There is no pronator drift of out-stretched arms. Muscle bulk and tone are normal. Muscle strength is normal.   Shoulder abduction Shoulder external rotation Elbow flexion Elbow extension Wrist flexion Wrist extension Finger abduction Hip flexion Knee flexion Knee extension Ankle dorsi flexion Ankle plantar flexion  R L 5 5 5  REFLEXES: Reflexes are 2+ and symmetric at the biceps, triceps, 3/4 knees, and ankles. Plantar responses are flexor.  SENSORY: Light touch, pinprick, position sense, and vibration sense are intact in fingers and toes.  COORDINATION: Rapid alternating movements and fine finger movements are intact. There is no dysmetria on finger-to-nose and heel-knee-shin. There are no abnormal or extraneous movements.   GAIT/STANCE: Wide based, cautious, unsteady gait. Romberg is absent.   DIAGNOSTIC DATA (LABS, IMAGING, TESTING) - I reviewed patient records, labs, notes, testing and imaging myself where available.  Lab Results  Component Value Date   WBC 12.4* 06/07/2014   HGB 13.0 06/07/2014   HCT 38.1 06/07/2014   MCV 84 06/07/2014   PLT 418* 06/07/2014      Component Value Date/Time   NA 136 06/07/2014 1255   NA 145 05/18/2010 1900   K 4.8 06/07/2014 1255   CL 94* 06/07/2014 1255   CO2 19 06/07/2014 1255   GLUCOSE 118* 06/07/2014 1255   GLUCOSE 84 05/18/2010 1900   BUN 3* 06/07/2014 1255   BUN 13  05/18/2010 1900   CREATININE 0.33* 06/07/2014 1255   CALCIUM 8.1* 06/07/2014 1255   PROT 5.6* 06/07/2014 1255   PROT 8.5* 05/18/2010 1900   ALBUMIN 4.7 05/18/2010 1900   AST 10 06/07/2014 1255   ALT 7 06/07/2014 1255   ALKPHOS 85 06/07/2014 1255   BILITOT 0.6 06/07/2014 1255   GFRNONAA 144 06/07/2014 1255   GFRAA 166 06/07/2014 1255    ASSESSMENT AND PLAN  34 y.o. year old female  has a past medical history of MS (multiple sclerosis); Hypertension; Abnormality of gait; Other nonspecific abnormal result of function study of brain and central nervous system; her to followup for her multiple sclerosis. She is on Tysabri..05/17/13 index value was 1.17 JC Antibody is positive.   Repeat MRI of the brain with and without  Every 6 months Baclofen  tid since Jan 5th 2016, she reported mild improvement of her lower extremity spasticity, side effect of drowsiness, which has improved, Continue with [name] infusion   Levert Feinstein, M.D. Ph.D.  [facility] [address] [place], [place] [address] Phone: [phone] Fax:      [phone]

## 2014-08-25 ENCOUNTER — Ambulatory Visit (INDEPENDENT_AMBULATORY_CARE_PROVIDER_SITE_OTHER): Payer: Self-pay | Admitting: Neurology

## 2014-08-25 VITALS — BP 135/92 | HR 66 | Temp 97.9°F | Resp 12

## 2014-08-25 DIAGNOSIS — G35 Multiple sclerosis: Secondary | ICD-10-CM

## 2014-08-25 DIAGNOSIS — R269 Unspecified abnormalities of gait and mobility: Secondary | ICD-10-CM

## 2014-08-25 NOTE — Progress Notes (Signed)
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  HISTORY:She has history of hypertension, in May 18, 2010,while walking towards her car at the end of her working day, her legs give out underneath her, she fell face down on the concrete pavement at her Parking lot infront of [employer], knocked out her front teeth.    MRI of the brain,in May 19, 2010 without contrast, demonstrated diffuse white matter changes throughout the supratentorial and infratentorial region, with corpus callosum involvement, MRI scan of the cervical spine shows demyelinating enhancing plaques at C 3 on right and C 4-5 on left. consistent with MS   Repeat MRI scan of the brain in 2012, showed multiple brainstem, cerebellar, subcortical, corpus callosal and periventricular white matter lesions typical for demyelinating disease. Several T 1 black holes and enhancing lesions are seen bilaterally suggesting chronic and active disease. Significant progression and worsening compared with noncontrast MRI 05/19/10.  CSF showed elevated Ig G index, 9 ocb, extensive lab test for mimic were negative, including HIV, RPR, b12 b6, ACE lyme SSA, B, CBC, CMP, PEP, ana, copper, mild ele crp 6.5, and very low vit D<4.   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 In 05/17/13,  index value was 1.17. Positive with titer of 1.6 9 in June 07 2014  UPDATE Jan 5th 2016: She was on long term disability for 2-3 years, now she is in the process of applying for  social disability, waiting for the final decision. She still has mild gait difficulty, muscle spasm, urinary urgency, occasional bowel urgency.  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 she started baclofen 10 mg 3 times a day since January fifth 2016, mild drowsiness, does help her lower extremity spasticity.  UPDATE March 22nd 2016: We have reviewed MRI of brain Feb 2016, stable, but moderate burden of round and ovoid, periventricular, subcortical, pericallosal, juxtacortical, midbrain, pontine, cerebellar and cervicomedullary junction chronic demyelinating plaques. Many of these are hypointense on T1 views. No abnormal lesions are seen on post contrast views. Compared to MRI on 12/02/13, no significant change.  She continues to have occasional falling episode, mild unsteady gait  UPDATE March 24th 2016: Patient came in for screening visit for [employer] 9-21,  REVIEW OF SYSTEMS: Full 14 system review of systems performed and notable only for those listed, all others are neg: Gait difficulty, falling,  ALLERGIES: No Known Allergies  HOME MEDICATIONS: Outpatient Prescriptions Prior to Visit  Medication Sig Dispense Refill  . atenolol (TENORMIN) 50 MG tablet Take 50 mg by mouth daily.      . baclofen (LIORESAL) 10 MG tablet One po tid xone week, then 2 tabs po tid 180 each 6  . calcium citrate (CALCITRATE - DOSED IN MG ELEMENTAL CALCIUM) 950 MG tablet Take 1 tablet by mouth daily.      . sodium chloride 0.9 %  SOLN 100 mL with natalizumab 300 MG/15ML CONC 300 mg Inject 300 mg into the vein.     No facility-administered medications prior to visit.    PAST MEDICAL HISTORY: Past Medical History  Diagnosis Date  . MS (multiple sclerosis)   . Hypertension   . Abnormality of gait   . Other nonspecific  abnormal result of function study of brain and central nervous system   . Personal history of fall     PAST SURGICAL HISTORY: Past Surgical History  Procedure Laterality Date  . Cholecystectomy    . Gallbladder surgery      FAMILY HISTORY: Family History  Problem Relation Age of Onset  . Cancer Maternal Grandmother   . High blood pressure      Runs in both sides of the family    SOCIAL HISTORY: History   Social History  . Marital Status: Single    Spouse Name: N/A  . Number of Children: 2  . 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 Smoking status: Never Smoker   . Smokeless tobacco: Never Used  . Alcohol Use: No  . Drug Use: No  . Sexual Activity: Not on file   Other Topics Concern  . Not on file   Social History Narrative   Patient has a high school education and two children. Patient is not currently working.   Patient is right-handed.   Patient drinks three cups of tea daily and two cans of soda daily.     PHYSICAL EXAM  There were no vitals filed for this visit. There is no weight on file to calculate BMI. PHYSICAL EXAMNIATION:  Gen: NAD, conversant, well nourised, obese, well groomed                     Cardiovascular: Regular rate rhythm, no peripheral edema, warm, nontender. Eyes: Conjunctivae clear without exudates or hemorrhage Neck: Supple, no carotid bruise. Pulmonary: Clear to auscultation bilaterally   NEUROLOGICAL EXAM:  MENTAL STATUS: Speech:    Speech is normal; fluent and spontaneous with normal comprehension.  Cognition:    The patient is oriented to person, place, and time;     recent and remote memory intact;     language fluent;     normal attention, concentration,     fund of knowledge.  CRANIAL NERVES: CN II: Visual fields are full to confrontation. Fundoscopic exam is normal with sharp discs and no vascular changes. Venous pulsations are present  bilaterally. Pupils are 4 mm and briskly reactive to light. Visual acuity is 20/20 bilaterally. CN III, IV, VI: extraocular movement are normal. No ptosis. CN V: Facial sensation is intact to pinprick in all 3 divisions bilaterally. Corneal responses are intact.  CN VII: Face is symmetric with normal eye closure and smile. CN VIII: Hearing is normal to rubbing fingers CN IX, X: Palate elevates symmetrically. Phonation is normal. CN XI: Head turning and shoulder shrug are intact CN XII: Tongue is midline with normal movements and no atrophy.  MOTOR: There is no pronator drift of out-stretched arms. Mild lower extremity spasticity   Shoulder abduction Shoulder external rotation Elbow flexion Elbow extension Wrist flexion Wrist extension Finger abduction Hip flexion Knee flexion Knee extension Ankle dorsi flexion Ankle plantar flexion  R L 5  5 5    REFLEXES: Reflexes are 2+ and symmetric at the biceps, triceps, 3/4 knees, and ankles. Plantar responses are flexor.  SENSORY: Light touch, pinprick, position sense, and vibration sense are intact in fingers and toes.  COORDINATION: Rapid alternating movements and fine finger movements are intact. There is no dysmetria on finger-to-nose and heel-knee-shin. There are no abnormal or extraneous movements.   GAIT/STANCE: Wide based, cautious, unsteady gait. Romberg is absent.   DIAGNOSTIC DATA (LABS, IMAGING, TESTING) - I reviewed patient records, labs, notes, testing and imaging myself where available.  Lab Results  Component Value Date   WBC 12.4* 06/07/2014   HGB 13.0 06/07/2014   HCT 38.1 06/07/2014   MCV 84 06/07/2014   PLT 418* 06/07/2014      Component Value Date/Time   NA 136 06/07/2014 1255   NA 145 05/18/2010 1900   K 4.8 06/07/2014 1255   CL 94* 06/07/2014 1255   CO2 19 06/07/2014 1255   GLUCOSE 118* 06/07/2014 1255   GLUCOSE 84 05/18/2010 1900   BUN 3* 06/07/2014 1255   BUN  13 05/18/2010 1900   CREATININE 0.33* 06/07/2014 1255   CALCIUM 8.1* 06/07/2014 1255   PROT 5.6* 06/07/2014 1255   PROT 8.5* 05/18/2010 1900   ALBUMIN 4.7 05/18/2010 1900   AST 10 06/07/2014 1255   ALT 7 06/07/2014 1255   ALKPHOS 85 06/07/2014 1255   BILITOT 0.6 06/07/2014 1255   GFRNONAA 144 06/07/2014 1255   GFRAA 166 06/07/2014 1255    ASSESSMENT AND PLAN  34 y.o. year old female  has a past medical history of MS (multiple sclerosis); Hypertension; Abnormality of gait; Other nonspecific abnormal result of function study of brain and central nervous system; her to followup for her multiple sclerosis. She is on Tysabri..05/17/13 index value was 1.17 JC Antibody is positive.   Repeat MRI of the brain with and without  Every 6 months Baclofen 10mg  tid since Jan 5th 2016, she reported mild improvement of her lower extremity spasticity, side effect of drowsiness, which has improved, Continue with Tysarbri infusion [employer] candidate, detail see separate paper documentation, we also get her record from previous IUD placement, she will follow up with her primary care physician, in the meantime, have emphasized the importance of condom contraceptive while enrolled in trial, she voiced understanding.  Levert Feinstein, M.D. Ph.D.  [facility] [address] [place], [place] [address] Phone: [phone] Fax:      [phone]

## 2014-08-26 NOTE — Progress Notes (Signed)
I have reviewed and agreed above plan. 

## 2014-08-26 NOTE — Addendum Note (Signed)
Addended by: Shaune Leeks D on: 08/26/2014 03:27 PM   Modules accepted: Level of Service

## 2014-08-26 NOTE — Progress Notes (Signed)
Julie Pope (DOB: 13-Aug-1980) was seen on Thursday, 24MAR2016 for the Screening Visit for the [facility] CLR_09_21 Research Trial (A Placebo-Controlled Randomized Withdrawal Evaluation of the Efficacy and Safety of Baclofen ER Capsules (GRS) In Subjects with Spasticity due to Multiple Sclerosis).  Patient was provided ample time to read, review, and understand the Informed Consent Form (ICF). The patient was also given time to ask questions and have those addressed. Then, the ICF (Version Date: [id number]) was signed at 8:45h on 24MAR2016.  Patients demographics info was collected. Albeit patient is childbearing potential, patient reported two anti-contraceptive methods: Condoms (start date: 01JAN2011) and Intrauterine Device (IUD) (start date: 05MAY2010). The IUD records were immediately requested from her PCPs office (Dr. Earleen Newport), which confirmed the IUD start date. An urine pregnancy test was performed at 12:13h and was found to be negative.  Principal Investigator, Dr. Levert Feinstein, reviewed with the patient the following patients history:  MS History  Spasticity History  MS symptoms other than spasticity   Baclofen History  Medical History  Moreover, Dr. Terrace Arabia performed a complete physical and neurological examinations at 10:25h. Vital signs were non-clinically-significant. Clinical Laboratory tests and Pharmacokinetic Blood sampling were collected at 11:55h. C-SSRS was performed by Research Coordinator [facility] and exhibited no significant findings. An EKG was performed during the visit in triplicate at two minute intervals: 08:31h, 08:33h, and 08:35h.  Dr. Terrace Arabia reviewed inclusion and exclusion criteria and concluded that the subject qualifies to continue in the study. Finally, patients Visit 1 (Week 1) was scheduled for Tuesday, 05APR2016.

## 2014-09-01 ENCOUNTER — Other Ambulatory Visit (HOSPITAL_COMMUNITY): Payer: Self-pay | Admitting: Neurology

## 2014-09-01 ENCOUNTER — Encounter (HOSPITAL_COMMUNITY): Payer: Self-pay

## 2014-09-01 ENCOUNTER — Telehealth: Payer: Self-pay | Admitting: Neurology

## 2014-09-01 ENCOUNTER — Encounter (HOSPITAL_COMMUNITY)
Admission: RE | Admit: 2014-09-01 | Discharge: 2014-09-01 | Disposition: A | Discharge status: Home / Self Care | Payer: Medicaid Other | Source: Ambulatory Visit | Attending: Neurology | Admitting: Neurology

## 2014-09-01 DIAGNOSIS — G35 Multiple sclerosis: Secondary | ICD-10-CM | POA: Diagnosis not present

## 2014-09-01 MED ORDER — SODIUM CHLORIDE 0.9 % IV SOLN
INTRAVENOUS | Status: DC
  Administered 2014-09-01: 12:00:00 via INTRAVENOUS

## 2014-09-01 MED ORDER — SODIUM CHLORIDE 0.9 % IV SOLN
300.0000 mg | INTRAVENOUS | Status: DC
  Filled 2014-09-01: qty 15

## 2014-09-01 MED ORDER — SODIUM CHLORIDE 0.9 % IV SOLN
300.0000 mg | INTRAVENOUS | Status: DC
  Administered 2014-09-01: 300 mg via INTRAVENOUS
  Filled 2014-09-01 (×2): qty 15

## 2014-09-01 MED ORDER — ACETAMINOPHEN 500 MG PO TABS: 1000.0000 mg | ORAL_TABLET | ORAL | Status: DC

## 2014-09-01 MED ORDER — ACETAMINOPHEN 500 MG PO TABS
1000.0000 mg | ORAL_TABLET | ORAL | Status: DC
  Administered 2014-09-01: 1000 mg via ORAL
  Filled 2014-09-01: qty 2

## 2014-09-01 MED ORDER — LORATADINE 10 MG PO TABS
10.0000 mg | ORAL_TABLET | Freq: Every day | ORAL | Status: DC
  Filled 2014-09-01 (×2): qty 1

## 2014-09-01 MED ORDER — LORATADINE 10 MG PO TABS: 10.0000 mg | ORAL_TABLET | ORAL | Status: DC

## 2014-09-01 NOTE — Progress Notes (Signed)
Patient stayed 25 minutes post infusion. Aware of recommended policy of staying an hour post infusion. Will call MD with any problems or questions.

## 2014-09-01 NOTE — Telephone Encounter (Signed)
Order faxed to [facility].

## 2014-09-01 NOTE — Telephone Encounter (Signed)
Dee from [employer] Stay needs orders for Tysabri for pt.  Patient is due to be there at 10:30. Please advise.

## 2014-09-05 ENCOUNTER — Telehealth: Payer: Self-pay | Admitting: Neurology

## 2014-09-05 NOTE — Telephone Encounter (Signed)
I left the patient a message to confirm the appointment for tomorrow 05APR2016 at 9:00h.

## 2014-09-06 ENCOUNTER — Other Ambulatory Visit: Payer: Self-pay | Admitting: Neurology

## 2014-09-06 ENCOUNTER — Ambulatory Visit (INDEPENDENT_AMBULATORY_CARE_PROVIDER_SITE_OTHER): Payer: Self-pay | Admitting: Neurology

## 2014-09-06 VITALS — BP 132/76 | HR 64 | Temp 98.3°F | Resp 12

## 2014-09-06 DIAGNOSIS — R269 Unspecified abnormalities of gait and mobility: Secondary | ICD-10-CM

## 2014-09-06 DIAGNOSIS — G35 Multiple sclerosis: Secondary | ICD-10-CM

## 2014-09-06 MED ORDER — BACLOFEN 10 MG PO TABS: ORAL_TABLET | ORAL | Status: DC

## 2014-09-06 NOTE — Progress Notes (Signed)
Julie Pope (DOB: Jul 31, 1980) was seen today, 05APR2016 for an Unscheduled Visit for the [facility] CLR_09_21 Research Trial (A Placebo-Controlled Randomized Withdrawal Evaluation of the Efficacy and Safety of Baclofen ER Capsules (GRS) In Subjects with Spasticity due to Multiple Sclerosis).  Since her last research visit on 24MAR2016 (Screening Visit), patient stated that there were no adverse events or change in medications. Vital signs were non-clinically-significant. Clinical Laboratory tests and Pharmacokinetic Blood sampling were collected at 10:40h. An urine pregnancy test was performed at 10:25h with a negative result. C-SSRS was performed by [employer] and exhibited no significant findings. An EKG was performed during the visit in triplicate at two minute intervals: 09:08h, 09:10h, and 09:12h.  Before dispensing the Baclofen IR [facility] medication, the Education officer, community and Study Coordinator reviewed inclusion and exclusion criteria and concluded that the subject does not qualify to continue in the study, as patient is currently taking Baclofen 10 mg b.i.d. Instead of the prescribed dosage: Baclofen 10 mg t.i.d. Patient will be considered in 30 days for a screening visit if the patient is still interested.

## 2014-09-07 NOTE — Progress Notes (Signed)
I have reviewed and agreed above plan. 

## 2014-09-12 ENCOUNTER — Telehealth: Payer: Self-pay | Admitting: Neurology

## 2014-09-12 NOTE — Telephone Encounter (Signed)
I spoke to the patient about her Baclofen IR intake, and the patient stated that she started taking Baclofen IR 10 t.i.d. On 06APR2016 in the following manner: one tablet at 07:00h, one at 17:00h, and another one at 21:00h. Subject will be eligible for [facility] CLR_09_21 research study on 06MAY2016. I also asked patient about new IUD, and patient stated that she has a scheduled appointment on [id number] to replace the current one for a new one. I asked her to please bring records of procedure to expedite Screening Visit. Finally, Screening Visit was scheduled for 19MAY2016 at 10:30h.

## 2014-09-27 ENCOUNTER — Telehealth: Payer: Self-pay | Admitting: Neurology

## 2014-09-27 NOTE — Telephone Encounter (Signed)
Checked w/ medical records - have not received the paperwork yet.  Julie Pope is going to check with her caseworker and ask her to fax it over for completion.

## 2014-09-27 NOTE — Telephone Encounter (Signed)
Patient called stating that her attorney has not received patient's MS assessment paperwork for her disability.  Patient would like to follow up on this. Please call and advice. # [phone]

## 2014-09-29 ENCOUNTER — Encounter (HOSPITAL_COMMUNITY): Payer: Self-pay

## 2014-09-29 ENCOUNTER — Encounter (HOSPITAL_COMMUNITY)
Admission: RE | Admit: 2014-09-29 | Discharge: 2014-09-29 | Disposition: A | Discharge status: Home / Self Care | Payer: Medicaid Other | Source: Ambulatory Visit | Attending: Neurology | Admitting: Neurology

## 2014-09-29 VITALS — BP 169/97 | HR 71 | Temp 97.9°F | Resp 18

## 2014-09-29 DIAGNOSIS — G35 Multiple sclerosis: Secondary | ICD-10-CM

## 2014-09-29 MED ORDER — ACETAMINOPHEN 500 MG PO TABS
1000.0000 mg | ORAL_TABLET | ORAL | Status: DC
  Administered 2014-09-29: 1000 mg via ORAL
  Filled 2014-09-29: qty 2

## 2014-09-29 MED ORDER — LORATADINE 10 MG PO TABS
10.0000 mg | ORAL_TABLET | ORAL | Status: DC
  Administered 2014-09-29: 10 mg via ORAL
  Filled 2014-09-29: qty 1

## 2014-09-29 MED ORDER — SODIUM CHLORIDE 0.9 % IV SOLN
300.0000 mg | INTRAVENOUS | Status: DC
  Administered 2014-09-29: 300 mg via INTRAVENOUS
  Filled 2014-09-29: qty 15

## 2014-09-29 MED ORDER — SODIUM CHLORIDE 0.9 % IV SOLN
INTRAVENOUS | Status: DC
  Administered 2014-09-29: 12:00:00 via INTRAVENOUS

## 2014-09-29 NOTE — Progress Notes (Signed)
tysabri infusion completed, pt stayed .post infusion.  Pt knows to call MD for questions/concerns.

## 2014-10-04 ENCOUNTER — Telehealth: Payer: Self-pay | Admitting: *Deleted

## 2014-10-04 NOTE — Telephone Encounter (Signed)
Patient form on Michelle desk. 

## 2014-10-19 ENCOUNTER — Telehealth: Payer: Self-pay | Admitting: Neurology

## 2014-10-19 NOTE — Telephone Encounter (Signed)
I spoke to the patient to confirm her Screening Visit for the [facility] CLR_09_21 research study.

## 2014-10-20 ENCOUNTER — Ambulatory Visit (INDEPENDENT_AMBULATORY_CARE_PROVIDER_SITE_OTHER): Payer: Self-pay | Admitting: Neurology

## 2014-10-20 DIAGNOSIS — G35 Multiple sclerosis: Secondary | ICD-10-CM

## 2014-10-20 NOTE — Progress Notes (Signed)
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  HISTORY:She has history of hypertension, in May 18, 2010,while walking towards her car at the end of her working day, her legs give out underneath her, she fell face down on the concrete pavement at her Parking lot infront of [employer], knocked out her front teeth.    MRI of the brain,in May 19, 2010 without contrast, demonstrated diffuse white matter changes throughout the supratentorial and infratentorial region, with corpus callosum involvement, MRI scan of the cervical spine shows demyelinating enhancing plaques at C 3 on right and C 4-5 on left. consistent with MS   Repeat MRI scan of the brain in 2012, showed multiple brainstem, cerebellar, subcortical, corpus callosal and periventricular white matter lesions typical for demyelinating disease. Several T 1 black holes and enhancing lesions are seen bilaterally suggesting chronic and active disease. Significant progression and worsening compared with noncontrast MRI 05/19/10.  CSF showed elevated Ig G index, 9 ocb, extensive lab test for mimic were negative, including HIV, RPR, b12 b6, ACE lyme SSA, B, CBC, CMP, PEP, ana, copper, mild ele crp 6.5, and very low vit D<4.   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 In 05/17/13,  index value was 1.17. Positive with titer of 1.6 9 in June 07 2014  UPDATE Jan 5th 2016: She was on long term disability for 2-3 years, now she is in the process of applying for  social disability, waiting for the final decision. She still has mild gait difficulty, muscle spasm, urinary urgency, occasional bowel urgency.  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 she started baclofen 10 mg 3 times a day since January fifth 2016, mild drowsiness, does help her lower extremity spasticity.  UPDATE March 22nd 2016: We have reviewed MRI of brain Feb 2016, stable, but moderate burden of round and ovoid, periventricular, subcortical, pericallosal, juxtacortical, midbrain, pontine, cerebellar and cervicomedullary junction chronic demyelinating plaques. Many of these are hypointense on T1 views. No abnormal lesions are seen on post contrast views. Compared to MRI on 12/02/13, no significant change.  She continues to have occasional falling episode, mild unsteady gait.  UPDATE May 19th 2016: She came in for [facility] 9-21 screening visit, She has Mirena IUD insertion in May 6th 2016 at [facility], also using condom contraceptives since 2011, I have emphasized the importance of contraception, she voiced understanding  REVIEW OF SYSTEMS: Full 14 system review of systems performed and notable only for those listed, all others are neg: Gait difficulty, falling,  ALLERGIES: No Known Allergies  HOME MEDICATIONS: Outpatient Prescriptions Prior to Visit  Medication Sig Dispense Refill  . atenolol (TENORMIN) 50 MG tablet Take 50 mg by mouth daily.      . baclofen (LIORESAL) 10 MG tablet One tab po tid 180 each 6  . calcium citrate (CALCITRATE - DOSED IN  MG ELEMENTAL CALCIUM) 950 MG tablet Take 1 tablet by mouth daily.       No facility-administered medications prior to visit.    PAST MEDICAL HISTORY: Past Medical History  Diagnosis Date  . MS (multiple sclerosis)   . Hypertension   .  Abnormality of gait   . Other nonspecific abnormal result of function study of brain and central nervous system   . Personal history of fall     PAST SURGICAL HISTORY: Past Surgical History  Procedure Laterality Date  . Cholecystectomy    . Gallbladder surgery      FAMILY HISTORY: Family History  Problem Relation Age of Onset  . Cancer Maternal Grandmother   . High blood pressure      Runs in both sides of the family    SOCIAL HISTORY: History   Social History  . Marital Status: Single    Spouse Name: N/A  . Number of Children: 2  . 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 Smoking status: Never Smoker   . Smokeless tobacco: Never Used  . Alcohol Use: No  . Drug Use: No  . Sexual Activity: Not on file   Other Topics Concern  . Not on file   Social History Narrative   Patient has a high school education and two children. Patient is not currently working.   Patient is right-handed.   Patient drinks three cups of tea daily and two cans of soda daily.     PHYSICAL EXAM  There were no vitals filed for this visit. There is no weight on file to calculate BMI. PHYSICAL EXAMNIATION:  Gen: NAD, conversant, well nourised, obese, well groomed                     Cardiovascular: Regular rate rhythm, no peripheral edema, warm, nontender. Eyes: Conjunctivae clear without exudates or hemorrhage Neck: Supple, no carotid bruise. Pulmonary: Clear to auscultation bilaterally   NEUROLOGICAL EXAM:  MENTAL STATUS: Speech:    Speech is normal; fluent and spontaneous with normal comprehension.  Cognition:    The patient is oriented to person, place, and time;     recent and remote memory intact;     language fluent;     normal attention, concentration,     fund of knowledge.  CRANIAL NERVES: CN II: Visual fields are full to confrontation. Fundoscopic exam is normal with sharp discs and no vascular  changes. Venous pulsations are present bilaterally. Pupils are 4 mm and briskly reactive to light. Visual acuity is 20/20 bilaterally. CN III, IV, VI: extraocular movement are normal. No ptosis. CN V: Facial sensation is intact to pinprick in all 3 divisions bilaterally. Corneal responses are intact.  CN VII: Face is symmetric with normal eye closure and smile. CN VIII: Hearing is normal to rubbing fingers CN IX, X: Palate elevates symmetrically. Phonation is normal. CN XI: Head turning and shoulder shrug are intact CN XII: Tongue is midline with normal movements and no atrophy.  MOTOR: She has moderate bilateral lower extremity spasticity, left worse than right  REFLEXES: Reflexes are 2+ and symmetric at the biceps, triceps, 3/4 knees, and ankles. Plantar responses are flexor.  SENSORY: Light touch, pinprick, position sense, and vibration sense are intact in fingers and toes.  COORDINATION: Rapid alternating movements and fine finger movements are intact. There is no dysmetria on finger-to-nose and heel-knee-shin. There are no abnormal  or extraneous movements.   GAIT/STANCE: Wide based, cautious, unsteady gait. Romberg is absent.   DIAGNOSTIC DATA (LABS, IMAGING, TESTING) - I reviewed patient records, labs, notes, testing and imaging myself where available.  Lab Results  Component Value Date   WBC 12.4* 06/07/2014   HGB 13.0 06/07/2014   HCT 38.1 06/07/2014   MCV 84 06/07/2014   PLT 418* 06/07/2014      Component Value Date/Time   NA 136 06/07/2014 1255   NA 145 05/18/2010 1900   K 4.8 06/07/2014 1255   CL 94* 06/07/2014 1255   CO2 19 06/07/2014 1255   GLUCOSE 118* 06/07/2014 1255   GLUCOSE 84 05/18/2010 1900   BUN 3* 06/07/2014 1255   BUN 13 05/18/2010 1900   CREATININE 0.33* 06/07/2014 1255   CALCIUM 8.1* 06/07/2014 1255   PROT 5.6* 06/07/2014 1255   PROT 8.5* 05/18/2010 1900   ALBUMIN 4.7 05/18/2010 1900   AST 10 06/07/2014 1255   ALT 7 06/07/2014 1255    ALKPHOS 85 06/07/2014 1255   BILITOT 0.6 06/07/2014 1255   GFRNONAA 144 06/07/2014 1255   GFRAA 166 06/07/2014 1255    ASSESSMENT AND PLAN  34 y.o. year old female  has a past medical history of MS (multiple sclerosis); Hypertension; Abnormality of gait; Other nonspecific abnormal result of function study of brain and central nervous system; her to followup for her multiple sclerosis. She is on Tysabri..05/17/13 index value was 1.17 JC Antibody is positive.   Repeat MRI of the brain with and without  Every 6 months Baclofen  tid since April 6th 2016, she reported mild improvement of her lower extremity spasticity, side effect of drowsiness, which has improved, Continue with Tysarbri infusion Came in for [facility] 9-21 screening visit   Levert Feinstein, M.D. Ph.D.  [facility] [address] [place], [place] [address] Phone: [phone] Fax:      [phone]

## 2014-10-25 ENCOUNTER — Ambulatory Visit (INDEPENDENT_AMBULATORY_CARE_PROVIDER_SITE_OTHER): Payer: Self-pay | Admitting: Neurology

## 2014-10-25 DIAGNOSIS — G35 Multiple sclerosis: Secondary | ICD-10-CM

## 2014-10-25 DIAGNOSIS — R269 Unspecified abnormalities of gait and mobility: Secondary | ICD-10-CM

## 2014-10-25 NOTE — Progress Notes (Signed)
[facility]  PATIENT: Julie Pope DOB: 1980/10/27   REASON FOR VISIT: follow up for relapsing remitting MS   HISTORY OF PRESENT ILLNESS: 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  HISTORY:She has history of hypertension, in May 18, 2010,while walking towards her car at the end of her working day, her legs give out underneath her, she fell face down on the concrete pavement at her Parking lot infront of [employer], knocked out her front teeth.    MRI of the brain,in May 19, 2010 without contrast, demonstrated diffuse white matter changes throughout the supratentorial and infratentorial region, with corpus callosum involvement, MRI scan of the cervical spine shows demyelinating enhancing plaques at C 3 on right and C 4-5 on left. consistent with MS   Repeat MRI scan of the brain in 2012, showed multiple brainstem, cerebellar, subcortical, corpus callosal and periventricular white matter lesions typical for demyelinating disease. Several T 1 black holes and enhancing lesions are seen bilaterally suggesting chronic and active disease. Significant progression and worsening compared with noncontrast MRI 05/19/10.  CSF showed elevated Ig G index, 9 ocb, extensive lab test for mimic were negative, including HIV, RPR, b12 b6, ACE lyme SSA, B, CBC, CMP, PEP, ana, copper, mild ele crp 6.5, and very low vit D<4.   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 In 05/17/13,  index value was 1.17. Positive with titer of 1.6 9 in June 07 2014  UPDATE Jan 5th 2016: She was on long term disability for 2-3 years, now she is in the process of applying for  social disability, waiting for the final decision. She still has mild gait difficulty, muscle spasm, urinary urgency, occasional bowel urgency.  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 she started baclofen 10 mg 3 times a day since January fifth 2016, mild drowsiness, does help her lower extremity spasticity.  UPDATE March 22nd 2016: We have reviewed MRI of brain Feb 2016, stable, but moderate burden of round and ovoid, periventricular, subcortical, pericallosal, juxtacortical, midbrain, pontine, cerebellar and cervicomedullary junction chronic demyelinating plaques. Many of these are hypointense on T1 views. No abnormal lesions are seen on post contrast views. Compared to MRI on 12/02/13, no significant change.  She continues to have occasional falling episode, mild unsteady gait.  UPDATE May 19th 2016: She came in for [facility] 9-21 screening visit, She has Mirena IUD insertion in May 6th 2016 at [facility], also using condom contraceptives since 2011, I have emphasized the importance of contraception, she voiced understanding  UPDATE May24 2016: She is here for [facility] 9-21 visit 1, EDSS is 3.0 today.  REVIEW OF SYSTEMS: Full 14 system review of systems performed and notable only for those listed, all others are neg: Gait difficulty, falling,  ALLERGIES: No Known Allergies  HOME MEDICATIONS: Outpatient Prescriptions Prior to Visit  Medication Sig Dispense Refill  . atenolol (TENORMIN) 50 MG tablet Take 50 mg by mouth daily.      . baclofen (LIORESAL) 10 MG  tablet One tab po tid 180 each 6  . calcium citrate (CALCITRATE - DOSED IN MG ELEMENTAL CALCIUM) 950 MG tablet Take 1 tablet by mouth daily.       No facility-administered medications prior to visit.    PAST MEDICAL HISTORY: Past  Medical History  Diagnosis Date  . MS (multiple sclerosis)   . Hypertension   . Abnormality of gait   . Other nonspecific abnormal result of function study of brain and central nervous system   . Personal history of fall     PAST SURGICAL HISTORY: Past Surgical History  Procedure Laterality Date  . Cholecystectomy    . Gallbladder surgery      FAMILY HISTORY: Family History  Problem Relation Age of Onset  . Cancer Maternal Grandmother   . High blood pressure      Runs in both sides of the family    SOCIAL HISTORY: History   Social History  . Marital Status: Single    Spouse Name: N/A  . Number of Children: 2  . 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 Smoking status: Never Smoker   . Smokeless tobacco: Never Used  . Alcohol Use: No  . Drug Use: No  . Sexual Activity: Not on file   Other Topics Concern  . Not on file   Social History Narrative   Patient has a high school education and two children. Patient is not currently working.   Patient is right-handed.   Patient drinks three cups of tea daily and two cans of soda daily.     PHYSICAL EXAM  There were no vitals filed for this visit. There is no weight on file to calculate BMI. PHYSICAL EXAMNIATION:  Gen: NAD, conversant, well nourised, obese, well groomed                     Cardiovascular: Regular rate rhythm, no peripheral edema, warm, nontender. Eyes: Conjunctivae clear without exudates or hemorrhage Neck: Supple, no carotid bruise. Pulmonary: Clear to auscultation bilaterally   NEUROLOGICAL EXAM:  MENTAL STATUS: Speech:    Speech is normal; fluent and spontaneous with normal comprehension.  Cognition:    The patient is oriented to person, place, and time;     recent and remote memory intact;     language fluent;     normal attention, concentration,     fund of knowledge.  CRANIAL NERVES: CN II: Visual fields are full  to confrontation. Fundoscopic exam is normal with sharp discs and no vascular changes. Venous pulsations are present bilaterally. Pupils are 4 mm and briskly reactive to light. Visual acuity is 20/20 bilaterally. CN III, IV, VI: extraocular movement are normal. No ptosis. CN V: Facial sensation is intact to pinprick in all 3 divisions bilaterally. Corneal responses are intact.  CN VII: Face is symmetric with normal eye closure and smile. CN VIII: Hearing is normal to rubbing fingers CN IX, X: Palate elevates symmetrically. Phonation is normal. CN XI: Head turning and shoulder shrug are intact CN XII: Tongue is midline with normal movements and no atrophy.  MOTOR: She has moderate bilateral lower extremity spasticity, mild right arm proximal, right leg proximal muscle weakness.  REFLEXES: Reflexes are 2+ and symmetric at the biceps, triceps, 3/4 knees, and ankles. Plantar responses are flexor.  SENSORY: Light touch, pinprick, position sense, and vibration sense are intact in fingers and toes.  COORDINATION:  Rapid alternating movements and fine finger movements are intact. There is no dysmetria on finger-to-nose and heel-knee-shin. There are no abnormal or extraneous movements.   GAIT/STANCE: Wide based, cautious, unsteady gait. Romberg is absent.   DIAGNOSTIC DATA (LABS, IMAGING, TESTING) - I reviewed patient records, labs, notes, testing and imaging myself where available.  Lab Results  Component Value Date   WBC 12.4* 06/07/2014   HGB 13.0 06/07/2014   HCT 38.1 06/07/2014   MCV 84 06/07/2014   PLT 418* 06/07/2014      Component Value Date/Time   NA 136 06/07/2014 1255   NA 145 05/18/2010 1900   K 4.8 06/07/2014 1255   CL 94* 06/07/2014 1255   CO2 19 06/07/2014 1255   GLUCOSE 118* 06/07/2014 1255   GLUCOSE 84 05/18/2010 1900   BUN 3* 06/07/2014 1255   BUN 13 05/18/2010 1900   CREATININE 0.33* 06/07/2014 1255   CALCIUM 8.1* 06/07/2014 1255   PROT 5.6* 06/07/2014 1255     PROT 8.5* 05/18/2010 1900   ALBUMIN 4.7 05/18/2010 1900   AST 10 06/07/2014 1255   ALT 7 06/07/2014 1255   ALKPHOS 85 06/07/2014 1255   BILITOT 0.6 06/07/2014 1255   GFRNONAA 144 06/07/2014 1255   GFRAA 166 06/07/2014 1255    ASSESSMENT AND PLAN  34 y.o. year old female  has a past medical history of MS (multiple sclerosis); Hypertension; Abnormality of gait; Other nonspecific abnormal result of function study of brain and central nervous system; her to followup for her multiple sclerosis. She is on Tysabri..05/17/13 index value was 1.17 JC Antibody is positive.   Repeat MRI of the brain with and without  Every 6 months Baclofen  tid since April 6th 2016, she reported mild improvement of her lower extremity spasticity, side effect of drowsiness, which has improved, Continue with Tysarbri infusion Came in for [facility] 9-21   Visit 1, complete EDSS 3.0.  Levert Feinstein, M.D. Ph.D.  [facility] [address] [place], [place] [address] Phone: [phone] Fax:      [phone]

## 2014-10-28 ENCOUNTER — Encounter (HOSPITAL_COMMUNITY)
Admission: RE | Admit: 2014-10-28 | Discharge: 2014-10-28 | Disposition: A | Discharge status: Home / Self Care | Payer: Medicaid Other | Source: Ambulatory Visit | Attending: Neurology | Admitting: Neurology

## 2014-10-28 ENCOUNTER — Encounter (HOSPITAL_COMMUNITY): Payer: Self-pay

## 2014-10-28 VITALS — BP 118/83 | HR 79 | Temp 97.6°F | Resp 18 | Ht 64.0 in | Wt 180.0 lb

## 2014-10-28 DIAGNOSIS — G35 Multiple sclerosis: Secondary | ICD-10-CM

## 2014-10-28 MED ORDER — SODIUM CHLORIDE 0.9 % IV SOLN
INTRAVENOUS | Status: DC
  Administered 2014-10-28: 250 mL via INTRAVENOUS

## 2014-10-28 MED ORDER — ACETAMINOPHEN 500 MG PO TABS
1000.0000 mg | ORAL_TABLET | ORAL | Status: DC
  Administered 2014-10-28: 1000 mg via ORAL
  Filled 2014-10-28: qty 2

## 2014-10-28 MED ORDER — SODIUM CHLORIDE 0.9 % IV SOLN
300.0000 mg | INTRAVENOUS | Status: DC
  Administered 2014-10-28: 300 mg via INTRAVENOUS
  Filled 2014-10-28: qty 15

## 2014-10-28 MED ORDER — LORATADINE 10 MG PO TABS
10.0000 mg | ORAL_TABLET | ORAL | Status: DC
  Administered 2014-10-28: 10 mg via ORAL
  Filled 2014-10-28: qty 1

## 2014-10-28 NOTE — Progress Notes (Signed)
Uneventful infusion of TYSABRI. Pt stayed for only 15 minutes of the suggested post infusion observation. She states she will contact her neurologist for any questions or concerns

## 2014-11-01 ENCOUNTER — Encounter (INDEPENDENT_AMBULATORY_CARE_PROVIDER_SITE_OTHER): Payer: Self-pay

## 2014-11-01 DIAGNOSIS — Z0289 Encounter for other administrative examinations: Secondary | ICD-10-CM

## 2014-11-16 ENCOUNTER — Encounter (INDEPENDENT_AMBULATORY_CARE_PROVIDER_SITE_OTHER): Payer: Medicaid Other

## 2014-11-16 DIAGNOSIS — Z0289 Encounter for other administrative examinations: Secondary | ICD-10-CM

## 2014-11-18 ENCOUNTER — Telehealth: Payer: Self-pay | Admitting: Neurology

## 2014-11-18 NOTE — Telephone Encounter (Signed)
I spoke to the patient about her Vitamin D intake. Patient confirmed that she is currently taking Vitamin D.

## 2014-11-25 ENCOUNTER — Encounter (HOSPITAL_COMMUNITY): Payer: Self-pay

## 2014-11-25 ENCOUNTER — Encounter (HOSPITAL_COMMUNITY)
Admission: RE | Admit: 2014-11-25 | Discharge: 2014-11-25 | Disposition: A | Discharge status: Home / Self Care | Payer: Medicaid Other | Source: Ambulatory Visit | Attending: Neurology | Admitting: Neurology

## 2014-11-25 VITALS — BP 130/73 | HR 85 | Temp 98.2°F | Resp 18 | Ht 64.0 in | Wt 180.0 lb

## 2014-11-25 DIAGNOSIS — G35 Multiple sclerosis: Secondary | ICD-10-CM | POA: Diagnosis present

## 2014-11-25 MED ORDER — SODIUM CHLORIDE 0.9 % IV SOLN
300.0000 mg | INTRAVENOUS | Status: AC
  Administered 2014-11-25: 300 mg via INTRAVENOUS
  Filled 2014-11-25: qty 15

## 2014-11-25 MED ORDER — LORATADINE 10 MG PO TABS
10.0000 mg | ORAL_TABLET | ORAL | Status: AC
  Administered 2014-11-25: 10 mg via ORAL
  Filled 2014-11-25: qty 1

## 2014-11-25 MED ORDER — ACETAMINOPHEN 500 MG PO TABS
1000.0000 mg | ORAL_TABLET | ORAL | Status: AC
  Administered 2014-11-25: 1000 mg via ORAL
  Filled 2014-11-25: qty 2

## 2014-11-25 MED ORDER — SODIUM CHLORIDE 0.9 % IV SOLN
INTRAVENOUS | Status: AC
  Administered 2014-11-25: 12:00:00 via INTRAVENOUS

## 2014-11-25 NOTE — Discharge Instructions (Signed)

## 2014-11-29 ENCOUNTER — Encounter (INDEPENDENT_AMBULATORY_CARE_PROVIDER_SITE_OTHER): Payer: Self-pay | Admitting: Neurology

## 2014-11-29 DIAGNOSIS — Z0289 Encounter for other administrative examinations: Secondary | ICD-10-CM

## 2014-12-01 ENCOUNTER — Encounter (INDEPENDENT_AMBULATORY_CARE_PROVIDER_SITE_OTHER): Payer: Self-pay

## 2014-12-01 DIAGNOSIS — Z0289 Encounter for other administrative examinations: Secondary | ICD-10-CM

## 2014-12-15 ENCOUNTER — Other Ambulatory Visit: Payer: Self-pay | Admitting: Neurology

## 2014-12-15 DIAGNOSIS — G35 Multiple sclerosis: Secondary | ICD-10-CM

## 2014-12-23 ENCOUNTER — Encounter (HOSPITAL_COMMUNITY)
Admission: RE | Admit: 2014-12-23 | Discharge: 2014-12-23 | Disposition: A | Discharge status: Home / Self Care | Payer: Medicaid Other | Source: Ambulatory Visit | Attending: Neurology | Admitting: Neurology

## 2014-12-23 ENCOUNTER — Encounter (HOSPITAL_COMMUNITY): Payer: Self-pay

## 2014-12-23 VITALS — BP 142/92 | HR 69 | Temp 97.7°F | Resp 16 | Ht 64.0 in | Wt 190.1 lb

## 2014-12-23 DIAGNOSIS — G35 Multiple sclerosis: Secondary | ICD-10-CM | POA: Diagnosis present

## 2014-12-23 MED ORDER — LORATADINE 10 MG PO TABS
10.0000 mg | ORAL_TABLET | ORAL | Status: DC
  Administered 2014-12-23: 10 mg via ORAL
  Filled 2014-12-23: qty 1

## 2014-12-23 MED ORDER — SODIUM CHLORIDE 0.9 % IV SOLN
300.0000 mg | INTRAVENOUS | Status: DC
  Administered 2014-12-23: 300 mg via INTRAVENOUS
  Filled 2014-12-23: qty 15

## 2014-12-23 MED ORDER — ACETAMINOPHEN 500 MG PO TABS
1000.0000 mg | ORAL_TABLET | ORAL | Status: DC
  Administered 2014-12-23: 1000 mg via ORAL
  Filled 2014-12-23: qty 2

## 2014-12-23 MED ORDER — SODIUM CHLORIDE 0.9 % IV SOLN
INTRAVENOUS | Status: DC
  Administered 2014-12-23: 10:00:00 via INTRAVENOUS

## 2014-12-23 NOTE — Progress Notes (Signed)
Uneventful infusion of TYSABRI. Pt stayed only 30 minutes of TOUCH suggested post infusion 1 hour observation. Pt states she will contact her neurologist for any questions or concerns

## 2014-12-27 ENCOUNTER — Encounter (INDEPENDENT_AMBULATORY_CARE_PROVIDER_SITE_OTHER): Payer: Self-pay

## 2014-12-27 DIAGNOSIS — Z0289 Encounter for other administrative examinations: Secondary | ICD-10-CM

## 2015-01-20 ENCOUNTER — Encounter (HOSPITAL_COMMUNITY): Admission: RE | Admit: 2015-01-20 | Payer: Medicaid Other | Source: Ambulatory Visit

## 2015-01-25 ENCOUNTER — Encounter (INDEPENDENT_AMBULATORY_CARE_PROVIDER_SITE_OTHER): Payer: Self-pay

## 2015-01-25 DIAGNOSIS — Z0289 Encounter for other administrative examinations: Secondary | ICD-10-CM

## 2015-01-30 ENCOUNTER — Telehealth: Payer: Self-pay

## 2015-01-30 NOTE — Telephone Encounter (Signed)
I spoke to the patient in re her [facility] [place] visit on [id number]. We rescheduled the patient from 9:30 to 10:00h.

## 2015-01-31 ENCOUNTER — Encounter (HOSPITAL_COMMUNITY): Payer: Self-pay

## 2015-01-31 ENCOUNTER — Encounter (HOSPITAL_COMMUNITY)
Admission: RE | Admit: 2015-01-31 | Discharge: 2015-01-31 | Disposition: A | Discharge status: Home / Self Care | Payer: Medicaid Other | Source: Ambulatory Visit | Attending: Neurology | Admitting: Neurology

## 2015-01-31 VITALS — BP 138/87 | HR 66 | Temp 97.9°F | Resp 16

## 2015-01-31 DIAGNOSIS — G35 Multiple sclerosis: Secondary | ICD-10-CM | POA: Diagnosis present

## 2015-01-31 MED ORDER — ACETAMINOPHEN 500 MG PO TABS
1000.0000 mg | ORAL_TABLET | ORAL | Status: DC
  Administered 2015-01-31: 1000 mg via ORAL
  Filled 2015-01-31: qty 2

## 2015-01-31 MED ORDER — LORATADINE 10 MG PO TABS
10.0000 mg | ORAL_TABLET | ORAL | Status: DC
  Administered 2015-01-31: 10 mg via ORAL
  Filled 2015-01-31: qty 1

## 2015-01-31 MED ORDER — NATALIZUMAB 300 MG/15ML IV CONC
300.0000 mg | INTRAVENOUS | Status: DC
  Administered 2015-01-31: 300 mg via INTRAVENOUS
  Filled 2015-01-31: qty 15

## 2015-01-31 MED ORDER — SODIUM CHLORIDE 0.9 % IV SOLN
INTRAVENOUS | Status: DC
  Administered 2015-01-31: 09:00:00 via INTRAVENOUS

## 2015-01-31 NOTE — Discharge Instructions (Signed)

## 2015-02-21 ENCOUNTER — Encounter (INDEPENDENT_AMBULATORY_CARE_PROVIDER_SITE_OTHER): Payer: Self-pay | Admitting: Neurology

## 2015-02-21 DIAGNOSIS — Z0289 Encounter for other administrative examinations: Secondary | ICD-10-CM

## 2015-02-28 ENCOUNTER — Encounter (INDEPENDENT_AMBULATORY_CARE_PROVIDER_SITE_OTHER): Payer: Self-pay | Admitting: Neurology

## 2015-02-28 ENCOUNTER — Other Ambulatory Visit: Payer: Self-pay | Admitting: Neurology

## 2015-02-28 DIAGNOSIS — G35 Multiple sclerosis: Secondary | ICD-10-CM

## 2015-02-28 DIAGNOSIS — Z0289 Encounter for other administrative examinations: Secondary | ICD-10-CM

## 2015-03-01 ENCOUNTER — Encounter (HOSPITAL_COMMUNITY)
Admission: RE | Admit: 2015-03-01 | Discharge: 2015-03-01 | Disposition: A | Discharge status: Home / Self Care | Payer: Medicaid Other | Source: Ambulatory Visit | Attending: Neurology | Admitting: Neurology

## 2015-03-01 ENCOUNTER — Encounter (HOSPITAL_COMMUNITY): Payer: Self-pay

## 2015-03-01 VITALS — BP 132/90 | HR 75 | Temp 98.4°F | Resp 20

## 2015-03-01 DIAGNOSIS — G35 Multiple sclerosis: Secondary | ICD-10-CM | POA: Insufficient documentation

## 2015-03-01 MED ORDER — LORATADINE 10 MG PO TABS
10.0000 mg | ORAL_TABLET | ORAL | Status: AC
  Administered 2015-03-01: 10 mg via ORAL
  Filled 2015-03-01: qty 1

## 2015-03-01 MED ORDER — ACETAMINOPHEN 500 MG PO TABS
1000.0000 mg | ORAL_TABLET | ORAL | Status: AC
  Administered 2015-03-01: 1000 mg via ORAL
  Filled 2015-03-01: qty 2

## 2015-03-01 MED ORDER — SODIUM CHLORIDE 0.9 % IV SOLN
300.0000 mg | INTRAVENOUS | Status: AC
  Administered 2015-03-01: 300 mg via INTRAVENOUS
  Filled 2015-03-01: qty 15

## 2015-03-01 MED ORDER — SODIUM CHLORIDE 0.9 % IV SOLN
INTRAVENOUS | Status: AC
  Administered 2015-03-01: 13:00:00 via INTRAVENOUS

## 2015-03-01 NOTE — Progress Notes (Signed)
Post-infusion tysabri, pt. Stayed 25 minutes post-infusion, pt. To follow-up with doctor with any problems.

## 2015-03-01 NOTE — Discharge Instructions (Signed)

## 2015-03-08 ENCOUNTER — Ambulatory Visit (INDEPENDENT_AMBULATORY_CARE_PROVIDER_SITE_OTHER): Payer: Self-pay | Admitting: Neurology

## 2015-03-08 DIAGNOSIS — I10 Essential (primary) hypertension: Secondary | ICD-10-CM | POA: Insufficient documentation

## 2015-03-08 DIAGNOSIS — G35 Multiple sclerosis: Secondary | ICD-10-CM

## 2015-03-08 MED ORDER — LISINOPRIL 10 MG PO TABS: 10.0000 mg | ORAL_TABLET | Freq: Every day | ORAL | Status: DC

## 2015-03-08 NOTE — Progress Notes (Signed)
Patient came in for [facility] 9-21 follow-up visit  She was noted to have elevated blood pressure 150/100, despite taking her morning dose of atenolol 50 mg every day  I have added on lisinopril 10 mg every day, advise patient document daily blood pressure,

## 2015-03-13 ENCOUNTER — Encounter (INDEPENDENT_AMBULATORY_CARE_PROVIDER_SITE_OTHER): Payer: Self-pay

## 2015-03-13 DIAGNOSIS — Z0289 Encounter for other administrative examinations: Secondary | ICD-10-CM

## 2015-03-15 ENCOUNTER — Ambulatory Visit
Admission: RE | Admit: 2015-03-15 | Discharge: 2015-03-15 | Disposition: A | Discharge status: Home / Self Care | Payer: Medicaid Other | Source: Ambulatory Visit | Attending: Neurology | Admitting: Neurology

## 2015-03-15 ENCOUNTER — Encounter (INDEPENDENT_AMBULATORY_CARE_PROVIDER_SITE_OTHER): Payer: Medicaid Other | Admitting: Diagnostic Neuroimaging

## 2015-03-15 DIAGNOSIS — G35 Multiple sclerosis: Secondary | ICD-10-CM | POA: Diagnosis not present

## 2015-03-15 MED ORDER — GADOBENATE DIMEGLUMINE 529 MG/ML IV SOLN
16.0000 mL | Freq: Once | INTRAVENOUS | Status: AC | PRN
  Administered 2015-03-15: 16 mL via INTRAVENOUS

## 2015-03-16 ENCOUNTER — Telehealth: Payer: Self-pay | Admitting: Neurology

## 2015-03-16 NOTE — Telephone Encounter (Signed)
Julie Pope: Please call patient, MRI of the brain showed evidence of MS, no change compared to previous scans  IMPRESSION:  Abnormal MRI brain (with and without) demonstrating: 1. Moderate round and ovoid, supratentorial and infratentorial chronic demyelinating plaques.  2. No acute plaques. 3. No change from MRI on 07/26/14.

## 2015-03-16 NOTE — Telephone Encounter (Signed)
Spoke to [place] - she is aware of results.

## 2015-03-22 ENCOUNTER — Ambulatory Visit (INDEPENDENT_AMBULATORY_CARE_PROVIDER_SITE_OTHER): Payer: Self-pay | Admitting: Neurology

## 2015-03-22 VITALS — BP 132/101 | HR 67 | Temp 98.2°F | Resp 12

## 2015-03-22 DIAGNOSIS — Z0289 Encounter for other administrative examinations: Secondary | ICD-10-CM

## 2015-03-22 DIAGNOSIS — G35 Multiple sclerosis: Secondary | ICD-10-CM

## 2015-03-22 MED ORDER — LISINOPRIL 10 MG PO TABS: 20.0000 mg | ORAL_TABLET | Freq: Every day | ORAL | Status: DC

## 2015-03-22 MED ORDER — BACLOFEN 10 MG PO TABS: ORAL_TABLET | ORAL | Status: DC

## 2015-03-27 ENCOUNTER — Telehealth: Payer: Self-pay | Admitting: Neurology

## 2015-03-27 NOTE — Telephone Encounter (Signed)
[facility], needs new orders for Tysabri. Please call in the morning.

## 2015-03-27 NOTE — Progress Notes (Signed)
Patient was seen for Visit 11 for the [facility] trial. All of the Visit 11 procedures were completed: physical and neurological exam, ECG, Clinical Global Impression of Change (CGIC), Subject's Global Impression of Severity (SGIC), clinical lab tests, urine pregnancy test, C-SSRS, and study medication compliance. I explained to the patient that this would be her final visit for the study. Dr. Terrace Arabia prescribed baclofen (LIORESAL) 10 MG tablet TID. All of her questions were answered. I thanked her for interest and participation in the study.

## 2015-03-27 NOTE — Telephone Encounter (Signed)
Need new hospital orders placed.

## 2015-03-28 ENCOUNTER — Other Ambulatory Visit: Payer: Self-pay | Admitting: Neurology

## 2015-03-28 DIAGNOSIS — G35 Multiple sclerosis: Secondary | ICD-10-CM

## 2015-03-28 NOTE — Telephone Encounter (Signed)
Dee called - she needs new Tysabri hospital orders placed (sign and hold).  I can call her back once orders are placed.

## 2015-03-28 NOTE — Progress Notes (Signed)
Reviewed, agree 

## 2015-03-28 NOTE — Telephone Encounter (Signed)
Tysarbri order was placed 

## 2015-03-29 ENCOUNTER — Encounter (HOSPITAL_COMMUNITY)
Admission: RE | Admit: 2015-03-29 | Payer: Medicaid Other | Source: Ambulatory Visit | Attending: Neurology | Admitting: Neurology

## 2015-03-29 ENCOUNTER — Telehealth: Payer: Self-pay | Admitting: Neurology

## 2015-03-29 NOTE — Progress Notes (Signed)
I have reviewed and agreed above plan. 

## 2015-03-29 NOTE — Telephone Encounter (Signed)
Julie Pope, would you please check on this patient

## 2015-03-29 NOTE — Telephone Encounter (Signed)
Spoke to [place] - she got her appointment time confused - thought it was 03/31/15.  She is going to call to reschedule.

## 2015-03-29 NOTE — Telephone Encounter (Signed)
-----   Message from Katherene Ponto, [place] sent at 03/29/2015 12:49 PM EDT ----- Regarding: No Call/ No Show Contact: [phone] Good afternoon Dr. Terrace Arabia and Marcelino Duster. This is Electrical engineer from [employer]. This is just a courtesy message to inform you that Ms. Kube did not arrive for her scheduled Tysabri infusion appointment for today. If you decide to follow-up with patient and plan to re-schedule at a later date; please do not hesitate to contact us as soon as you would like to do so.   Thank you in advance,  [facility]

## 2015-04-11 ENCOUNTER — Encounter (HOSPITAL_COMMUNITY): Payer: Self-pay

## 2015-04-11 ENCOUNTER — Encounter (HOSPITAL_COMMUNITY)
Admission: RE | Admit: 2015-04-11 | Discharge: 2015-04-11 | Disposition: A | Discharge status: Home / Self Care | Payer: Medicaid Other | Source: Ambulatory Visit | Attending: Neurology | Admitting: Neurology

## 2015-04-11 VITALS — BP 134/95 | HR 69 | Temp 97.9°F | Resp 18 | Wt 192.2 lb

## 2015-04-11 DIAGNOSIS — G35 Multiple sclerosis: Secondary | ICD-10-CM | POA: Insufficient documentation

## 2015-04-11 MED ORDER — ACETAMINOPHEN 500 MG PO TABS
1000.0000 mg | ORAL_TABLET | ORAL | Status: DC
  Administered 2015-04-11: 1000 mg via ORAL
  Filled 2015-04-11: qty 2

## 2015-04-11 MED ORDER — SODIUM CHLORIDE 0.9 % IV SOLN
INTRAVENOUS | Status: DC
  Administered 2015-04-11: 09:00:00 via INTRAVENOUS

## 2015-04-11 MED ORDER — SODIUM CHLORIDE 0.9 % IV SOLN
300.0000 mg | INTRAVENOUS | Status: DC
  Administered 2015-04-11: 300 mg via INTRAVENOUS
  Filled 2015-04-11: qty 15

## 2015-04-11 MED ORDER — LORATADINE 10 MG PO TABS
10.0000 mg | ORAL_TABLET | ORAL | Status: DC
  Administered 2015-04-11: 10 mg via ORAL
  Filled 2015-04-11: qty 1

## 2015-04-17 ENCOUNTER — Telehealth: Payer: Self-pay | Admitting: Neurology

## 2015-04-17 NOTE — Telephone Encounter (Signed)
Patient is calling about her social security claim.  Could Dr. Terrace Arabia please call?  Thanks!

## 2015-04-17 NOTE — Telephone Encounter (Signed)
Spoke to [place] - she wanted to let us know she is going for an appeal for her social security case and may need additional paperwork completed.

## 2015-05-01 ENCOUNTER — Encounter (HOSPITAL_COMMUNITY): Payer: Medicaid Other

## 2015-05-09 ENCOUNTER — Encounter (HOSPITAL_COMMUNITY)
Admission: RE | Admit: 2015-05-09 | Discharge: 2015-05-09 | Disposition: A | Discharge status: Home / Self Care | Payer: Medicaid Other | Source: Ambulatory Visit | Attending: Neurology | Admitting: Neurology

## 2015-05-09 ENCOUNTER — Encounter (HOSPITAL_COMMUNITY): Payer: Self-pay

## 2015-05-09 VITALS — BP 123/78 | HR 69 | Temp 97.9°F | Resp 20 | Ht 64.0 in | Wt 192.2 lb

## 2015-05-09 DIAGNOSIS — G35 Multiple sclerosis: Secondary | ICD-10-CM | POA: Insufficient documentation

## 2015-05-09 MED ORDER — SODIUM CHLORIDE 0.9 % IV SOLN
INTRAVENOUS | Status: DC
  Administered 2015-05-09: 10:00:00 via INTRAVENOUS

## 2015-05-09 MED ORDER — LORATADINE 10 MG PO TABS
10.0000 mg | ORAL_TABLET | ORAL | Status: DC
  Administered 2015-05-09: 10 mg via ORAL
  Filled 2015-05-09: qty 1

## 2015-05-09 MED ORDER — SODIUM CHLORIDE 0.9 % IV SOLN
300.0000 mg | INTRAVENOUS | Status: DC
  Administered 2015-05-09: 300 mg via INTRAVENOUS
  Filled 2015-05-09: qty 15

## 2015-05-09 MED ORDER — ACETAMINOPHEN 500 MG PO TABS
1000.0000 mg | ORAL_TABLET | ORAL | Status: DC
  Administered 2015-05-09: 1000 mg via ORAL
  Filled 2015-05-09: qty 2

## 2015-05-09 NOTE — Progress Notes (Signed)
Post-infusion tysabri, pt. Stayed 15 minutes post-infusion, pt. To follow-up with doctor with any problems.

## 2015-05-09 NOTE — Discharge Instructions (Signed)

## 2015-06-06 ENCOUNTER — Encounter (HOSPITAL_COMMUNITY)
Admission: RE | Admit: 2015-06-06 | Discharge: 2015-06-06 | Disposition: A | Discharge status: Home / Self Care | Payer: Medicaid Other | Source: Ambulatory Visit | Attending: Neurology | Admitting: Neurology

## 2015-06-06 VITALS — BP 122/83 | HR 74 | Temp 98.2°F | Resp 20 | Ht 64.0 in | Wt 193.4 lb

## 2015-06-06 DIAGNOSIS — G35 Multiple sclerosis: Secondary | ICD-10-CM | POA: Diagnosis present

## 2015-06-06 MED ORDER — ACETAMINOPHEN 500 MG PO TABS
1000.0000 mg | ORAL_TABLET | ORAL | Status: AC
  Administered 2015-06-06: 1000 mg via ORAL
  Filled 2015-06-06: qty 2

## 2015-06-06 MED ORDER — SODIUM CHLORIDE 0.9 % IV SOLN
INTRAVENOUS | Status: AC
  Administered 2015-06-06: 10:00:00 via INTRAVENOUS

## 2015-06-06 MED ORDER — SODIUM CHLORIDE 0.9 % IV SOLN
300.0000 mg | INTRAVENOUS | Status: AC
  Administered 2015-06-06: 300 mg via INTRAVENOUS
  Filled 2015-06-06: qty 15

## 2015-06-06 MED ORDER — LORATADINE 10 MG PO TABS
10.0000 mg | ORAL_TABLET | ORAL | Status: AC
  Administered 2015-06-06: 10 mg via ORAL
  Filled 2015-06-06: qty 1

## 2015-06-06 NOTE — Progress Notes (Signed)
Uneventful infusion of TYSABRI.Pt stayed 20 minutes of the TOUCH recommended post TYSABRI infusion recommendation. She will contact her neurologist for any questions or concerns

## 2015-06-29 ENCOUNTER — Other Ambulatory Visit: Payer: Self-pay | Admitting: Neurology

## 2015-06-29 DIAGNOSIS — G35 Multiple sclerosis: Secondary | ICD-10-CM

## 2015-07-04 ENCOUNTER — Encounter (HOSPITAL_COMMUNITY)
Admission: RE | Admit: 2015-07-04 | Discharge: 2015-07-04 | Disposition: A | Discharge status: Home / Self Care | Payer: Medicaid Other | Source: Ambulatory Visit | Attending: Neurology | Admitting: Neurology

## 2015-07-04 ENCOUNTER — Telehealth: Payer: Self-pay | Admitting: Neurology

## 2015-07-04 ENCOUNTER — Encounter (INDEPENDENT_AMBULATORY_CARE_PROVIDER_SITE_OTHER): Payer: Self-pay

## 2015-07-04 ENCOUNTER — Other Ambulatory Visit: Payer: Self-pay | Admitting: *Deleted

## 2015-07-04 ENCOUNTER — Encounter (HOSPITAL_COMMUNITY): Payer: Self-pay

## 2015-07-04 VITALS — BP 136/97 | HR 71 | Temp 98.1°F | Resp 16 | Ht 64.0 in | Wt 193.6 lb

## 2015-07-04 DIAGNOSIS — G35 Multiple sclerosis: Secondary | ICD-10-CM | POA: Diagnosis not present

## 2015-07-04 MED ORDER — ACETAMINOPHEN 500 MG PO TABS
1000.0000 mg | ORAL_TABLET | ORAL | Status: DC
  Administered 2015-07-04: 1000 mg via ORAL
  Filled 2015-07-04: qty 2

## 2015-07-04 MED ORDER — SODIUM CHLORIDE 0.9 % IV SOLN
INTRAVENOUS | Status: DC
  Administered 2015-07-04: 12:00:00 via INTRAVENOUS

## 2015-07-04 MED ORDER — SODIUM CHLORIDE 0.9 % IV SOLN
300.0000 mg | INTRAVENOUS | Status: DC
  Administered 2015-07-04: 300 mg via INTRAVENOUS
  Filled 2015-07-04: qty 15

## 2015-07-04 MED ORDER — LORATADINE 10 MG PO TABS
10.0000 mg | ORAL_TABLET | ORAL | Status: DC
  Administered 2015-07-04: 10 mg via ORAL
  Filled 2015-07-04: qty 1

## 2015-07-04 NOTE — Discharge Instructions (Signed)

## 2015-07-04 NOTE — Progress Notes (Signed)
Solumedrol was entered as scheduled but called and clarified and it is to be PRN. Will change on orders. Spoke with Julie Pope.

## 2015-07-04 NOTE — Telephone Encounter (Signed)
Spoke to [place] - orders clarified.

## 2015-07-04 NOTE — Progress Notes (Signed)
Patient stayed 20 minutes of post infusion time. Aware of suggested observation policy for one hour observation. She will call Md with any problems or questions.

## 2015-07-04 NOTE — Telephone Encounter (Signed)
[facility] called and needs order clarification on Tysabri order. Pt is at short stay right now for treatment. [phone] Darl Pikes.

## 2015-08-01 ENCOUNTER — Encounter (HOSPITAL_COMMUNITY)
Admission: RE | Admit: 2015-08-01 | Discharge: 2015-08-01 | Disposition: A | Discharge status: Home / Self Care | Payer: Medicaid Other | Source: Ambulatory Visit | Attending: Neurology | Admitting: Neurology

## 2015-08-01 ENCOUNTER — Encounter (HOSPITAL_COMMUNITY): Payer: Self-pay

## 2015-08-01 VITALS — BP 128/91 | HR 65 | Temp 98.0°F | Resp 16 | Ht 64.0 in | Wt 190.4 lb

## 2015-08-01 DIAGNOSIS — G35 Multiple sclerosis: Secondary | ICD-10-CM | POA: Diagnosis present

## 2015-08-01 MED ORDER — NATALIZUMAB 300 MG/15ML IV CONC
300.0000 mg | INTRAVENOUS | Status: DC
  Administered 2015-08-01: 300 mg via INTRAVENOUS
  Filled 2015-08-01: qty 15

## 2015-08-01 MED ORDER — LORATADINE 10 MG PO TABS
10.0000 mg | ORAL_TABLET | ORAL | Status: DC
  Administered 2015-08-01: 10 mg via ORAL
  Filled 2015-08-01: qty 1

## 2015-08-01 MED ORDER — ACETAMINOPHEN 500 MG PO TABS
1000.0000 mg | ORAL_TABLET | ORAL | Status: DC
  Administered 2015-08-01: 1000 mg via ORAL
  Filled 2015-08-01: qty 2

## 2015-08-01 MED ORDER — SODIUM CHLORIDE 0.9 % IV SOLN
INTRAVENOUS | Status: DC
  Administered 2015-08-01: 10:00:00 via INTRAVENOUS

## 2015-08-01 NOTE — Discharge Instructions (Signed)

## 2015-08-01 NOTE — Progress Notes (Signed)
Post-infusion Tysabri , pt. Stayed 15 minutes post-infusion,pt. To follow-up with MD with any problems.

## 2015-08-29 ENCOUNTER — Encounter (HOSPITAL_COMMUNITY): Payer: Self-pay

## 2015-08-29 ENCOUNTER — Encounter (HOSPITAL_COMMUNITY)
Admission: RE | Admit: 2015-08-29 | Discharge: 2015-08-29 | Disposition: A | Discharge status: Home / Self Care | Payer: Medicaid Other | Source: Ambulatory Visit | Attending: Neurology | Admitting: Neurology

## 2015-08-29 VITALS — BP 124/93 | HR 72 | Temp 98.0°F | Resp 16 | Ht 64.0 in | Wt 189.8 lb

## 2015-08-29 DIAGNOSIS — G35 Multiple sclerosis: Secondary | ICD-10-CM

## 2015-08-29 MED ORDER — SODIUM CHLORIDE 0.9 % IV SOLN
INTRAVENOUS | Status: AC
  Administered 2015-08-29: 09:00:00 via INTRAVENOUS

## 2015-08-29 MED ORDER — LORATADINE 10 MG PO TABS
10.0000 mg | ORAL_TABLET | ORAL | Status: AC
  Administered 2015-08-29: 10 mg via ORAL
  Filled 2015-08-29: qty 1

## 2015-08-29 MED ORDER — ACETAMINOPHEN 500 MG PO TABS
1000.0000 mg | ORAL_TABLET | ORAL | Status: AC
  Administered 2015-08-29: 1000 mg via ORAL
  Filled 2015-08-29: qty 2

## 2015-08-29 MED ORDER — SODIUM CHLORIDE 0.9 % IV SOLN
300.0000 mg | INTRAVENOUS | Status: DC
  Administered 2015-08-29: 300 mg via INTRAVENOUS
  Filled 2015-08-29: qty 15

## 2015-09-26 ENCOUNTER — Encounter (HOSPITAL_COMMUNITY): Payer: Self-pay

## 2015-09-26 ENCOUNTER — Encounter (HOSPITAL_COMMUNITY)
Admission: RE | Admit: 2015-09-26 | Discharge: 2015-09-26 | Disposition: A | Discharge status: Home / Self Care | Payer: Medicaid Other | Source: Ambulatory Visit | Attending: Neurology | Admitting: Neurology

## 2015-09-26 VITALS — BP 127/87 | HR 76 | Temp 97.8°F | Resp 18 | Ht 64.0 in | Wt 191.6 lb

## 2015-09-26 DIAGNOSIS — G35 Multiple sclerosis: Secondary | ICD-10-CM

## 2015-09-26 MED ORDER — SODIUM CHLORIDE 0.9 % IV SOLN
INTRAVENOUS | Status: AC
  Administered 2015-09-26: 10:00:00 via INTRAVENOUS

## 2015-09-26 MED ORDER — SODIUM CHLORIDE 0.9 % IV SOLN
300.0000 mg | INTRAVENOUS | Status: DC
  Administered 2015-09-26: 300 mg via INTRAVENOUS
  Filled 2015-09-26: qty 15

## 2015-09-26 MED ORDER — ACETAMINOPHEN 500 MG PO TABS
1000.0000 mg | ORAL_TABLET | ORAL | Status: AC
Start: 2015-09-26 — End: 2015-09-26
  Administered 2015-09-26: 1000 mg via ORAL
  Filled 2015-09-26: qty 2

## 2015-09-26 MED ORDER — LORATADINE 10 MG PO TABS
10.0000 mg | ORAL_TABLET | ORAL | Status: AC
  Administered 2015-09-26: 10 mg via ORAL
  Filled 2015-09-26: qty 1

## 2015-10-20 ENCOUNTER — Other Ambulatory Visit: Payer: Self-pay | Admitting: Neurology

## 2015-10-20 ENCOUNTER — Telehealth: Payer: Self-pay | Admitting: *Deleted

## 2015-10-20 DIAGNOSIS — G35 Multiple sclerosis: Secondary | ICD-10-CM

## 2015-10-20 NOTE — Telephone Encounter (Signed)
Caller [facility] [id number]  Patient Julie Pope, Julie Pope      Pt's Dr Terrace Arabia          Area Code 336 Phone# [phone] * DOB [phone]    RE NUSRE NEEDS PT ORDERS

## 2015-10-20 NOTE — Telephone Encounter (Signed)
I reordered tysarbri infusion

## 2015-10-24 ENCOUNTER — Encounter (HOSPITAL_COMMUNITY): Payer: Self-pay

## 2015-10-24 ENCOUNTER — Encounter (HOSPITAL_COMMUNITY)
Admission: RE | Admit: 2015-10-24 | Discharge: 2015-10-24 | Disposition: A | Discharge status: Home / Self Care | Payer: Medicaid Other | Source: Ambulatory Visit | Attending: Neurology | Admitting: Neurology

## 2015-10-24 VITALS — BP 121/88 | HR 72 | Temp 98.8°F | Resp 16 | Ht 64.0 in | Wt 194.6 lb

## 2015-10-24 DIAGNOSIS — G35 Multiple sclerosis: Secondary | ICD-10-CM | POA: Diagnosis present

## 2015-10-24 MED ORDER — SODIUM CHLORIDE 0.9 % IV SOLN
300.0000 mg | INTRAVENOUS | Status: DC
  Administered 2015-10-24: 300 mg via INTRAVENOUS
  Filled 2015-10-24: qty 15

## 2015-10-24 MED ORDER — LORATADINE 10 MG PO TABS
10.0000 mg | ORAL_TABLET | ORAL | Status: DC
  Administered 2015-10-24: 10 mg via ORAL
  Filled 2015-10-24: qty 1

## 2015-10-24 MED ORDER — ACETAMINOPHEN 500 MG PO TABS
1000.0000 mg | ORAL_TABLET | ORAL | Status: DC
  Administered 2015-10-24: 1000 mg via ORAL
  Filled 2015-10-24: qty 2

## 2015-10-24 MED ORDER — SODIUM CHLORIDE 0.9 % IV SOLN
INTRAVENOUS | Status: DC
  Administered 2015-10-24: 10:00:00 via INTRAVENOUS

## 2015-10-24 NOTE — Discharge Instructions (Signed)
Natalizumab injection °What is this medicine? °NATALIZUMAB (na ta LIZ you mab) is used to treat relapsing multiple sclerosis. This drug is not a cure. It is also used to treat Crohn's disease. °This medicine may be used for other purposes; ask your health care provider or pharmacist if you have questions. °What should I tell my health care provider before I take this medicine? °They need to know if you have any of these conditions: °-immune system problems °-progressive multifocal leukoencephalopathy (PML) °-an unusual or allergic reaction to natalizumab, other medicines, foods, dyes, or preservatives °-pregnant or trying to get pregnant °-breast-feeding °How should I use this medicine? °This medicine is for infusion into a vein. It is given by a health care professional in a hospital or clinic setting. °A special MedGuide will be given to you by the pharmacist with each prescription and refill. Be sure to read this information carefully each time. °Talk to your pediatrician regarding the use of this medicine in children. This medicine is not approved for use in children. °Overdosage: If you think you have taken too much of this medicine contact a poison control center or emergency room at once. °NOTE: This medicine is only for you. Do not share this medicine with others. °What if I miss a dose? °It is important not to miss your dose. Call your doctor or health care professional if you are unable to keep an appointment. °What may interact with this medicine? °-azathioprine °-cyclosporine °-interferon °-6-mercaptopurine °-methotrexate °-steroid medicines like prednisone or cortisone °-TNF-alpha inhibitors like adalimumab, etanercept, and infliximab °-vaccines °This list may not describe all possible interactions. Give your health care provider a list of all the medicines, herbs, non-prescription drugs, or dietary supplements you use. Also tell them if you smoke, drink alcohol, or use illegal drugs. Some items may  interact with your medicine. °What should I watch for while using this medicine? °Your condition will be monitored carefully while you are receiving this medicine. Visit your doctor for regular check ups. Tell your doctor or healthcare professional if your symptoms do not start to get better or if they get worse. °Stay away from people who are sick. Call your doctor or health care professional for advice if you get a fever, chills or sore throat, or other symptoms of a cold or flu. Do not treat yourself. °In some patients, this medicine may cause a serious brain infection that may cause death. If you have any problems seeing, thinking, speaking, walking, or standing, tell your doctor right away. If you cannot reach your doctor, get urgent medical care. °What side effects may I notice from receiving this medicine? °Side effects that you should report to your doctor or health care professional as soon as possible: °-allergic reactions like skin rash, itching or hives, swelling of the face, lips, or tongue °-breathing problems °-changes in vision °-chest pain °-dark urine °-depression, feelings of sadness °-dizziness °-general ill feeling or flu-like symptoms °-irregular, missed, or painful menstrual periods °-light-colored stools °-loss of appetite, nausea °-muscle weakness °-problems with balance, talking, or walking °-right upper belly pain °-unusually weak or tired °-yellowing of the eyes or skin °Side effects that usually do not require medical attention (report to your doctor or health care professional if they continue or are bothersome): °-aches, pains °-headache °-stomach upset °-tiredness °This list may not describe all possible side effects. Call your doctor for medical advice about side effects. You may report side effects to FDA at 1-800-FDA-1088. °Where should I keep my medicine? °This drug   is given in a hospital or clinic and will not be stored at home. °NOTE: This sheet is a summary. It may not cover  all possible information. If you have questions about this medicine, talk to your doctor, pharmacist, or health care provider. °  °© 2016, Elsevier/Gold Standard. (2008-07-09 13:33:21) °Multiple Sclerosis °Multiple sclerosis (MS) is a disease of the central nervous system. It leads to the loss of the insulating covering of the nerves (myelin sheath) of your brain. When this happens, brain signals do not get sent properly or may not get sent at all. The age of onset of MS varies.  °CAUSES °The cause of MS is unknown. However, it is more common in the northern [place] than in the southern [place]. °RISK FACTORS °There is a higher number of women with MS than men. MS is not an illness that is passed down to you from your family members (inherited). However, your risk of MS is higher if you have a relative with MS. °SIGNS AND SYMPTOMS  °The symptoms of MS occur in episodes or attacks. These attacks may last weeks to months. There may be long periods of almost no symptoms between attacks. The symptoms of MS vary. This is because of the many different ways it affects the central nervous system. The main symptoms of MS include: °· Vision problems and eye pain. °· Numbness. °· Weakness. °· Inability to move your arms, hands, feet, or legs (paralysis). °· Balance problems. °· Tremors. °DIAGNOSIS  °Your health care provider can diagnose MS with the help of imaging exams and lab tests. These may include specialized X-ray exams and spinal fluid tests. The best imaging exam to confirm a diagnosis of MS is an MRI. °TREATMENT  °There is no known cure for MS, but there are medicines that can decrease the number and frequency of attacks. Steroids are often used for short-term relief. Physical and occupational therapy may also help. There are also many new alternative or complementary treatments available to help control the symptoms of MS. Ask your health care provider if any of these other options are right for  you. °HOME CARE INSTRUCTIONS  °· Take medicines as directed by your health care provider. °· Exercise as directed by your health care provider. °SEEK MEDICAL CARE IF: °You begin to feel depressed. °SEEK IMMEDIATE MEDICAL CARE IF: °· You develop paralysis. °· You have problems with bladder, bowel, or sexual function. °· You develop mental changes, such as forgetfulness or mood swings. °· You have a period of uncontrolled movements (seizure). °  °This information is not intended to replace advice given to you by your health care provider. Make sure you discuss any questions you have with your health care provider. °  °Document Released: 05/17/2000 Document Revised: 05/25/2013 Document Reviewed: 01/25/2013 °Elsevier Interactive Patient Education ©2016 Elsevier Inc. ° ° °

## 2015-11-21 ENCOUNTER — Encounter (HOSPITAL_COMMUNITY): Payer: Self-pay

## 2015-11-21 ENCOUNTER — Encounter (HOSPITAL_COMMUNITY)
Admission: RE | Admit: 2015-11-21 | Discharge: 2015-11-21 | Disposition: A | Discharge status: Home / Self Care | Payer: Medicaid Other | Source: Ambulatory Visit | Attending: Neurology | Admitting: Neurology

## 2015-11-21 VITALS — BP 124/94 | HR 62 | Temp 98.2°F | Resp 16 | Ht 64.0 in | Wt 197.2 lb

## 2015-11-21 DIAGNOSIS — G35 Multiple sclerosis: Secondary | ICD-10-CM | POA: Insufficient documentation

## 2015-11-21 MED ORDER — LORATADINE 10 MG PO TABS
10.0000 mg | ORAL_TABLET | ORAL | Status: DC
  Administered 2015-11-21: 10 mg via ORAL
  Filled 2015-11-21: qty 1

## 2015-11-21 MED ORDER — ACETAMINOPHEN 500 MG PO TABS
1000.0000 mg | ORAL_TABLET | ORAL | Status: DC
  Administered 2015-11-21: 1000 mg via ORAL
  Filled 2015-11-21: qty 2

## 2015-11-21 MED ORDER — SODIUM CHLORIDE 0.9 % IV SOLN
INTRAVENOUS | Status: DC
  Administered 2015-11-21: 08:00:00 via INTRAVENOUS

## 2015-11-21 MED ORDER — NATALIZUMAB 300 MG/15ML IV CONC
300.0000 mg | INTRAVENOUS | Status: DC
  Administered 2015-11-21: 300 mg via INTRAVENOUS
  Filled 2015-11-21: qty 15

## 2015-11-21 NOTE — Progress Notes (Signed)
Uneventful Tysabri infusion.  Pt stayed 15 min. Post infusion.  Pt knows to call MD with questions/concerns.

## 2015-11-21 NOTE — Discharge Instructions (Signed)

## 2015-12-21 ENCOUNTER — Encounter (HOSPITAL_COMMUNITY): Payer: Self-pay

## 2015-12-21 ENCOUNTER — Encounter (HOSPITAL_COMMUNITY)
Admission: RE | Admit: 2015-12-21 | Discharge: 2015-12-21 | Disposition: A | Discharge status: Home / Self Care | Payer: Medicaid Other | Source: Ambulatory Visit | Attending: Neurology | Admitting: Neurology

## 2015-12-21 VITALS — BP 132/90 | HR 71 | Temp 97.9°F | Resp 16

## 2015-12-21 DIAGNOSIS — G35 Multiple sclerosis: Secondary | ICD-10-CM | POA: Diagnosis present

## 2015-12-21 MED ORDER — SODIUM CHLORIDE 0.9 % IV SOLN
INTRAVENOUS | Status: AC
  Administered 2015-12-21: 09:00:00 via INTRAVENOUS

## 2015-12-21 MED ORDER — ACETAMINOPHEN 500 MG PO TABS
1000.0000 mg | ORAL_TABLET | ORAL | Status: AC
  Administered 2015-12-21: 1000 mg via ORAL
  Filled 2015-12-21: qty 2

## 2015-12-21 MED ORDER — SODIUM CHLORIDE 0.9 % IV SOLN
300.0000 mg | INTRAVENOUS | Status: AC
  Administered 2015-12-21: 300 mg via INTRAVENOUS
  Filled 2015-12-21: qty 15

## 2015-12-21 MED ORDER — LORATADINE 10 MG PO TABS
10.0000 mg | ORAL_TABLET | ORAL | Status: AC
Start: 2015-12-21 — End: 2015-12-21
  Administered 2015-12-21: 10 mg via ORAL
  Filled 2015-12-21: qty 1

## 2015-12-21 NOTE — Progress Notes (Signed)
Patient only stayed 20 min post infusion - aware recommended one hour post tysabri infusion. Patient informed to call MD with any problems or questions.

## 2016-01-15 ENCOUNTER — Telehealth: Payer: Self-pay | Admitting: Neurology

## 2016-01-15 ENCOUNTER — Encounter: Payer: Self-pay | Admitting: *Deleted

## 2016-01-15 ENCOUNTER — Telehealth: Payer: Self-pay | Admitting: *Deleted

## 2016-01-15 NOTE — Telephone Encounter (Signed)
[employer] [phone] called inquiring if tysabri re-authorization questionair was rec'd. She said the form was faxed 7/28 and then again 8/11.

## 2016-01-15 NOTE — Telephone Encounter (Signed)
Spoke to patient - she has been scheduled a follow up appt w/ Dr. Terrace ArabiaYan.  Her research program has ended.

## 2016-01-15 NOTE — Telephone Encounter (Signed)
Paperwork completed and faxed back today.   

## 2016-01-19 ENCOUNTER — Encounter (HOSPITAL_COMMUNITY)
Admission: RE | Admit: 2016-01-19 | Discharge: 2016-01-19 | Disposition: A | Discharge status: Home / Self Care | Payer: Medicaid Other | Source: Ambulatory Visit | Attending: Neurology | Admitting: Neurology

## 2016-01-19 ENCOUNTER — Encounter (HOSPITAL_COMMUNITY): Payer: Self-pay

## 2016-01-19 DIAGNOSIS — G35 Multiple sclerosis: Secondary | ICD-10-CM | POA: Diagnosis present

## 2016-01-19 MED ORDER — ACETAMINOPHEN 500 MG PO TABS
1000.0000 mg | ORAL_TABLET | ORAL | Status: AC
  Administered 2016-01-19: 1000 mg via ORAL
  Filled 2016-01-19: qty 2

## 2016-01-19 MED ORDER — SODIUM CHLORIDE 0.9 % IV SOLN
INTRAVENOUS | Status: AC
  Administered 2016-01-19: 09:00:00 via INTRAVENOUS

## 2016-01-19 MED ORDER — SODIUM CHLORIDE 0.9 % IV SOLN
300.0000 mg | INTRAVENOUS | Status: AC
  Administered 2016-01-19: 300 mg via INTRAVENOUS
  Filled 2016-01-19: qty 15

## 2016-01-19 MED ORDER — LORATADINE 10 MG PO TABS
10.0000 mg | ORAL_TABLET | ORAL | Status: AC
  Administered 2016-01-19: 10 mg via ORAL
  Filled 2016-01-19: qty 1

## 2016-01-19 NOTE — Progress Notes (Signed)
Uneventful infusion of Tysabri. Pt stayed only 20 minutes of the suggested post infusion observation by [facility]. She will contact her neurologist for any questions or concerns

## 2016-01-22 ENCOUNTER — Ambulatory Visit (INDEPENDENT_AMBULATORY_CARE_PROVIDER_SITE_OTHER): Payer: Medicaid Other | Admitting: Neurology

## 2016-01-22 ENCOUNTER — Encounter: Payer: Self-pay | Admitting: Neurology

## 2016-01-22 VITALS — BP 133/92 | HR 71 | Ht 64.0 in | Wt 197.8 lb

## 2016-01-22 DIAGNOSIS — I10 Essential (primary) hypertension: Secondary | ICD-10-CM

## 2016-01-22 DIAGNOSIS — R269 Unspecified abnormalities of gait and mobility: Secondary | ICD-10-CM

## 2016-01-22 DIAGNOSIS — G35 Multiple sclerosis: Secondary | ICD-10-CM

## 2016-01-22 MED ORDER — BACLOFEN 10 MG PO TABS: ORAL_TABLET | ORAL | 4 refills | Status: DC

## 2016-01-22 NOTE — Progress Notes (Signed)
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  HISTORY:She has history of hypertension, in May 18, 2010,while walking towards her car at the end of her working day, her legs give out underneath her, she fell face down on the concrete pavement at her Parking [place] of [employer], knocked out her front teeth.    MRI of the brain,in May 19, 2010 without contrast, demonstrated diffuse white matter changes throughout the supratentorial and infratentorial region, with corpus callosum involvement, MRI scan of the cervical spine shows demyelinating enhancing plaques at C 3 on right and C 4-5 on left. consistent with MS   Repeat MRI scan of the brain in 2012, showed multiple brainstem, cerebellar, subcortical, corpus callosal and periventricular white matter lesions typical for demyelinating disease. Several T 1 black holes and enhancing lesions are seen bilaterally suggesting chronic and active disease. Significant progression and worsening compared with noncontrast MRI 05/19/10.  CSF showed elevated Ig G index, 9 ocb, extensive lab test for mimic were negative, including HIV, RPR, b12 b6, ACE lyme SSA, B, CBC, CMP, PEP, ana, copper, mild ele crp 6.5, and very low vit D<4.   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 In 05/17/13,  index value was 1.17. Positive with titer of 1.6 9 in June 07 2014  UPDATE Jan 5th 2016: She was on long term disability for 2-3 years, now she is in the process of applying for  social disability, waiting for the final decision. She still has mild gait difficulty, muscle spasm, urinary urgency, occasional bowel urgency.  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 she started baclofen 10 mg 3 times a day since January fifth 2016, mild drowsiness, does help her lower extremity spasticity.  UPDATE March 22nd 2016: We have reviewed MRI of brain Feb 2016, stable, but moderate burden of round and ovoid, periventricular, subcortical, pericallosal, juxtacortical, midbrain, pontine, cerebellar and cervicomedullary junction chronic demyelinating plaques. Many of these are hypointense on T1 views. No abnormal lesions are seen on post contrast views. Compared to MRI on 12/02/13, no significant change.  She continues to have occasional falling episode, mild unsteady gait.  UPDATE May 19th 2016: [facility], She has Mirena IUD [facility]th [facility], also using condom contraceptives since 2011, I have emphasized the importance of contraception, she voiced understanding  UPDATE January 22 2016: She is here with her mother and daughter at today's clinical visit, she has slow worsening gait abnormality, dragging her right foot more, also noticed more clumsiness at her right hand since Nov 2016,   She is still taking baclofen 10 mg twice a day, she is receiving Tysarbri infusion every month at [facility], most recent infusion was January 19 2016, she tolerated the infusion well, no significant side effect noticed. She  is still finding her disability,  Last MRI of the brain was October 2016,   REVIEW OF SYSTEMS: Full 14 system review of systems performed and notable only for those listed, all others are neg: Gait difficulty,  falling,  ALLERGIES: No Known Allergies  HOME MEDICATIONS: Outpatient Medications Prior to Visit  Medication Sig Dispense Refill  . atenolol (TENORMIN) 50 MG tablet Take 50 mg by mouth daily.      . baclofen (LIORESAL) 10 MG tablet One tab po tid 90 each 6  . calcium citrate (CALCITRATE - DOSED IN MG ELEMENTAL CALCIUM) 950 MG tablet Take 1 tablet by mouth daily.      Marland Kitchen lisinopril (PRINIVIL,ZESTRIL) 10 MG tablet Take 2 tablets (20 mg total) by mouth daily. 60 tablet 6  . natalizumab (TYSABRI) 300 MG/15ML injection Inject into the vein every 30 (thirty) days.     No facility-administered medications prior to visit.     PAST MEDICAL HISTORY: Past Medical History:  Diagnosis Date  . Abnormality of gait   . Hypertension   . MS (multiple sclerosis) (HCC)   . Other nonspecific abnormal result of function study of brain and central nervous system   . Personal history of fall     PAST SURGICAL HISTORY: Past Surgical History:  Procedure Laterality Date  . CHOLECYSTECTOMY    . GALLBLADDER SURGERY      FAMILY HISTORY: Family History  Problem Relation Age of Onset  . Cancer Maternal Grandmother   . High blood pressure      Runs in both sides of the family    SOCIAL HISTORY: Social History   Social History  . Marital status: Single    Spouse name: N/A  . Number of children: 2  . Years of education: 12   Occupational History  . FILE CLERK [employer]   Social History Main Topics  . Smoking status: Never Smoker  . Smokeless tobacco: Never Used  . Alcohol use No  . Drug use: No  . Sexual activity: Not on file   Other Topics Concern  . Not on file   Social History Narrative   Patient has a high school education and two children. Patient is not currently working.   Patient is right-handed.   Patient drinks three cups of tea daily and two cans of soda daily.     PHYSICAL EXAM  Vitals:   01/22/16 1311  BP: (!) 133/92  Pulse: 71  Weight: 197 lb 12 oz (89.7  kg)  Height: 5\' 4"  (1.626 m)   Body mass index is 33.94 kg/m. PHYSICAL EXAMNIATION:  Gen: NAD, conversant, well nourised, obese, well groomed                     Cardiovascular: Regular rate rhythm, no peripheral edema, warm, nontender. Eyes: Conjunctivae clear without exudates or hemorrhage Neck: Supple, no carotid bruise. Pulmonary: Clear to auscultation bilaterally   NEUROLOGICAL EXAM:  MENTAL STATUS: Speech:    Speech is normal; fluent and spontaneous with normal comprehension.  Cognition:    The patient is oriented to person, place, and time;     recent and remote memory intact;     language fluent;     normal attention, concentration,     fund of knowledge.  CRANIAL NERVES: CN II: Visual fields are full to confrontation. Fundoscopic exam is normal with sharp discs and no vascular changes. Venous pulsations are present bilaterally. Pupils are 4 mm and briskly reactive to light. Visual  acuity is 20/20 bilaterally. CN III, IV, VI: extraocular movement are normal. No ptosis. CN V: Facial sensation is intact to pinprick in all 3 divisions bilaterally. Corneal responses are intact.  CN VII: Mild left lower face weakness CN VIII: Hearing is normal to rubbing fingers CN IX, X: Palate elevates symmetrically. Phonation is normal. CN XI: Head turning and shoulder shrug are intact CN XII: Tongue is midline with normal movements and no atrophy.  MOTOR: She has moderate bilateral lower extremity spasticity, mild right arm proximal and right leg proximal muscle weakness. Fixation of right arm on rapid rotating movement  REFLEXES: Reflexes are 2+ and symmetric at the biceps, triceps, 3/4 knees, and ankles. Plantar responses are flexor.  SENSORY: Light touch, pinprick, position sense, and vibration sense are intact in fingers and toes.  COORDINATION: Rapid alternating movements and fine finger movements are intact. There is no dysmetria on finger-to-nose and heel-knee-shin. There  are no abnormal or extraneous movements.   GAIT/STANCE: Wide based, cautious, dragging her right leg, Romberg is absent.   DIAGNOSTIC DATA (LABS, IMAGING, TESTING) - I reviewed patient records, labs, notes, testing and imaging myself where available.  Lab Results  Component Value Date   WBC 12.4 (H) 06/07/2014   HGB 13.0 06/07/2014   HCT 38.1 06/07/2014   MCV 84 06/07/2014   PLT 418 (H) 06/07/2014      Component Value Date/Time   NA 136 06/07/2014 1255   K 4.8 06/07/2014 1255   CL 94 (L) 06/07/2014 1255   CO2 19 06/07/2014 1255   GLUCOSE 118 (H) 06/07/2014 1255   GLUCOSE 84 05/18/2010 1900   BUN 3 (L) 06/07/2014 1255   CREATININE 0.33 (L) 06/07/2014 1255   CALCIUM 8.1 (L) 06/07/2014 1255   PROT 5.6 (L) 06/07/2014 1255   ALBUMIN 3.3 (L) 06/07/2014 1255   AST 10 06/07/2014 1255   ALT 7 06/07/2014 1255   ALKPHOS 85 06/07/2014 1255   BILITOT 0.6 06/07/2014 1255   GFRNONAA 144 06/07/2014 1255   GFRAA 166 06/07/2014 1255   Last MRI of brain with and without contrast October 2016: Stable moderate burden of round and ovoid periventricular subcortical, pericallosal, justacortical, midbrain, pontine, cerebellum, cervical medullary junction chronic demyelinating plaque. JC virus January 2016: Positive with titer of 1.69    ASSESSMENT AND PLAN  35 y.o. year old female   Relapsing remitting multiple sclerosis Slow worsening gait abnormality Bilateral lower extremity spasticity  Repeat MRI of the brain/cervical with and without contrast  Laboratory evaluations including JC virus antibody, anti-Tysarbri antibody  Return to clinic in one month  Baclofen 10 mg 3 times a day   Levert FeinsteinYijun Pollyann Roa, M.D. Ph.D.  [facility] [address] [place], [place] [address] Phone: [phone] Fax:      [phone]

## 2016-01-24 ENCOUNTER — Encounter: Payer: Self-pay | Admitting: *Deleted

## 2016-01-24 ENCOUNTER — Other Ambulatory Visit: Payer: Self-pay | Admitting: Neurology

## 2016-01-26 LAB — VITAMIN D 25 HYDROXY (VIT D DEFICIENCY, FRACTURES): VIT D 25 HYDROXY: 9.4 ng/mL — AB (ref 30.0–100.0)

## 2016-01-26 LAB — COMPREHENSIVE METABOLIC PANEL
A/G RATIO: 1.4 (ref 1.2–2.2)
ALK PHOS: 92 IU/L (ref 39–117)
ALT: 10 IU/L (ref 0–32)
AST: 11 IU/L (ref 0–40)
Albumin: 4.2 g/dL (ref 3.5–5.5)
BILIRUBIN TOTAL: 0.4 mg/dL (ref 0.0–1.2)
BUN/Creatinine Ratio: 11 (ref 9–23)
BUN: 10 mg/dL (ref 6–20)
CHLORIDE: 101 mmol/L (ref 96–106)
CO2: 24 mmol/L (ref 18–29)
Calcium: 9.2 mg/dL (ref 8.7–10.2)
Creatinine, Ser: 0.88 mg/dL (ref 0.57–1.00)
GFR calc non Af Amer: 84 mL/min/{1.73_m2} (ref >59)
GFR, EST AFRICAN AMERICAN: 97 mL/min/{1.73_m2} (ref >59)
GLUCOSE: 94 mg/dL (ref 65–99)
Globulin, Total: 3.1 g/dL (ref 1.5–4.5)
POTASSIUM: 4.5 mmol/L (ref 3.5–5.2)
Sodium: 139 mmol/L (ref 134–144)
TOTAL PROTEIN: 7.3 g/dL (ref 6.0–8.5)

## 2016-01-26 LAB — CBC
Hematocrit: 35.6 % (ref 34.0–46.6)
Hemoglobin: 11.6 g/dL (ref 11.1–15.9)
MCH: 27.2 pg (ref 26.6–33.0)
MCHC: 32.6 g/dL (ref 31.5–35.7)
MCV: 84 fL (ref 79–97)
PLATELETS: 378 10*3/uL (ref 150–379)
RBC: 4.26 x10E6/uL (ref 3.77–5.28)
RDW: 15 % (ref 12.3–15.4)
WBC: 10.2 10*3/uL (ref 3.4–10.8)

## 2016-01-26 LAB — ANTI-TYSABRI (NATALIZUMAB) AB: ANTI-TYSABRI(NATALIZUMAB) AB: NEGATIVE

## 2016-01-26 LAB — TSH: TSH: 1.12 u[IU]/mL (ref 0.450–4.500)

## 2016-01-31 ENCOUNTER — Telehealth: Payer: Self-pay | Admitting: *Deleted

## 2016-01-31 NOTE — Telephone Encounter (Signed)
Duplicate encounter opened in error.

## 2016-01-31 NOTE — Telephone Encounter (Signed)
Labs collected 01/23/16 - JCV 1.50 positive

## 2016-02-04 ENCOUNTER — Ambulatory Visit
Admission: RE | Admit: 2016-02-04 | Discharge: 2016-02-04 | Disposition: A | Discharge status: Home / Self Care | Payer: Medicaid Other | Source: Ambulatory Visit | Attending: Neurology | Admitting: Neurology

## 2016-02-04 DIAGNOSIS — G35 Multiple sclerosis: Secondary | ICD-10-CM

## 2016-02-04 MED ORDER — GADOBENATE DIMEGLUMINE 529 MG/ML IV SOLN
15.0000 mL | Freq: Once | INTRAVENOUS | Status: AC | PRN
  Administered 2016-02-04: 15 mL via INTRAVENOUS

## 2016-02-06 ENCOUNTER — Telehealth: Payer: Self-pay | Admitting: Neurology

## 2016-02-06 NOTE — Telephone Encounter (Signed)
Spoke to patient - she is still having gait difficulty.  Per Dr. Terrace ArabiaYan, she should have Solu-Medrol 1 gram x 3 days.  Patient is agreeable to this treatment.  [facility] [facility] booked for the week).  The address is [address]  Appts: 9/6 at 10am, 9/7 at 9am, 9/8 at 9am.  Pt aware of times and location.  She is aware to expect a call from our research department.

## 2016-02-06 NOTE — Telephone Encounter (Signed)
I have called her left message to her mother. MRI of the brain and cervical spine continue showed evidence of chronic demyelinating lesions, no enhancement, no significant change compared to previous scans.  She has been on [place]ysarbri treatment since 2012, positive JC virus with titer of 1.69. I also mentioned about multiple sclerosis rouse, she is advised to contact our office, my research coordinator will also contact her for more information.   MRI of the cervical spine showed evidence of chronic demyelinating plaque at cervical region, scattered at C 1 2, 3, C4-5, C5-6 level.  IMPRESSION:  This MRI of the cervical spine with and without contrast shows the following: 1.    Multiple T2 hyperintense foci within the spinal cord adjacent to C1, C2, C3, C4-C5 and C5-C6 as detailed above consistent with chronic demyelinating plaque associated with multiple sclerosis. None of these foci enhanced after gadolinium contrast. 2.    There are no significant degenerative changes and there is no nerve root impingement. 3.    There is a normal enhancement pattern and there are no acute findings.   [phone].

## 2016-02-06 NOTE — Telephone Encounter (Addendum)
Spoke to patient - she is still having gait difficulty.  Per Dr. Terrace Arabia, she should have Solu-Medrol 1 gram x 3 days.  Patient is agreeable to this treatment.  She has been scheduled at the [facility] [phone]) for her infusion [facility] Short Stay booked for the week).  The address is [address].  Appts: 9/6 at 10am, 9/7 at 9am, 9/8 at 9am.  Pt aware of times and location.

## 2016-02-06 NOTE — Telephone Encounter (Signed)
Please see previous phone note, also call check on patient to see if her gait difficulty has recovered some, if not, we may consider steroid treatment.

## 2016-02-06 NOTE — Telephone Encounter (Signed)
Patient is returning your call. She can be reached at [phone].

## 2016-02-07 ENCOUNTER — Ambulatory Visit (HOSPITAL_COMMUNITY)
Admission: RE | Admit: 2016-02-07 | Discharge: 2016-02-07 | Disposition: A | Discharge status: Home / Self Care | Payer: Medicaid Other | Source: Ambulatory Visit | Attending: Obstetrics and Gynecology | Admitting: Obstetrics and Gynecology

## 2016-02-07 DIAGNOSIS — G35 Multiple sclerosis: Secondary | ICD-10-CM | POA: Diagnosis present

## 2016-02-07 MED ORDER — SODIUM CHLORIDE 0.9 % IV SOLN
1000.0000 mg | Freq: Every day | INTRAVENOUS | Status: DC
  Administered 2016-02-07: 1000 mg via INTRAVENOUS
  Filled 2016-02-07: qty 8

## 2016-02-07 NOTE — Progress Notes (Signed)
Provider: Levert Feinstein MD  Associated Diagnosis: Multiple Sclerosis  Procedure: Infusion of Solu-Medrol 1 gram IV... 1 st day of daily infusion for three days. Via PIV.  Tolerated infusion well. No reaction. Educated patient on side effects. Went over discharge instructions and copy given to patient. Alert, oriented and ambulatory at time of discharge.

## 2016-02-07 NOTE — Discharge Instructions (Signed)
Solumedrol 1 gram IV first day.

## 2016-02-08 ENCOUNTER — Ambulatory Visit (HOSPITAL_COMMUNITY)
Admission: RE | Admit: 2016-02-08 | Discharge: 2016-02-08 | Disposition: A | Discharge status: Home / Self Care | Payer: Medicaid Other | Source: Ambulatory Visit | Attending: Obstetrics and Gynecology | Admitting: Obstetrics and Gynecology

## 2016-02-08 DIAGNOSIS — G35 Multiple sclerosis: Secondary | ICD-10-CM | POA: Diagnosis not present

## 2016-02-08 MED ORDER — SODIUM CHLORIDE 0.9 % IV SOLN
1000.0000 mg | Freq: Every day | INTRAVENOUS | Status: DC
  Administered 2016-02-08: 1000 mg via INTRAVENOUS
  Filled 2016-02-08: qty 8

## 2016-02-08 NOTE — Discharge Instructions (Signed)
Methylprednisolone Solution for Injection  What is this medicine?  METHYLPREDNISOLONE (meth ill pred NISS oh lone) is a corticosteroid. It is commonly used to treat inflammation of the skin, joints, lungs, and other organs. Common conditions treated include asthma, allergies, and arthritis. It is also used for other conditions, such as blood disorders and diseases of the adrenal glands.  This medicine may be used for other purposes; ask your health care provider or pharmacist if you have questions.  What should I tell my health care provider before I take this medicine?  They need to know if you have any of these conditions:  -cataracts or glaucoma  -Cushing's syndrome  -heart disease  -high blood pressure  -infection including tuberculosis  -low calcium or potassium levels in the blood  -recent surgery  -seizures  -stomach or intestinal disease, including colitis  -threadworms  -thyroid problems  -an unusual or allergic reaction to methylprednisolone, corticosteroids, benzyl alcohol, other medicines, foods, dyes, or preservatives  -pregnant or trying to get pregnant  -breast-feeding  How should I use this medicine?  This medicine is for injection or infusion into a vein. It is also for injection into a muscle. It is given by a health care professional in a hospital or clinic setting.  Talk to your pediatrician regarding the use of this medicine in children. While this drug may be prescribed for selected conditions, precautions do apply.  Overdosage: If you think you have taken too much of this medicine contact a poison control center or emergency room at once.  NOTE: This medicine is only for you. Do not share this medicine with others.  What if I miss a dose?  This does not apply.  What may interact with this medicine?  Do not take this medicine with any of the following medications:  -mifepristone  This medicine may also interact with the following medications:  -aspirin and aspirin-like  medicines  -cyclosporin  -ketoconazole  -phenobarbital  -phenytoin  -rifampin  -tacrolimus  -troleandomycin  -vaccines  -warfarin  This list may not describe all possible interactions. Give your health care provider a list of all the medicines, herbs, non-prescription drugs, or dietary supplements you use. Also tell them if you smoke, drink alcohol, or use illegal drugs. Some items may interact with your medicine.  What should I watch for while using this medicine?  Visit your doctor or health care professional for regular checks on your progress. If you are taking this medicine for a long time, carry an identification card with your name and address, the type and dose of your medicine, and your doctor's name and address.  The medicine may increase your risk of getting an infection. Stay away from people who are sick. Tell your doctor or health care professional if you are around anyone with measles or chickenpox.  You may need to avoid some vaccines. Talk to your health care provider for more information.  If you are going to have surgery, tell your doctor or health care professional that you have taken this medicine within the last twelve months.  Ask your doctor or health care professional about your diet. You may need to lower the amount of salt you eat.  The medicine can increase your blood sugar. If you are a diabetic check with your doctor if you need help adjusting the dose of your diabetic medicine.  What side effects may I notice from receiving this medicine?  Side effects that you should report to your doctor or health care   professional as soon as possible:  -allergic reactions like skin rash, itching or hives, swelling of the face, lips, or tongue  -bloody or tarry stools  -changes in vision  -eye pain or bulging eyes  -fever, sore throat, sneezing, cough, or other signs of infection, wounds that will not heal  -increased thirst  -irregular heartbeat  -muscle cramps  -pain in hips, back, ribs, arms,  shoulders, or legs  -swelling of the ankles, feet, hands  -trouble passing urine or change in the amount of urine  -unusual bleeding or bruising  -unusually weak or tired  -weight gain or weight loss  Side effects that usually do not require medical attention (report to your doctor or health care professional if they continue or are bothersome):  -changes in emotions or moods  -constipation or diarrhea  -headache  -irritation at site where injected  -nausea, vomiting  -skin problems, acne, thin and shiny skin  -trouble sleeping  -unusual hair growth on the face or body  This list may not describe all possible side effects. Call your doctor for medical advice about side effects. You may report side effects to FDA at 1-800-FDA-1088.  Where should I keep my medicine?  This drug is given in a hospital or clinic and will not be stored at home.  NOTE: This sheet is a summary. It may not cover all possible information. If you have questions about this medicine, talk to your doctor, pharmacist, or health care provider.      2016, Elsevier/Gold Standard. (2012-02-18 11:37:16)

## 2016-02-08 NOTE — Procedures (Signed)
[facility]  Procedure Note  Julie Pope CLE:[id number] DOB: 04/02/1981 DOA: 02/08/2016   Provider: Levert Feinstein MD  Associated Diagnosis: Multiple Sclerosis  Procedure: Infusion of Solu-Medrol 1 gram IV... 2nd  day of daily infusion for three days. Via PIV.  Tolerated infusion well. No reaction. Educated patient on side effects. Went over discharge instructions and copy given to patient. Alert, oriented and ambulatory at time of discharge  TATUM, Geran Haithcock, RN  [facility]

## 2016-02-09 ENCOUNTER — Ambulatory Visit (HOSPITAL_COMMUNITY)
Admission: RE | Admit: 2016-02-09 | Discharge: 2016-02-09 | Disposition: A | Discharge status: Home / Self Care | Payer: Medicaid Other | Source: Ambulatory Visit | Attending: Obstetrics and Gynecology | Admitting: Obstetrics and Gynecology

## 2016-02-09 DIAGNOSIS — G35 Multiple sclerosis: Secondary | ICD-10-CM | POA: Diagnosis not present

## 2016-02-09 MED ORDER — SODIUM CHLORIDE 0.9 % IV SOLN
1000.0000 mg | Freq: Once | INTRAVENOUS | Status: AC
  Administered 2016-02-09: 1000 mg via INTRAVENOUS
  Filled 2016-02-09: qty 8

## 2016-02-09 NOTE — Progress Notes (Signed)
Provider: Levert FeinsteinYijun Yan MD  Associated Diagnosis: Multiple Sclerosis  Procedure: Infusion of Solu-Medrol 1 gram IV.Marland Kitchen.Marland Kitchen.3rd day of daily infusion for three days. Via PIV.  Tolerated infusion well. No reaction. Educated patient on side effects. Went over discharge instructions and copy given to patient. Alert, oriented and ambulatory at time of discharge

## 2016-02-09 NOTE — Discharge Instructions (Signed)
Pt received 1 gram of Solu Medrol today.

## 2016-02-13 ENCOUNTER — Other Ambulatory Visit: Payer: Self-pay | Admitting: Neurology

## 2016-02-14 ENCOUNTER — Other Ambulatory Visit: Payer: Self-pay | Admitting: Neurology

## 2016-02-14 DIAGNOSIS — G35 Multiple sclerosis: Secondary | ICD-10-CM

## 2016-02-16 ENCOUNTER — Encounter (HOSPITAL_COMMUNITY): Payer: Self-pay

## 2016-02-16 ENCOUNTER — Encounter (HOSPITAL_COMMUNITY)
Admission: RE | Admit: 2016-02-16 | Discharge: 2016-02-16 | Disposition: A | Discharge status: Home / Self Care | Payer: Medicaid Other | Source: Ambulatory Visit | Attending: Neurology | Admitting: Neurology

## 2016-02-16 DIAGNOSIS — G35 Multiple sclerosis: Secondary | ICD-10-CM | POA: Insufficient documentation

## 2016-02-16 MED ORDER — SODIUM CHLORIDE 0.9 % IV SOLN
INTRAVENOUS | Status: DC
  Administered 2016-02-16: 09:00:00 via INTRAVENOUS

## 2016-02-16 MED ORDER — ACETAMINOPHEN 500 MG PO TABS
1000.0000 mg | ORAL_TABLET | ORAL | Status: DC
  Administered 2016-02-16: 1000 mg via ORAL
  Filled 2016-02-16: qty 2

## 2016-02-16 MED ORDER — LORATADINE 10 MG PO TABS
10.0000 mg | ORAL_TABLET | ORAL | Status: DC
  Administered 2016-02-16: 10 mg via ORAL
  Filled 2016-02-16: qty 1

## 2016-02-16 MED ORDER — SODIUM CHLORIDE 0.9 % IV SOLN
300.0000 mg | INTRAVENOUS | Status: DC
  Administered 2016-02-16: 300 mg via INTRAVENOUS
  Filled 2016-02-16: qty 15

## 2016-02-16 NOTE — Discharge Instructions (Signed)

## 2016-03-13 ENCOUNTER — Encounter: Payer: Self-pay | Admitting: Neurology

## 2016-03-13 ENCOUNTER — Ambulatory Visit (INDEPENDENT_AMBULATORY_CARE_PROVIDER_SITE_OTHER): Payer: Medicaid Other | Admitting: Neurology

## 2016-03-13 ENCOUNTER — Telehealth: Payer: Self-pay | Admitting: *Deleted

## 2016-03-13 VITALS — BP 150/91 | HR 71 | Ht 64.0 in | Wt 202.5 lb

## 2016-03-13 DIAGNOSIS — G35 Multiple sclerosis: Secondary | ICD-10-CM

## 2016-03-13 DIAGNOSIS — Z9181 History of falling: Secondary | ICD-10-CM | POA: Diagnosis not present

## 2016-03-13 NOTE — Telephone Encounter (Addendum)
Arrived 17 minutes late to her appt.  Dr. Terrace ArabiaYan was able to still see her today.

## 2016-03-13 NOTE — Progress Notes (Signed)
[facility]  PATIENT: Julie MatesShanda D Fruin DOB: 1980/06/07   REASON FOR VISIT: follow up for relapsing remitting MS   HISTORY OF PRESENT ILLNESS: Ms. 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  HISTORY:She has history of hypertension, in May 18, 2010,while walking towards her car at the end of her working day, her legs give out underneath her, she fell face down on the concrete pavement at her [place]ing [place] of [employer], knocked out her front teeth.    MRI of the brain,in May 19, 2010 without contrast, demonstrated diffuse white matter changes throughout the supratentorial and infratentorial region, with corpus callosum involvement, MRI scan of the cervical spine shows demyelinating enhancing plaques at C 3 on right and C 4-5 on left. consistent with MS   Repeat MRI scan of the brain in 2012, showed multiple brainstem, cerebellar, subcortical, corpus callosal and periventricular white matter lesions typical for demyelinating disease. Several T 1 black holes and enhancing lesions are seen bilaterally suggesting chronic and active disease. Significant progression and worsening compared with noncontrast MRI 05/19/10.  CSF showed elevated Ig G index, 9 ocb, extensive lab test for mimic were negative, including HIV, RPR, b12 b6, ACE lyme SSA, B, CBC, CMP, PEP, ana, copper, mild ele crp 6.5, and very low vit D<4.   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 In 05/17/13,  index value was 1.17. Positive with titer of 1.6 9 in June 07 2014  UPDATE Jan 5th 2016: She was on long term disability for 2-3 years, now she is in the process of applying for  social disability, waiting for the final decision. She still has mild gait difficulty, muscle spasm, urinary urgency, occasional bowel urgency.  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 she started baclofen 10 mg 3 times a day since January fifth 2016, mild drowsiness, does help her lower extremity spasticity.  UPDATE March 22nd 2016: We have reviewed MRI of brain Feb 2016, stable, but moderate burden of round and ovoid, periventricular, subcortical, pericallosal, juxtacortical, midbrain, pontine, cerebellar and cervicomedullary junction chronic demyelinating plaques. Many of these are hypointense on T1 views. No abnormal lesions are seen on post contrast views. Compared to MRI on 12/02/13, no significant change.  She continues to have occasional falling episode, mild unsteady gait.  UPDATE May 19th 2016: [facility], She has Mirena IUD [facility]th [facility], also using condom contraceptives since 2011, I have emphasized the importance of contraception, she voiced understanding  UPDATE January 22 2016: She is here with her mother and daughter at today's clinical visit, she has slow worsening gait abnormality, dragging her right foot more, also noticed more clumsiness at her right hand since Nov 2016,   She is still taking baclofen 10 mg twice a day, she is receiving Tysarbri infusion every month at [facility] short stay, most recent infusion was January 19 2016, she tolerated the infusion well, no significant side effect noticed. She  is still finding her disability,  UPDATE Oct 11th 2017: Laboratory evaluation showed negative anti-Tysarbri antibody,normal CMP, CBC, significantly low vitamin D level 9.4, normal CBC.  She has received IV Solu-Medrol 3  days in September 2017 without significant improvement, We have personally reviewed and compared to MRI scans, most recent September 2017, multiple T2/FLAIR hyperintensity foci within the brain stem, cerebellum, middle cerebellar peduncle, spinal cord periventricular, chest of cortical and deep white matter, no contrast enhancement, no significant change compared to 2016.  She complains of worsening gait abnormality, increased right arm leg weakness REVIEW OF SYSTEMS: Full 14 system review of systems performed and notable only for those listed, all others are neg: Gait difficulty, falling,  ALLERGIES: No Known Allergies  HOME MEDICATIONS: Outpatient Medications Prior to Visit  Medication Sig Dispense Refill  . atenolol (TENORMIN) 50 MG tablet Take 50 mg by mouth daily.      . baclofen (LIORESAL) 10 MG tablet One tab po tid 270 each 4  . calcium citrate (CALCITRATE - DOSED IN MG ELEMENTAL CALCIUM) 950 MG tablet Take 1 tablet by mouth daily.      . Cholecalciferol (VITAMIN D3) 1000 units CAPS Take by mouth daily.    Marland Kitchen lisinopril (PRINIVIL,ZESTRIL) 10 MG tablet Take 2 tablets (20 mg total) by mouth daily. 60 tablet 6  . natalizumab (TYSABRI) 300 MG/15ML injection Inject into the vein every 30 (thirty) days.     No facility-administered medications prior to visit.     PAST MEDICAL HISTORY: Past Medical History:  Diagnosis Date  . Abnormality of gait   . Hypertension   . MS (multiple sclerosis) (HCC)   . Other nonspecific abnormal result of function study of brain and central nervous system   . Personal history of fall     PAST SURGICAL HISTORY: Past Surgical History:  Procedure Laterality Date  . CHOLECYSTECTOMY    . GALLBLADDER SURGERY      FAMILY HISTORY: Family History  Problem Relation Age of Onset  . Cancer Maternal Grandmother   . High blood pressure      Runs in both sides of the family    SOCIAL HISTORY: Social History   Social History  . Marital status: Single      Spouse name: N/A  . Number of children: 2  . Years of education: 12   Occupational History  . FILE CLERK [employer]   Social History Main Topics  . Smoking status: Never Smoker  . Smokeless tobacco: Never Used  . Alcohol use No  . Drug use: No  . Sexual activity: Not on file   Other Topics Concern  . Not on file   Social History Narrative   Patient has a high school education and two children. Patient is not currently working.   Patient is right-handed.   Patient drinks three cups of tea daily and two cans of soda daily.     PHYSICAL EXAM  Vitals:   03/13/16 0958  BP: (!) 150/91  Pulse: 71  Weight: 202 lb 8 oz (91.9 kg)  Height: 5\' 4"  (1.626 m)   Body mass index is 34.76 kg/m. PHYSICAL EXAMNIATION:  Gen: NAD, conversant, well nourised, obese, well groomed                     Cardiovascular: Regular rate rhythm, no peripheral edema, warm, nontender. Eyes: Conjunctivae clear without exudates or hemorrhage Neck: Supple, no carotid bruise. Pulmonary: Clear to auscultation bilaterally   NEUROLOGICAL EXAM:  MENTAL STATUS: Speech:    Speech is normal; fluent and spontaneous with normal comprehension.  Cognition:    The patient is oriented to person, place, and time;     recent and remote memory intact;     language fluent;     normal attention, concentration,     fund of knowledge.  CRANIAL NERVES: CN II: Visual fields are full to confrontation. Fundoscopic exam is normal with sharp discs and no vascular changes. Venous pulsations are present bilaterally. Pupils are 4 mm and briskly reactive to light. Visual acuity is 20/20 bilaterally. CN III, IV, VI: extraocular movement are normal. No ptosis. CN V: Facial sensation is intact to pinprick in all 3 divisions bilaterally. Corneal responses are intact.  CN VII: Mild left lower face weakness CN VIII: Hearing is normal to rubbing fingers CN IX, X: Palate elevates symmetrically. Phonation is normal. CN XI:  Head turning and shoulder shrug are intact CN XII: Tongue is midline with normal movements and no atrophy.  MOTOR: She has moderate bilateral lower extremity spasticity, mild right arm proximal and right leg proximal muscle weakness. Fixation of right arm on rapid rotating movement  REFLEXES: Reflexes are 2+ and symmetric at the biceps, triceps, 3/4 knees, and ankles. Plantar responses are flexor.  SENSORY: Light touch, pinprick, position sense, and vibration sense are intact in fingers and toes.  COORDINATION: Rapid alternating movements and fine finger movements are intact. There is no dysmetria on finger-to-nose and heel-knee-shin. There are no abnormal or extraneous movements.   GAIT/STANCE: Wide based, cautious, dragging her right leg, Romberg is absent.   DIAGNOSTIC DATA (LABS, IMAGING, TESTING) - I reviewed patient records, labs, notes, testing and imaging myself where available.  Lab Results  Component Value Date   WBC 10.2 01/22/2016   HGB 13.0 06/07/2014   HCT 35.6 01/22/2016   MCV 84 01/22/2016   PLT 378 01/22/2016      Component Value Date/Time   NA 139 01/22/2016 1337   K 4.5 01/22/2016 1337   CL 101 01/22/2016 1337   CO2 24 01/22/2016 1337   GLUCOSE 94 01/22/2016 1337   GLUCOSE 84 05/18/2010 1900   BUN 10 01/22/2016 1337   CREATININE 0.88 01/22/2016 1337   CALCIUM 9.2 01/22/2016 1337   PROT 7.3 01/22/2016 1337   ALBUMIN 4.2 01/22/2016 1337   AST 11 01/22/2016 1337   ALT 10 01/22/2016 1337   ALKPHOS 92 01/22/2016 1337   BILITOT 0.4 01/22/2016 1337   GFRNONAA 84 01/22/2016 1337   GFRAA 97 01/22/2016 1337   Last MRI of brain with and without contrast October 2016: Stable moderate burden of round and ovoid periventricular subcortical, pericallosal, justacortical, midbrain, pontine, cerebellum, cervical medullary junction chronic demyelinating plaque. JC virus January 2016: Positive with titer of 1.69    ASSESSMENT AND PLAN  35 y.o. year old female     Relapsing remitting multiple sclerosis  Slow worsening gait abnormality, recent worsening right arm leg weakness  We have reviewed repeat MRI of the brain and cervical spine, there was no significant change, continued evidence of multiple MS plaques, no contrast enhancement.   She has no significant improvement with recent IV steroid treatment in September 2017    Bilateral lower extremity spasticity  Baclofen 10 mg 3 times a day    Levert Feinstein, M.D. Ph.D.  [facility] [address] [place], [place] [address] Phone: [phone] Fax:      [phone]

## 2016-03-19 ENCOUNTER — Encounter (HOSPITAL_COMMUNITY): Payer: Self-pay

## 2016-03-19 ENCOUNTER — Encounter (HOSPITAL_COMMUNITY)
Admission: RE | Admit: 2016-03-19 | Discharge: 2016-03-19 | Disposition: A | Discharge status: Home / Self Care | Payer: Medicaid Other | Source: Ambulatory Visit | Attending: Neurology | Admitting: Neurology

## 2016-03-19 DIAGNOSIS — G35 Multiple sclerosis: Secondary | ICD-10-CM

## 2016-03-19 MED ORDER — ACETAMINOPHEN 500 MG PO TABS
1000.0000 mg | ORAL_TABLET | ORAL | Status: DC
  Administered 2016-03-19: 1000 mg via ORAL
  Filled 2016-03-19: qty 2

## 2016-03-19 MED ORDER — SODIUM CHLORIDE 0.9 % IV SOLN
300.0000 mg | INTRAVENOUS | Status: DC
  Administered 2016-03-19: 300 mg via INTRAVENOUS
  Filled 2016-03-19: qty 15

## 2016-03-19 MED ORDER — LORATADINE 10 MG PO TABS
10.0000 mg | ORAL_TABLET | ORAL | Status: DC
  Administered 2016-03-19: 10 mg via ORAL
  Filled 2016-03-19: qty 1

## 2016-03-19 MED ORDER — SODIUM CHLORIDE 0.9 % IV SOLN
INTRAVENOUS | Status: DC
  Administered 2016-03-19: 11:00:00 via INTRAVENOUS

## 2016-03-19 NOTE — Progress Notes (Signed)
Uneventful Tysabri infusion.  Pt stayed 20 minute post infusion.  Pt knows to call MD with questions and concerns.

## 2016-04-16 ENCOUNTER — Encounter (HOSPITAL_COMMUNITY)
Admission: RE | Admit: 2016-04-16 | Discharge: 2016-04-16 | Disposition: A | Discharge status: Home / Self Care | Payer: Medicaid Other | Source: Ambulatory Visit | Attending: Neurology | Admitting: Neurology

## 2016-04-16 DIAGNOSIS — G35 Multiple sclerosis: Secondary | ICD-10-CM | POA: Insufficient documentation

## 2016-04-16 MED ORDER — ACETAMINOPHEN 500 MG PO TABS
1000.0000 mg | ORAL_TABLET | ORAL | Status: AC
  Administered 2016-04-16: 1000 mg via ORAL
  Filled 2016-04-16: qty 2

## 2016-04-16 MED ORDER — SODIUM CHLORIDE 0.9 % IV SOLN
300.0000 mg | INTRAVENOUS | Status: AC
  Administered 2016-04-16: 300 mg via INTRAVENOUS
  Filled 2016-04-16: qty 15

## 2016-04-16 MED ORDER — SODIUM CHLORIDE 0.9 % IV SOLN
INTRAVENOUS | Status: AC
  Administered 2016-04-16: 10:00:00 via INTRAVENOUS

## 2016-04-16 MED ORDER — LORATADINE 10 MG PO TABS
10.0000 mg | ORAL_TABLET | ORAL | Status: AC
  Administered 2016-04-16: 10 mg via ORAL
  Filled 2016-04-16: qty 1

## 2016-04-16 NOTE — Progress Notes (Signed)
Uneventful infusion of Tysabri. Pt stayed only 30 minutes of the TOUCH recommended 1 hour post infusion observation time. She will contact her neurologist for any questions or concerns

## 2016-05-14 ENCOUNTER — Encounter (HOSPITAL_COMMUNITY): Payer: Self-pay

## 2016-05-14 ENCOUNTER — Encounter (HOSPITAL_COMMUNITY)
Admission: RE | Admit: 2016-05-14 | Discharge: 2016-05-14 | Disposition: A | Discharge status: Home / Self Care | Payer: Medicaid Other | Source: Ambulatory Visit | Attending: Neurology | Admitting: Neurology

## 2016-05-14 DIAGNOSIS — G35 Multiple sclerosis: Secondary | ICD-10-CM | POA: Diagnosis not present

## 2016-05-14 MED ORDER — ACETAMINOPHEN 500 MG PO TABS
1000.0000 mg | ORAL_TABLET | ORAL | Status: AC
  Administered 2016-05-14: 1000 mg via ORAL
  Filled 2016-05-14: qty 2

## 2016-05-14 MED ORDER — SODIUM CHLORIDE 0.9 % IV SOLN
300.0000 mg | INTRAVENOUS | Status: AC
  Administered 2016-05-14: 300 mg via INTRAVENOUS
  Filled 2016-05-14: qty 15

## 2016-05-14 MED ORDER — LORATADINE 10 MG PO TABS
10.0000 mg | ORAL_TABLET | ORAL | Status: AC
  Administered 2016-05-14: 10 mg via ORAL
  Filled 2016-05-14: qty 1

## 2016-05-14 MED ORDER — SODIUM CHLORIDE 0.9 % IV SOLN
INTRAVENOUS | Status: AC
  Administered 2016-05-14: 10:00:00 via INTRAVENOUS

## 2016-05-29 ENCOUNTER — Other Ambulatory Visit: Payer: Self-pay | Admitting: Neurology

## 2016-05-29 DIAGNOSIS — G35 Multiple sclerosis: Secondary | ICD-10-CM

## 2016-05-29 NOTE — Progress Notes (Signed)
Enter the order for Tysabri infusion with 5 refills,

## 2016-06-11 ENCOUNTER — Encounter (HOSPITAL_COMMUNITY)
Admission: RE | Admit: 2016-06-11 | Discharge: 2016-06-11 | Disposition: A | Discharge status: Home / Self Care | Payer: Medicaid Other | Source: Ambulatory Visit | Attending: Neurology | Admitting: Neurology

## 2016-06-11 ENCOUNTER — Encounter (HOSPITAL_COMMUNITY): Payer: Self-pay

## 2016-06-11 DIAGNOSIS — G35 Multiple sclerosis: Secondary | ICD-10-CM | POA: Diagnosis present

## 2016-06-11 MED ORDER — LORATADINE 10 MG PO TABS
10.0000 mg | ORAL_TABLET | ORAL | Status: DC
  Administered 2016-06-11: 10 mg via ORAL
  Filled 2016-06-11: qty 1

## 2016-06-11 MED ORDER — ACETAMINOPHEN 500 MG PO TABS
1000.0000 mg | ORAL_TABLET | ORAL | Status: DC
  Administered 2016-06-11: 1000 mg via ORAL
  Filled 2016-06-11: qty 2

## 2016-06-11 MED ORDER — SODIUM CHLORIDE 0.9 % IV SOLN
INTRAVENOUS | Status: DC
  Administered 2016-06-11: 10:00:00 via INTRAVENOUS

## 2016-06-11 MED ORDER — SODIUM CHLORIDE 0.9 % IV SOLN
300.0000 mg | INTRAVENOUS | Status: DC
  Administered 2016-06-11: 300 mg via INTRAVENOUS
  Filled 2016-06-11: qty 15

## 2016-06-11 NOTE — Progress Notes (Signed)
Uneventful Tysabri infusion.  Pt stayed . Post infusion.  Pt d/c ambulatory to lobby.  Next appointment given for 07/10/16 at 1000.  Pt knows to call MD with questions/concerns.

## 2016-07-10 ENCOUNTER — Encounter (HOSPITAL_COMMUNITY): Admission: RE | Admit: 2016-07-10 | Payer: Medicaid Other | Source: Ambulatory Visit

## 2016-07-29 ENCOUNTER — Other Ambulatory Visit: Payer: Self-pay | Admitting: Neurology

## 2016-07-29 DIAGNOSIS — G35 Multiple sclerosis: Secondary | ICD-10-CM

## 2016-07-29 NOTE — Progress Notes (Signed)
I have entered the order for Tysarbril

## 2016-08-09 ENCOUNTER — Encounter (HOSPITAL_COMMUNITY)
Admission: RE | Admit: 2016-08-09 | Discharge: 2016-08-09 | Disposition: A | Discharge status: Home / Self Care | Payer: Medicaid Other | Source: Ambulatory Visit | Attending: Neurology | Admitting: Neurology

## 2016-08-09 ENCOUNTER — Encounter (HOSPITAL_COMMUNITY): Payer: Self-pay

## 2016-08-09 DIAGNOSIS — G35 Multiple sclerosis: Secondary | ICD-10-CM | POA: Insufficient documentation

## 2016-08-09 MED ORDER — SODIUM CHLORIDE 0.9 % IV SOLN
500.0000 mg | INTRAVENOUS | Status: DC
  Filled 2016-08-09: qty 4

## 2016-08-09 MED ORDER — LORATADINE 10 MG PO TABS
10.0000 mg | ORAL_TABLET | ORAL | Status: DC
  Administered 2016-08-09: 10 mg via ORAL
  Filled 2016-08-09: qty 1

## 2016-08-09 MED ORDER — ACETAMINOPHEN 500 MG PO TABS
1000.0000 mg | ORAL_TABLET | ORAL | Status: DC
  Administered 2016-08-09: 1000 mg via ORAL
  Filled 2016-08-09: qty 2

## 2016-08-09 MED ORDER — EPINEPHRINE 0.3 MG/0.3ML IJ SOAJ
0.3000 mg | INTRAMUSCULAR | Status: DC
  Filled 2016-08-09: qty 0.3

## 2016-08-09 MED ORDER — SODIUM CHLORIDE 0.9 % IV SOLN
INTRAVENOUS | Status: DC
  Administered 2016-08-09: 10:00:00 via INTRAVENOUS

## 2016-08-09 MED ORDER — SODIUM CHLORIDE 0.9 % IV SOLN
300.0000 mg | INTRAVENOUS | Status: DC
  Administered 2016-08-09: 300 mg via INTRAVENOUS
  Filled 2016-08-09: qty 15

## 2016-09-06 ENCOUNTER — Encounter (HOSPITAL_COMMUNITY)
Admission: RE | Admit: 2016-09-06 | Discharge: 2016-09-06 | Disposition: A | Discharge status: Home / Self Care | Payer: Medicaid Other | Source: Ambulatory Visit | Attending: Neurology | Admitting: Neurology

## 2016-09-06 ENCOUNTER — Encounter (HOSPITAL_COMMUNITY): Payer: Self-pay

## 2016-09-06 DIAGNOSIS — G35 Multiple sclerosis: Secondary | ICD-10-CM | POA: Insufficient documentation

## 2016-09-06 MED ORDER — SODIUM CHLORIDE 0.9 % IV SOLN
300.0000 mg | INTRAVENOUS | Status: DC
  Administered 2016-09-06: 300 mg via INTRAVENOUS
  Filled 2016-09-06: qty 15

## 2016-09-06 MED ORDER — SODIUM CHLORIDE 0.9 % IV SOLN
INTRAVENOUS | Status: DC
  Administered 2016-09-06: 12:00:00 via INTRAVENOUS

## 2016-09-06 MED ORDER — LORATADINE 10 MG PO TABS
10.0000 mg | ORAL_TABLET | ORAL | Status: DC
  Administered 2016-09-06: 10 mg via ORAL
  Filled 2016-09-06: qty 1

## 2016-09-06 MED ORDER — ACETAMINOPHEN 500 MG PO TABS
1000.0000 mg | ORAL_TABLET | ORAL | Status: DC
  Administered 2016-09-06: 1000 mg via ORAL
  Filled 2016-09-06: qty 2

## 2016-09-06 NOTE — Discharge Instructions (Signed)
Natalizumab injection/Tysabri Infusion  °What is this medicine? °NATALIZUMAB (na ta LIZ you mab) is used to treat relapsing multiple sclerosis. This drug is not a cure. It is also used to treat Crohn's disease. °This medicine may be used for other purposes; ask your health care provider or pharmacist if you have questions. °COMMON BRAND NAME(S): Tysabri °What should I tell my health care provider before I take this medicine? °They need to know if you have any of these conditions: °-immune system problems °-progressive multifocal leukoencephalopathy (PML) °-an unusual or allergic reaction to natalizumab, other medicines, foods, dyes, or preservatives °-pregnant or trying to get pregnant °-breast-feeding °How should I use this medicine? °This medicine is for infusion into a vein. It is given by a health care professional in a hospital or clinic setting. °A special MedGuide will be given to you by the pharmacist with each prescription and refill. Be sure to read this information carefully each time. °Talk to your pediatrician regarding the use of this medicine in children. This medicine is not approved for use in children. °Overdosage: If you think you have taken too much of this medicine contact a poison control center or emergency room at once. °NOTE: This medicine is only for you. Do not share this medicine with others. °What if I miss a dose? °It is important not to miss your dose. Call your doctor or health care professional if you are unable to keep an appointment. °What may interact with this medicine? °-azathioprine °-cyclosporine °-interferon °-6-mercaptopurine °-methotrexate °-steroid medicines like prednisone or cortisone °-TNF-alpha inhibitors like adalimumab, etanercept, and infliximab °-vaccines °This list may not describe all possible interactions. Give your health care provider a list of all the medicines, herbs, non-prescription drugs, or dietary supplements you use. Also tell them if you smoke, drink  alcohol, or use illegal drugs. Some items may interact with your medicine. °What should I watch for while using this medicine? °Your condition will be monitored carefully while you are receiving this medicine. Visit your doctor for regular check ups. Tell your doctor or healthcare professional if your symptoms do not start to get better or if they get worse. °Stay away from people who are sick. Call your doctor or health care professional for advice if you get a fever, chills or sore throat, or other symptoms of a cold or flu. Do not treat yourself. °In some patients, this medicine may cause a serious brain infection that may cause death. If you have any problems seeing, thinking, speaking, walking, or standing, tell your doctor right away. If you cannot reach your doctor, get urgent medical care. °What side effects may I notice from receiving this medicine? °Side effects that you should report to your doctor or health care professional as soon as possible: °-allergic reactions like skin rash, itching or hives, swelling of the face, lips, or tongue °-breathing problems °-changes in vision °-chest pain °-dark urine °-depression, feelings of sadness °-dizziness °-general ill feeling or flu-like symptoms °-irregular, missed, or painful menstrual periods °-light-colored stools °-loss of appetite, nausea °-muscle weakness °-problems with balance, talking, or walking °-right upper belly pain °-unusually weak or tired °-yellowing of the eyes or skin °Side effects that usually do not require medical attention (report to your doctor or health care professional if they continue or are bothersome): °-aches, pains °-headache °-stomach upset °-tiredness °This list may not describe all possible side effects. Call your doctor for medical advice about side effects. You may report side effects to FDA at 1-800-FDA-1088. °Where should   I keep my medicine? °This drug is given in a hospital or clinic and will not be stored at  home. °NOTE: This sheet is a summary. It may not cover all possible information. If you have questions about this medicine, talk to your doctor, pharmacist, or health care provider. °© 2018 Elsevier/Gold Standard (2008-07-09 13:33:21) ° °

## 2016-09-06 NOTE — Progress Notes (Signed)
Uneventful Tysabri Infusion. Pt stayed 50 mins of the TOUCH recommended 1 hour post infusion observation time. Pt aware to contact MD of any difficulties.

## 2016-09-09 DIAGNOSIS — Z0289 Encounter for other administrative examinations: Secondary | ICD-10-CM

## 2016-09-11 ENCOUNTER — Ambulatory Visit (INDEPENDENT_AMBULATORY_CARE_PROVIDER_SITE_OTHER): Payer: Medicaid Other | Admitting: Adult Health

## 2016-09-11 ENCOUNTER — Encounter: Payer: Self-pay | Admitting: Adult Health

## 2016-09-11 ENCOUNTER — Encounter (INDEPENDENT_AMBULATORY_CARE_PROVIDER_SITE_OTHER): Payer: Self-pay

## 2016-09-11 VITALS — BP 158/105 | HR 64 | Wt 196.8 lb

## 2016-09-11 DIAGNOSIS — M62838 Other muscle spasm: Secondary | ICD-10-CM | POA: Diagnosis not present

## 2016-09-11 DIAGNOSIS — G35 Multiple sclerosis: Secondary | ICD-10-CM

## 2016-09-11 DIAGNOSIS — Z5181 Encounter for therapeutic drug level monitoring: Secondary | ICD-10-CM | POA: Diagnosis not present

## 2016-09-11 NOTE — Patient Instructions (Addendum)
Continue Tysabri and baclofen Follow-up with opthalmology  Blood work today If your symptoms worsen or you develop new symptoms please let us know.

## 2016-09-11 NOTE — Progress Notes (Signed)
PATIENT: Julie Pope DOB: 27-Aug-1980  REASON FOR VISIT: follow up- Multiple sclerosis HISTORY FROM: patient  HISTORY OF PRESENT ILLNESS: HISTORY copied from Dr. Zannie Cove notes: She has history of hypertension, in May 18, 2010,while walking towards her car at the end of her working day, her legs give out underneath her, she fell face down on the concrete pavement at her Parking lot infront of [employer], knocked out her front teeth.    MRI of the brain,in May 19, 2010 without contrast, demonstrated diffuse white matter changes throughout the supratentorial and infratentorial region, with corpus callosum involvement, MRI scan of the cervical spine shows demyelinating enhancing plaques at C 3 on right and C 4-5 on left. consistent with MS   Repeat MRI scan of the brain in 2012, showed multiple brainstem, cerebellar, subcortical, corpus callosal and periventricular white matter lesions typical for demyelinating disease. Several T 1 black holes and enhancing lesions are seen bilaterally suggesting chronic and active disease. Significant progression and worsening compared with noncontrast MRI 05/19/10.  CSF showed elevated Ig G index, 9 ocb, extensive lab test for mimic were negative, including HIV, RPR, b12 b6, ACE lyme SSA, B, CBC, CMP, PEP, ana, copper, mild ele crp 6.5, and very low vit D<4.   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 In 05/17/13,  index value was 1.17. Positive with titer of 1.6 9 in June 07 2014  UPDATE Jan 5th 2016: She was on long term disability for 2-3 years, now she is in the process of applying for social disability, waiting for the  final decision. She still has mild gait difficulty, muscle spasm, urinary urgency, occasional bowel urgency.  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 she started baclofen 10 mg 3 times a day since January fifth 2016, mild drowsiness, does help her lower extremity spasticity.  UPDATE March 22nd 2016: We have reviewed MRI of brain Feb 2016, stable, but moderate burden of round and ovoid, periventricular, subcortical, pericallosal, juxtacortical, midbrain, pontine, cerebellar and cervicomedullary junction chronic demyelinating plaques. Many of these are hypointense on T1 views. No abnormal lesions are seen on post contrast views. Compared to MRI on 12/02/13, no significant change.  She continues to have occasional falling episode, mild unsteady gait.  UPDATE May 19th 2016: She came in for [facility] 9-21 screening visit, She has Mirena IUD insertion in May 6th 2016 at [facility], also using condom contraceptives since 2011, I have emphasized the importance of contraception, she voiced understanding  UPDATE January 22 2016: She is here with her mother and daughter at today's clinical visit, she has slow worsening gait abnormality, dragging her right foot more, also noticed more clumsiness at her right hand since Nov 2016,   She is still taking baclofen 10 mg twice a day, she is receiving Tysarbri infusion every month at [facility] stay, most recent infusion was January 19 2016, she tolerated the infusion well, no significant side effect noticed. She is still finding her disability,  UPDATE Oct 11th 2017: Laboratory evaluation showed negative anti-Tysarbri antibody,normal CMP, CBC, significantly low vitamin D level 9.4, normal CBC.  She has received IV Solu-Medrol 3 days in September 2017 without  significant improvement, We have personally reviewed and compared to MRI scans, most recent September 2017, multiple T2/FLAIR hyperintensity foci within the brain stem, cerebellum, middle cerebellar peduncle, spinal cord periventricular, chest of cortical and deep white matter, no contrast enhancement, no significant change compared to 2016.  Today 09/11/2016:   Julie Pope is a 36 year old female with a history of multiple sclerosis. She returns today for follow-up. She is currently on Tysabri and tolerates it well. She denies any new numbness or weakness. She states occasionally she will have tingling in the fingers. She reports that her hands are always cold. She has noticed some slight weakness in the right grip strength however this has not been a sudden change would rather than progressive. Denies any changes in her gait or balance. She states that she continues to drag the right foot when ambulating. She uses a cane. Reports that over a month ago she started noticing intermittent blurry vision. She states the blurry vision affects both eyes. They can occur at different times. Reports it only lasts for about 15 minutes. She reports on average she may have 2 episodes a week. She has not seen an ophthalmologist or optometrist. Denies any changes with the bowels or bladder. The patient does have spasticity in the lower extremities. She is taking baclofen 10 mg 3 times a day.  REVIEW OF SYSTEMS: Out of a complete 14 system review of symptoms, the patient complains only of the following symptoms, and all other reviewed systems are negative.  Blurred vision, muscle cramps, facial drooping, headache  ALLERGIES: No Known Allergies  HOME MEDICATIONS: Outpatient Medications Prior to Visit  Medication Sig Dispense Refill  . atenolol (TENORMIN) 50 MG tablet Take 50 mg by mouth daily.      . baclofen (LIORESAL) 10 MG tablet One tab po tid 270 each 4  . calcium citrate (CALCITRATE - DOSED IN MG ELEMENTAL  CALCIUM) 950 MG tablet Take 1 tablet by mouth daily.      . Cholecalciferol (VITAMIN D3) 1000 units CAPS Take by mouth daily.    Marland Kitchen lisinopril (PRINIVIL,ZESTRIL) 10 MG tablet Take 2 tablets (20 mg total) by mouth daily. 60 tablet 6  . natalizumab (TYSABRI) 300 MG/15ML injection Inject into the vein every 30 (thirty) days.     No facility-administered medications prior to visit.     PAST MEDICAL HISTORY: Past Medical History:  Diagnosis Date  . Abnormality of gait   . Hypertension   . MS (multiple sclerosis) (HCC)   . Other nonspecific abnormal result of function study of brain and central nervous system   . Personal history of fall     PAST SURGICAL HISTORY: Past Surgical History:  Procedure Laterality Date  . CHOLECYSTECTOMY    . GALLBLADDER SURGERY      FAMILY HISTORY: Family History  Problem Relation Age of Onset  . Cancer Maternal Grandmother   . High blood pressure      Runs in both sides of the family    SOCIAL HISTORY: Social History   Social History  . Marital status: Single    Spouse name: N/A  . Number of children: 2  . Years of education: 12   Occupational History  . FILE CLERK [employer]   Social History Main Topics  . Smoking status: Never Smoker  .  Smokeless tobacco: Never Used  . Alcohol use No  . Drug use: No  . Sexual activity: Not on file   Other Topics Concern  . Not on file   Social History Narrative   Patient has a high school education and two children. Patient is not currently working.   Patient is right-handed.   Patient drinks three cups of tea daily and two cans of soda daily.      PHYSICAL EXAM  Vitals:   09/11/16 1126  BP: (!) 158/105  Pulse: 64  Weight: 196 lb 12.8 oz (89.3 kg)   Body mass index is 33.78 kg/m.  Generalized: Well developed, in no acute distress   Neurological examination  Mentation: Alert oriented to time, place, history taking. Follows all commands speech and language fluent Cranial nerve  II-XII: Pupils were equal round reactive to light. Extraocular movements were full, visual field were full on confrontational test. Facial sensation and strength were normal. Uvula tongue midline. Head turning and shoulder shrug  were normal and symmetric. Motor: The motor testing reveals 5 over 5 strength of all 4 extremities. Good symmetric motor tone is noted throughout.  Sensory: Sensory testing is intact to soft touch on all 4 extremities. No evidence of extinction is noted.  Coordination: Cerebellar testing reveals good finger-nose-finger and heel-to-shin bilaterally.  Gait and station:   Romberg is patient is slightly dragging the right foot when integrating. Tandem gait is unsteady. Romberg is negative Reflexes: Deep tendon reflexes are symmetric and normal bilaterally in the upper extremity. Hyperreflexic in the lower extremities   DIAGNOSTIC DATA (LABS, IMAGING, TESTING) - I reviewed patient records, labs, notes, testing and imaging myself where available.  Lab Results  Component Value Date   WBC 10.2 01/22/2016   HGB 13.0 06/07/2014   HCT 35.6 01/22/2016   MCV 84 01/22/2016   PLT 378 01/22/2016      Component Value Date/Time   NA 139 01/22/2016 1337   K 4.5 01/22/2016 1337   CL 101 01/22/2016 1337   CO2 24 01/22/2016 1337   GLUCOSE 94 01/22/2016 1337   GLUCOSE 84 05/18/2010 1900   BUN 10 01/22/2016 1337   CREATININE 0.88 01/22/2016 1337   CALCIUM 9.2 01/22/2016 1337   PROT 7.3 01/22/2016 1337   ALBUMIN 4.2 01/22/2016 1337   AST 11 01/22/2016 1337   ALT 10 01/22/2016 1337   ALKPHOS 92 01/22/2016 1337   BILITOT 0.4 01/22/2016 1337   GFRNONAA 84 01/22/2016 1337   GFRAA 97 01/22/2016 1337   No results found for: CHOL, HDL, LDLCALC, LDLDIRECT, TRIG, CHOLHDL No results found for: ONGE9B No results found for: VITAMINB12 Lab Results  Component Value Date   TSH 1.120 01/22/2016      ASSESSMENT AND PLAN 36 y.o. year old female  has a past medical history of  Abnormality of gait; Hypertension; MS (multiple sclerosis) (HCC); Other nonspecific abnormal result of function study of brain and central nervous system; and Personal history of fall. here with:  1. Multiple sclerosis 2. Muscle spasticity  The patient recently had MRI of the brain and cervical spine in September 2017 that did not show any significant changes. She will continue on Tysabri. I will check blood work today. She is advised that she should follow-up with ophthalmology. She will continue on baclofen for muscle spasticity. She is advised that if her symptoms worsen or she develops new symptoms she should let us know. She will follow-up in 6 months or sooner if needed.   Aundra Millet  Ethelene Browns, MSN, NP-C 09/11/2016, 11:23 AM [facility] [address] [place], [place] [address] [phone]

## 2016-09-11 NOTE — Progress Notes (Signed)
I have reviewed and agreed above plan. 

## 2016-09-12 ENCOUNTER — Telehealth: Payer: Self-pay | Admitting: *Deleted

## 2016-09-12 LAB — COMPREHENSIVE METABOLIC PANEL
ALK PHOS: 97 IU/L (ref 39–117)
ALT: 9 IU/L (ref 0–32)
AST: 11 IU/L (ref 0–40)
Albumin/Globulin Ratio: 1.5 (ref 1.2–2.2)
Albumin: 4.3 g/dL (ref 3.5–5.5)
BUN/Creatinine Ratio: 9 (ref 9–23)
BUN: 8 mg/dL (ref 6–20)
Bilirubin Total: 0.4 mg/dL (ref 0.0–1.2)
CO2: 24 mmol/L (ref 18–29)
CREATININE: 0.89 mg/dL (ref 0.57–1.00)
Calcium: 9.4 mg/dL (ref 8.7–10.2)
Chloride: 103 mmol/L (ref 96–106)
GFR calc Af Amer: 96 mL/min/{1.73_m2} (ref >59)
GFR calc non Af Amer: 83 mL/min/{1.73_m2} (ref >59)
GLOBULIN, TOTAL: 2.9 g/dL (ref 1.5–4.5)
Glucose: 88 mg/dL (ref 65–99)
POTASSIUM: 4.9 mmol/L (ref 3.5–5.2)
SODIUM: 143 mmol/L (ref 134–144)
Total Protein: 7.2 g/dL (ref 6.0–8.5)

## 2016-09-12 LAB — CBC WITH DIFFERENTIAL/PLATELET
Basophils Absolute: 0 10*3/uL (ref 0.0–0.2)
Basos: 0 %
EOS (ABSOLUTE): 0.2 10*3/uL (ref 0.0–0.4)
EOS: 2 %
Hematocrit: 35.3 % (ref 34.0–46.6)
Hemoglobin: 11.6 g/dL (ref 11.1–15.9)
IMMATURE GRANULOCYTES: 0 %
Immature Grans (Abs): 0 10*3/uL (ref 0.0–0.1)
Lymphocytes Absolute: 3.9 10*3/uL — ABNORMAL HIGH (ref 0.7–3.1)
Lymphs: 41 %
MCH: 27 pg (ref 26.6–33.0)
MCHC: 32.9 g/dL (ref 31.5–35.7)
MCV: 82 fL (ref 79–97)
MONOS ABS: 0.5 10*3/uL (ref 0.1–0.9)
Monocytes: 5 %
NEUTROS PCT: 52 %
Neutrophils Absolute: 5.1 10*3/uL (ref 1.4–7.0)
PLATELETS: 357 10*3/uL (ref 150–379)
RBC: 4.3 x10E6/uL (ref 3.77–5.28)
RDW: 15.5 % — AB (ref 12.3–15.4)
WBC: 9.7 10*3/uL (ref 3.4–10.8)

## 2016-09-12 NOTE — Telephone Encounter (Signed)
Attempted to reach patient, phone was answered but then hung up. Called back and LVM informing patient her labs are unremarkable, JCV pending. Advised she'll get a call when those results are available. Left number for any questions.

## 2016-10-04 ENCOUNTER — Encounter (HOSPITAL_COMMUNITY): Payer: Self-pay

## 2016-10-04 ENCOUNTER — Encounter (HOSPITAL_COMMUNITY)
Admission: RE | Admit: 2016-10-04 | Discharge: 2016-10-04 | Disposition: A | Discharge status: Home / Self Care | Payer: Medicaid Other | Source: Ambulatory Visit | Attending: Neurology | Admitting: Neurology

## 2016-10-04 DIAGNOSIS — G35 Multiple sclerosis: Secondary | ICD-10-CM | POA: Diagnosis present

## 2016-10-04 MED ORDER — ACETAMINOPHEN 500 MG PO TABS
1000.0000 mg | ORAL_TABLET | ORAL | Status: AC
  Administered 2016-10-04: 1000 mg via ORAL
  Filled 2016-10-04: qty 2

## 2016-10-04 MED ORDER — SODIUM CHLORIDE 0.9 % IV SOLN
INTRAVENOUS | Status: AC
  Administered 2016-10-04: 11:00:00 via INTRAVENOUS

## 2016-10-04 MED ORDER — SODIUM CHLORIDE 0.9 % IV SOLN
300.0000 mg | INTRAVENOUS | Status: AC
  Administered 2016-10-04: 300 mg via INTRAVENOUS
  Filled 2016-10-04: qty 15

## 2016-10-04 MED ORDER — LORATADINE 10 MG PO TABS
10.0000 mg | ORAL_TABLET | ORAL | Status: AC
  Administered 2016-10-04: 10 mg via ORAL
  Filled 2016-10-04: qty 1

## 2016-10-04 NOTE — Discharge Instructions (Signed)
Natalizumab injection/Tysabri Infusion  °What is this medicine? °NATALIZUMAB (na ta LIZ you mab) is used to treat relapsing multiple sclerosis. This drug is not a cure. It is also used to treat Crohn's disease. °This medicine may be used for other purposes; ask your health care provider or pharmacist if you have questions. °COMMON BRAND NAME(S): Tysabri °What should I tell my health care provider before I take this medicine? °They need to know if you have any of these conditions: °-immune system problems °-progressive multifocal leukoencephalopathy (PML) °-an unusual or allergic reaction to natalizumab, other medicines, foods, dyes, or preservatives °-pregnant or trying to get pregnant °-breast-feeding °How should I use this medicine? °This medicine is for infusion into a vein. It is given by a health care professional in a hospital or clinic setting. °A special MedGuide will be given to you by the pharmacist with each prescription and refill. Be sure to read this information carefully each time. °Talk to your pediatrician regarding the use of this medicine in children. This medicine is not approved for use in children. °Overdosage: If you think you have taken too much of this medicine contact a poison control center or emergency room at once. °NOTE: This medicine is only for you. Do not share this medicine with others. °What if I miss a dose? °It is important not to miss your dose. Call your doctor or health care professional if you are unable to keep an appointment. °What may interact with this medicine? °-azathioprine °-cyclosporine °-interferon °-6-mercaptopurine °-methotrexate °-steroid medicines like prednisone or cortisone °-TNF-alpha inhibitors like adalimumab, etanercept, and infliximab °-vaccines °This list may not describe all possible interactions. Give your health care provider a list of all the medicines, herbs, non-prescription drugs, or dietary supplements you use. Also tell them if you smoke, drink  alcohol, or use illegal drugs. Some items may interact with your medicine. °What should I watch for while using this medicine? °Your condition will be monitored carefully while you are receiving this medicine. Visit your doctor for regular check ups. Tell your doctor or healthcare professional if your symptoms do not start to get better or if they get worse. °Stay away from people who are sick. Call your doctor or health care professional for advice if you get a fever, chills or sore throat, or other symptoms of a cold or flu. Do not treat yourself. °In some patients, this medicine may cause a serious brain infection that may cause death. If you have any problems seeing, thinking, speaking, walking, or standing, tell your doctor right away. If you cannot reach your doctor, get urgent medical care. °What side effects may I notice from receiving this medicine? °Side effects that you should report to your doctor or health care professional as soon as possible: °-allergic reactions like skin rash, itching or hives, swelling of the face, lips, or tongue °-breathing problems °-changes in vision °-chest pain °-dark urine °-depression, feelings of sadness °-dizziness °-general ill feeling or flu-like symptoms °-irregular, missed, or painful menstrual periods °-light-colored stools °-loss of appetite, nausea °-muscle weakness °-problems with balance, talking, or walking °-right upper belly pain °-unusually weak or tired °-yellowing of the eyes or skin °Side effects that usually do not require medical attention (report to your doctor or health care professional if they continue or are bothersome): °-aches, pains °-headache °-stomach upset °-tiredness °This list may not describe all possible side effects. Call your doctor for medical advice about side effects. You may report side effects to FDA at 1-800-FDA-1088. °Where should   I keep my medicine? °This drug is given in a hospital or clinic and will not be stored at  home. °NOTE: This sheet is a summary. It may not cover all possible information. If you have questions about this medicine, talk to your doctor, pharmacist, or health care provider. °© 2018 Elsevier/Gold Standard (2008-07-09 13:33:21) ° °

## 2016-10-30 ENCOUNTER — Other Ambulatory Visit: Payer: Self-pay | Admitting: Neurology

## 2016-10-30 DIAGNOSIS — G35 Multiple sclerosis: Secondary | ICD-10-CM

## 2016-10-30 NOTE — Progress Notes (Signed)
I have entered an order for [facility] short stay

## 2016-11-04 ENCOUNTER — Encounter (HOSPITAL_COMMUNITY)
Admission: RE | Admit: 2016-11-04 | Discharge: 2016-11-04 | Disposition: A | Discharge status: Home / Self Care | Payer: Medicaid Other | Source: Ambulatory Visit | Attending: Neurology | Admitting: Neurology

## 2016-11-04 ENCOUNTER — Encounter (HOSPITAL_COMMUNITY): Payer: Self-pay

## 2016-11-04 DIAGNOSIS — G35 Multiple sclerosis: Secondary | ICD-10-CM | POA: Insufficient documentation

## 2016-11-04 MED ORDER — SODIUM CHLORIDE 0.9 % IV SOLN
INTRAVENOUS | Status: DC
  Administered 2016-11-04: 11:00:00 via INTRAVENOUS

## 2016-11-04 MED ORDER — ACETAMINOPHEN 500 MG PO TABS
1000.0000 mg | ORAL_TABLET | ORAL | Status: DC
  Administered 2016-11-04: 1000 mg via ORAL
  Filled 2016-11-04: qty 2

## 2016-11-04 MED ORDER — SODIUM CHLORIDE 0.9 % IV SOLN
300.0000 mg | INTRAVENOUS | Status: DC
  Administered 2016-11-04: 300 mg via INTRAVENOUS
  Filled 2016-11-04: qty 15

## 2016-11-04 MED ORDER — LORATADINE 10 MG PO TABS
10.0000 mg | ORAL_TABLET | ORAL | Status: DC
  Administered 2016-11-04: 10 mg via ORAL
  Filled 2016-11-04: qty 1

## 2016-11-04 NOTE — Progress Notes (Signed)
Patient does not want to stay for one hour post infusion observation period. Has received this medication for years.

## 2016-11-28 ENCOUNTER — Telehealth: Payer: Self-pay | Admitting: *Deleted

## 2016-11-28 NOTE — Telephone Encounter (Signed)
Called [employer], spoke with [place] and requested copy of 09/11/16 JCV results faxed to this office.  JCV 2.88  Positive.

## 2016-12-03 ENCOUNTER — Encounter (HOSPITAL_COMMUNITY)
Admission: RE | Admit: 2016-12-03 | Discharge: 2016-12-03 | Disposition: A | Discharge status: Home / Self Care | Payer: Medicaid Other | Source: Ambulatory Visit | Attending: Neurology | Admitting: Neurology

## 2016-12-03 ENCOUNTER — Encounter (HOSPITAL_COMMUNITY): Payer: Self-pay

## 2016-12-03 DIAGNOSIS — G35 Multiple sclerosis: Secondary | ICD-10-CM | POA: Diagnosis not present

## 2016-12-03 MED ORDER — ACETAMINOPHEN 500 MG PO TABS
1000.0000 mg | ORAL_TABLET | ORAL | Status: DC
  Administered 2016-12-03: 1000 mg via ORAL
  Filled 2016-12-03: qty 2

## 2016-12-03 MED ORDER — LORATADINE 10 MG PO TABS
10.0000 mg | ORAL_TABLET | ORAL | Status: DC
Start: 2016-12-03 — End: 2016-12-04
  Administered 2016-12-03: 10 mg via ORAL
  Filled 2016-12-03: qty 1

## 2016-12-03 MED ORDER — SODIUM CHLORIDE 0.9 % IV SOLN
INTRAVENOUS | Status: DC
  Administered 2016-12-03: 11:00:00 via INTRAVENOUS

## 2016-12-03 MED ORDER — NATALIZUMAB 300 MG/15ML IV CONC
300.0000 mg | INTRAVENOUS | Status: DC
  Administered 2016-12-03: 300 mg via INTRAVENOUS
  Filled 2016-12-03: qty 15

## 2016-12-03 NOTE — Progress Notes (Signed)
Patient stayed 15 min out of recommended 1 hour observation. Patient aware of recommended time. Her BP was high upon arrival and start of infusion then lowered to normal levels. She is on bp med and states she checks it "sometimes" at home. Informed patient to check at home and call her primary MD if bp continues to be high.

## 2016-12-30 ENCOUNTER — Telehealth: Payer: Self-pay | Admitting: Neurology

## 2016-12-30 DIAGNOSIS — G35 Multiple sclerosis: Secondary | ICD-10-CM

## 2016-12-30 NOTE — Telephone Encounter (Addendum)
She is aware of her upward trending JCV result.  She is agreeable to repeat MRI and will keep her pending follow up in October.

## 2016-12-30 NOTE — Telephone Encounter (Signed)
Reviewed her chart, patient was started on Tysabri since 2012, positive JC virus, titer was trending up, we will repeat MRI of the brain with and without contrast, has follow-up appointments in October 2018, will reevaluate patient, make decision about long-term immunomodulation therapy then

## 2016-12-30 NOTE — Telephone Encounter (Signed)
Left message for a return call

## 2017-01-02 ENCOUNTER — Ambulatory Visit
Admission: RE | Admit: 2017-01-02 | Discharge: 2017-01-02 | Disposition: A | Discharge status: Home / Self Care | Payer: Medicaid Other | Source: Ambulatory Visit | Attending: Neurology | Admitting: Neurology

## 2017-01-02 DIAGNOSIS — G35 Multiple sclerosis: Secondary | ICD-10-CM

## 2017-01-02 MED ORDER — GADOBENATE DIMEGLUMINE 529 MG/ML IV SOLN
17.0000 mL | Freq: Once | INTRAVENOUS | Status: AC | PRN
  Administered 2017-01-02: 17 mL via INTRAVENOUS

## 2017-01-03 ENCOUNTER — Encounter (HOSPITAL_COMMUNITY)
Admission: RE | Admit: 2017-01-03 | Discharge: 2017-01-03 | Disposition: A | Discharge status: Home / Self Care | Payer: Medicaid Other | Source: Ambulatory Visit | Attending: Neurology | Admitting: Neurology

## 2017-01-03 ENCOUNTER — Encounter (HOSPITAL_COMMUNITY): Payer: Self-pay

## 2017-01-03 DIAGNOSIS — G35 Multiple sclerosis: Secondary | ICD-10-CM | POA: Insufficient documentation

## 2017-01-03 MED ORDER — LORATADINE 10 MG PO TABS
10.0000 mg | ORAL_TABLET | ORAL | Status: AC
  Administered 2017-01-03: 10 mg via ORAL
  Filled 2017-01-03: qty 1

## 2017-01-03 MED ORDER — SODIUM CHLORIDE 0.9 % IV SOLN
INTRAVENOUS | Status: AC
  Administered 2017-01-03: 11:00:00 via INTRAVENOUS

## 2017-01-03 MED ORDER — SODIUM CHLORIDE 0.9 % IV SOLN
300.0000 mg | INTRAVENOUS | Status: AC
  Administered 2017-01-03: 300 mg via INTRAVENOUS
  Filled 2017-01-03: qty 15

## 2017-01-03 MED ORDER — ACETAMINOPHEN 500 MG PO TABS
1000.0000 mg | ORAL_TABLET | ORAL | Status: AC
Start: 2017-01-03 — End: 2017-01-03
  Administered 2017-01-03: 1000 mg via ORAL
  Filled 2017-01-03: qty 2

## 2017-01-03 NOTE — Progress Notes (Signed)
uneventul Tysabri infusion.  Pt stayed for 30 minutes post infusion.  Pt d/c ambulatory to lobby with significant other.

## 2017-01-08 ENCOUNTER — Telehealth: Payer: Self-pay | Admitting: *Deleted

## 2017-01-08 NOTE — Telephone Encounter (Signed)
Spoke to patient and informed her per Dr Terrace Arabia, her repeat MRI of the brain showed no significant change when compared to previous scan in September 2017.  Patient verbalized understanding, appreciation and confirmed her upcoming follow up in Oct.

## 2017-01-27 ENCOUNTER — Other Ambulatory Visit: Payer: Self-pay | Admitting: Neurology

## 2017-01-27 DIAGNOSIS — G35 Multiple sclerosis: Secondary | ICD-10-CM

## 2017-01-27 NOTE — Progress Notes (Signed)
Order placed for Tysabri infusion

## 2017-02-04 ENCOUNTER — Encounter (HOSPITAL_COMMUNITY)
Admission: RE | Admit: 2017-02-04 | Discharge: 2017-02-04 | Disposition: A | Discharge status: Home / Self Care | Payer: Medicaid Other | Source: Ambulatory Visit | Attending: Neurology | Admitting: Neurology

## 2017-02-04 ENCOUNTER — Encounter (HOSPITAL_COMMUNITY): Payer: Self-pay

## 2017-02-04 DIAGNOSIS — G35 Multiple sclerosis: Secondary | ICD-10-CM | POA: Diagnosis not present

## 2017-02-04 MED ORDER — SODIUM CHLORIDE 0.9 % IV SOLN
300.0000 mg | INTRAVENOUS | Status: DC
  Administered 2017-02-04: 300 mg via INTRAVENOUS
  Filled 2017-02-04: qty 15

## 2017-02-04 MED ORDER — ACETAMINOPHEN 500 MG PO TABS
1000.0000 mg | ORAL_TABLET | ORAL | Status: DC
  Administered 2017-02-04: 1000 mg via ORAL
  Filled 2017-02-04: qty 2

## 2017-02-04 MED ORDER — LORATADINE 10 MG PO TABS
10.0000 mg | ORAL_TABLET | ORAL | Status: DC
  Administered 2017-02-04: 10 mg via ORAL
  Filled 2017-02-04: qty 1

## 2017-02-04 MED ORDER — SODIUM CHLORIDE 0.9 % IV SOLN
INTRAVENOUS | Status: DC
  Administered 2017-02-04: 12:00:00 via INTRAVENOUS

## 2017-02-04 NOTE — Progress Notes (Signed)
Uneventful Tysabri infusion.  Pt stayed for 15 minute flush post infusion.  Pt knows to call MD with questions and concerns.  Pt d/c ambulatory to lobby.

## 2017-03-04 ENCOUNTER — Encounter (HOSPITAL_COMMUNITY)
Admission: RE | Admit: 2017-03-04 | Discharge: 2017-03-04 | Disposition: A | Discharge status: Home / Self Care | Payer: Medicaid Other | Source: Ambulatory Visit | Attending: Neurology | Admitting: Neurology

## 2017-03-04 ENCOUNTER — Encounter (HOSPITAL_COMMUNITY): Payer: Self-pay

## 2017-03-04 DIAGNOSIS — G35 Multiple sclerosis: Secondary | ICD-10-CM

## 2017-03-04 MED ORDER — SODIUM CHLORIDE 0.9 % IV SOLN
INTRAVENOUS | Status: DC
  Administered 2017-03-04: 09:00:00 via INTRAVENOUS

## 2017-03-04 MED ORDER — SODIUM CHLORIDE 0.9 % IV SOLN
300.0000 mg | INTRAVENOUS | Status: DC
  Administered 2017-03-04: 300 mg via INTRAVENOUS
  Filled 2017-03-04: qty 15

## 2017-03-04 MED ORDER — LORATADINE 10 MG PO TABS
10.0000 mg | ORAL_TABLET | ORAL | Status: DC
  Administered 2017-03-04: 10 mg via ORAL
  Filled 2017-03-04: qty 1

## 2017-03-04 MED ORDER — ACETAMINOPHEN 500 MG PO TABS
1000.0000 mg | ORAL_TABLET | ORAL | Status: DC
  Administered 2017-03-04: 1000 mg via ORAL
  Filled 2017-03-04: qty 2

## 2017-03-04 NOTE — Progress Notes (Signed)
Uneventful Tysabri infusion.  Pt stayed for 20 minutes post infusion.  Pt knows to call MD with questions and concerns.  Pt d/c ambulatory to lobby.  Next appointment given to pt also.

## 2017-03-13 ENCOUNTER — Ambulatory Visit (INDEPENDENT_AMBULATORY_CARE_PROVIDER_SITE_OTHER): Payer: Medicaid Other | Admitting: Neurology

## 2017-03-13 ENCOUNTER — Encounter (INDEPENDENT_AMBULATORY_CARE_PROVIDER_SITE_OTHER): Payer: Self-pay

## 2017-03-13 ENCOUNTER — Encounter: Payer: Self-pay | Admitting: Neurology

## 2017-03-13 VITALS — BP 137/101 | HR 72 | Ht 64.0 in | Wt 192.0 lb

## 2017-03-13 DIAGNOSIS — R269 Unspecified abnormalities of gait and mobility: Secondary | ICD-10-CM

## 2017-03-13 DIAGNOSIS — G35 Multiple sclerosis: Secondary | ICD-10-CM

## 2017-03-13 DIAGNOSIS — I10 Essential (primary) hypertension: Secondary | ICD-10-CM

## 2017-03-13 MED ORDER — ATENOLOL 50 MG PO TABS: 50.0000 mg | ORAL_TABLET | Freq: Every day | ORAL | 4 refills | Status: AC

## 2017-03-13 MED ORDER — LISINOPRIL 20 MG PO TABS: 20.0000 mg | ORAL_TABLET | Freq: Every day | ORAL | 11 refills | Status: DC

## 2017-03-13 NOTE — Progress Notes (Signed)
PATIENT: Julie Pope DOB: 27-Aug-1980  REASON FOR VISIT: follow up- Multiple sclerosis HISTORY FROM: patient  HISTORY OF PRESENT ILLNESS: HISTORY copied from Dr. Zannie Cove notes: She has history of hypertension, in May 18, 2010,while walking towards her car at the end of her working day, her legs give out underneath her, she fell face down on the concrete pavement at her Parking lot infront of [employer], knocked out her front teeth.    MRI of the brain,in May 19, 2010 without contrast, demonstrated diffuse white matter changes throughout the supratentorial and infratentorial region, with corpus callosum involvement, MRI scan of the cervical spine shows demyelinating enhancing plaques at C 3 on right and C 4-5 on left. consistent with MS   Repeat MRI scan of the brain in 2012, showed multiple brainstem, cerebellar, subcortical, corpus callosal and periventricular white matter lesions typical for demyelinating disease. Several T 1 black holes and enhancing lesions are seen bilaterally suggesting chronic and active disease. Significant progression and worsening compared with noncontrast MRI 05/19/10.  CSF showed elevated Ig G index, 9 ocb, extensive lab test for mimic were negative, including HIV, RPR, b12 b6, ACE lyme SSA, B, CBC, CMP, PEP, ana, copper, mild ele crp 6.5, and very low vit D<4.   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 In 05/17/13,  index value was 1.17. Positive with titer of 1.6 9 in June 07 2014  UPDATE Jan 5th 2016: She was on long term disability for 2-3 years, now she is in the process of applying for social disability, waiting for the  final decision. She still has mild gait difficulty, muscle spasm, urinary urgency, occasional bowel urgency.  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 she started baclofen 10 mg 3 times a day since January fifth 2016, mild drowsiness, does help her lower extremity spasticity.  UPDATE March 22nd 2016: We have reviewed MRI of brain Feb 2016, stable, but moderate burden of round and ovoid, periventricular, subcortical, pericallosal, juxtacortical, midbrain, pontine, cerebellar and cervicomedullary junction chronic demyelinating plaques. Many of these are hypointense on T1 views. No abnormal lesions are seen on post contrast views. Compared to MRI on 12/02/13, no significant change.  She continues to have occasional falling episode, mild unsteady gait.  UPDATE May 19th 2016: She came in for [facility] 9-21 screening visit, She has Mirena IUD insertion in May 6th 2016 at [facility], also using condom contraceptives since 2011, I have emphasized the importance of contraception, she voiced understanding  UPDATE January 22 2016: She is here with her mother and daughter at today's clinical visit, she has slow worsening gait abnormality, dragging her right foot more, also noticed more clumsiness at her right hand since Nov 2016,   She is still taking baclofen 10 mg twice a day, she is receiving Tysarbri infusion every month at [facility] stay, most recent infusion was January 19 2016, she tolerated the infusion well, no significant side effect noticed. She is still finding her disability,  UPDATE Oct 11th 2017: Laboratory evaluation showed negative anti-Tysarbri antibody,normal CMP, CBC, significantly low vitamin D level 9.4, normal CBC.  She has received IV Solu-Medrol 3 days in September 2017 without  significant improvement, We have personally reviewed and compared to MRI scans, most recent September 2017, multiple T2/FLAIR hyperintensity foci within the brain stem, cerebellum, middle cerebellar peduncle, spinal cord periventricular, chest of cortical and deep white matter, no contrast enhancement, no significant change compared to 2016.  UPDATE Mar 13 2017: She has loose bowels, gait abnormality, gradually increase so she fell sometimes, her blood pressure fluctuate, she is receiving Tysarbri every month,  Right leg dragging more.  JC-virus antibody was 2.88 positive, titer was trending up.  We have personally reviewed MRI of the brain with and without contrast in August in comparison to previous MRIs in 2017, extensive MS lesions, no contrast enhancement, no significant change  REVIEW OF SYSTEMS: Out of a complete 14 system review of symptoms, the patient complains only of the following symptoms, and all other reviewed systems are negative.  Blurred vision, muscle cramps, facial drooping, headache  ALLERGIES: No Known Allergies  HOME MEDICATIONS: Outpatient Medications Prior to Visit  Medication Sig Dispense Refill  . atenolol (TENORMIN) 50 MG tablet Take 50 mg by mouth daily.      . baclofen (LIORESAL) 10 MG tablet One tab po tid 270 each 4  . calcium citrate (CALCITRATE - DOSED IN MG ELEMENTAL CALCIUM) 950 MG tablet Take 1 tablet by mouth daily.      . Cholecalciferol (VITAMIN D3) 1000 units CAPS Take by mouth daily.    Marland Kitchen ibuprofen (ADVIL,MOTRIN) 800 MG tablet Take 800 mg by mouth as needed.    . methocarbamol (ROBAXIN) 500 MG tablet Take 500 mg by mouth as needed.    . natalizumab (TYSABRI) 300 MG/15ML injection Inject into the vein every 30 (thirty) days.    Marland Kitchen lisinopril (PRINIVIL,ZESTRIL) 10 MG tablet Take 2 tablets (20 mg total) by mouth daily. 60 tablet 6   No facility-administered medications prior to visit.     PAST MEDICAL HISTORY: Past Medical History:  Diagnosis  Date  . Abnormality of gait   . Hypertension   . MS (multiple sclerosis) (HCC)   . Other nonspecific abnormal result of function study of brain and central nervous system   . Personal history of fall     PAST SURGICAL HISTORY: Past Surgical History:  Procedure Laterality Date  . CHOLECYSTECTOMY    . GALLBLADDER SURGERY      FAMILY HISTORY: Family History  Problem Relation Age of Onset  . Cancer Maternal Grandmother   . High blood pressure Unknown        Runs in both sides of the family    SOCIAL HISTORY: Social History   Social History  . Marital status: Single    Spouse name: N/A  . Number of children: 2  . Years of education: 12   Occupational History  . FILE CLERK [employer]   Social History Main Topics  . Smoking status: Never Smoker  . Smokeless tobacco: Never Used  . Alcohol use No  . Drug use: No  . Sexual activity: Not on file   Other Topics Concern  . Not on file   Social History Narrative   Patient has a high school education and two children. Patient is not currently working.   Patient is right-handed.   Patient drinks three cups of tea daily and two cans of soda daily.  PHYSICAL EXAM  Vitals:   03/13/17 1019  BP: (!) 137/101  Pulse: 72  Weight: 192 lb (87.1 kg)  Height:  (1.626 m)   Body mass index is 32.96 kg/m.  Generalized: Well developed, in no acute distress   Neurological examination  Mentation: Alert oriented to time, place, history taking. Follows all commands speech and language fluent Cranial nerve II-XII: Pupils were equal round reactive to light. Extraocular movements were full, visual field were full on confrontational test. Facial sensation and strength were normal. Uvula tongue midline. Head turning and shoulder shrug  were normal and symmetric. Motor: The motor testing reveals 5 over 5 strength of all 4 extremities. Good symmetric motor tone is noted throughout.  Sensory: Sensory testing is intact to soft  touch on all 4 extremities. No evidence of extinction is noted.  Coordination: Cerebellar testing reveals good finger-nose-finger and heel-to-shin bilaterally.  Gait and station:  Stiff, cautious, dragging her right leg Reflexes: Deep tendon reflexes areNormal and normal bilaterally in the upper extremity. Hyperreflexic in the lower extremities   DIAGNOSTIC DATA (LABS, IMAGING, TESTING) - I reviewed patient records, labs, notes, testing and imaging myself where available.  Lab Results  Component Value Date   WBC 9.7 09/11/2016   HGB 11.6 09/11/2016   HCT 35.3 09/11/2016   MCV 82 09/11/2016   PLT 357 09/11/2016      Component Value Date/Time   NA 143 09/11/2016 1239   K 4.9 09/11/2016 1239   CL 103 09/11/2016 1239   CO2 24 09/11/2016 1239   GLUCOSE 88 09/11/2016 1239   GLUCOSE 84 05/18/2010 1900   BUN 8 09/11/2016 1239   CREATININE 0.89 09/11/2016 1239   CALCIUM 9.4 09/11/2016 1239   PROT 7.2 09/11/2016 1239   ALBUMIN 4.3 09/11/2016 1239   AST 11 09/11/2016 1239   ALT 9 09/11/2016 1239   ALKPHOS 97 09/11/2016 1239   BILITOT 0.4 09/11/2016 1239   GFRNONAA 83 09/11/2016 1239   GFRAA 96 09/11/2016 1239   No results found for: CHOL, HDL, LDLCALC, LDLDIRECT, TRIG, CHOLHDL No results found for: ONGE9B No results found for: VITAMINB12 Lab Results  Component Value Date   TSH 1.120 01/22/2016      ASSESSMENT AND PLAN 36 y.o. year old female   Relapsing remitting multiple sclerosis Slow worsening gait abnormality Slow trending up of JC virus titer, most recent in August 2018 was 2.88  She received Tysarbri treatment since 2012,  With slow trending up of JC virus titer, this has put at a higher risk group for PML  She also has slow worsening gait abnormality  After discuss with patient and her mother, we will proceed with Ocrelizumab  Laboratory evaluations   Levert Feinstein, M.D. Ph.D.  [facility] [address] [place], [place]  [address] Phone: [phone] Fax:      [phone]

## 2017-03-14 LAB — COMPREHENSIVE METABOLIC PANEL
ALBUMIN: 4.4 g/dL (ref 3.5–5.5)
ALK PHOS: 102 IU/L (ref 39–117)
ALT: 7 IU/L (ref 0–32)
AST: 13 IU/L (ref 0–40)
Albumin/Globulin Ratio: 1.4 (ref 1.2–2.2)
BUN / CREAT RATIO: 13 (ref 9–23)
BUN: 12 mg/dL (ref 6–20)
Bilirubin Total: 0.4 mg/dL (ref 0.0–1.2)
CALCIUM: 9.1 mg/dL (ref 8.7–10.2)
CHLORIDE: 101 mmol/L (ref 96–106)
CO2: 23 mmol/L (ref 20–29)
Creatinine, Ser: 0.91 mg/dL (ref 0.57–1.00)
GFR calc Af Amer: 93 mL/min/{1.73_m2} (ref >59)
GFR, EST NON AFRICAN AMERICAN: 80 mL/min/{1.73_m2} (ref >59)
GLUCOSE: 78 mg/dL (ref 65–99)
Globulin, Total: 3.2 g/dL (ref 1.5–4.5)
Potassium: 4.8 mmol/L (ref 3.5–5.2)
SODIUM: 140 mmol/L (ref 134–144)
TOTAL PROTEIN: 7.6 g/dL (ref 6.0–8.5)

## 2017-03-14 LAB — CBC WITH DIFFERENTIAL
BASOS ABS: 0 10*3/uL (ref 0.0–0.2)
Basos: 0 %
EOS (ABSOLUTE): 0.1 10*3/uL (ref 0.0–0.4)
EOS: 1 %
HEMOGLOBIN: 11.1 g/dL (ref 11.1–15.9)
Hematocrit: 35.2 % (ref 34.0–46.6)
IMMATURE GRANS (ABS): 0 10*3/uL (ref 0.0–0.1)
IMMATURE GRANULOCYTES: 0 %
LYMPHS: 40 %
Lymphocytes Absolute: 3.6 10*3/uL — ABNORMAL HIGH (ref 0.7–3.1)
MCH: 26.6 pg (ref 26.6–33.0)
MCHC: 31.5 g/dL (ref 31.5–35.7)
MCV: 84 fL (ref 79–97)
MONOCYTES: 6 %
Monocytes Absolute: 0.5 10*3/uL (ref 0.1–0.9)
Neutrophils Absolute: 4.7 10*3/uL (ref 1.4–7.0)
Neutrophils: 53 %
RBC: 4.17 x10E6/uL (ref 3.77–5.28)
RDW: 15.1 % (ref 12.3–15.4)
WBC: 9.1 10*3/uL (ref 3.4–10.8)

## 2017-03-14 LAB — HEPATITIS C ANTIBODY: Hep C Virus Ab: <0.1 s/co ratio (ref 0.0–0.9)

## 2017-03-14 LAB — RPR: RPR Ser Ql: NONREACTIVE

## 2017-03-14 LAB — VARICELLA ZOSTER ANTIBODY, IGG: Varicella zoster IgG: 3397 index (ref >165)

## 2017-03-14 LAB — HIV ANTIBODY (ROUTINE TESTING W REFLEX): HIV SCREEN 4TH GENERATION: NONREACTIVE

## 2017-03-14 LAB — TSH: TSH: 0.959 u[IU]/mL (ref 0.450–4.500)

## 2017-03-14 LAB — HEPATITIS B CORE ANTIBODY, TOTAL: HEP B C TOTAL AB: NEGATIVE

## 2017-03-14 LAB — HEPATITIS B SURFACE ANTIBODY,QUALITATIVE: Hep B Surface Ab, Qual: NONREACTIVE

## 2017-03-14 LAB — HEPATITIS B SURFACE ANTIGEN: Hepatitis B Surface Ag: NEGATIVE

## 2017-03-20 ENCOUNTER — Encounter: Payer: Self-pay | Admitting: *Deleted

## 2017-03-20 ENCOUNTER — Telehealth: Payer: Self-pay | Admitting: *Deleted

## 2017-03-20 NOTE — Telephone Encounter (Signed)
Labs collected 03/13/17:  JCV 2.68

## 2017-03-24 ENCOUNTER — Telehealth: Payer: Self-pay | Admitting: Neurology

## 2017-03-24 LAB — QUANTIFERON-TB GOLD PLUS
QUANTIFERON NIL VALUE: 0.05 [IU]/mL
QUANTIFERON TB2 AG VALUE: 0.06 [IU]/mL
QuantiFERON Mitogen Value: >10 IU/mL
QuantiFERON TB1 Ag Value: 0.04 IU/mL
QuantiFERON-TB Gold Plus: NEGATIVE

## 2017-03-24 LAB — SPECIMEN STATUS REPORT

## 2017-03-24 NOTE — Telephone Encounter (Signed)
Spoke to patient - states our office will be receiving a request from disability requesting records.

## 2017-03-24 NOTE — Telephone Encounter (Signed)
Patient is calling to discuss her disability.

## 2017-03-25 ENCOUNTER — Other Ambulatory Visit: Payer: Self-pay | Admitting: *Deleted

## 2017-03-25 MED ORDER — OCRELIZUMAB 300 MG/10ML IV SOLN: INTRAVENOUS | 1 refills | Status: DC

## 2017-03-26 ENCOUNTER — Encounter: Payer: Self-pay | Admitting: *Deleted

## 2017-03-26 ENCOUNTER — Telehealth: Payer: Self-pay | Admitting: Neurology

## 2017-03-27 ENCOUNTER — Telehealth: Payer: Self-pay | Admitting: *Deleted

## 2017-03-27 NOTE — Telephone Encounter (Signed)
Submitted two PA request to [employer] for approval of Ocrevus.  Both requests were denied.  They require the patient to try two of the folllowing:  Rebif, Betaseron, Copaxone, Avonex, Aubagio, Tecfidera, Gilenya.  She has only failed Tysabri.  Per vo by Dr. Terrace Arabia, she will keep patient on Tysabri and attempt approval again in the new plan year in hopes that the formulary will change. Patient aware.  She has a pending appt for her next infusion on 04/03/17.

## 2017-03-27 NOTE — Telephone Encounter (Signed)
She was denied for ocrelizumab, continue Tysarbri infusion

## 2017-04-01 ENCOUNTER — Other Ambulatory Visit: Payer: Self-pay | Admitting: Neurology

## 2017-04-01 DIAGNOSIS — G35 Multiple sclerosis: Secondary | ICD-10-CM

## 2017-04-03 ENCOUNTER — Encounter (HOSPITAL_COMMUNITY)
Admission: RE | Admit: 2017-04-03 | Discharge: 2017-04-03 | Disposition: A | Discharge status: Home / Self Care | Payer: Medicaid Other | Source: Ambulatory Visit | Attending: Neurology | Admitting: Neurology

## 2017-04-03 ENCOUNTER — Encounter (HOSPITAL_COMMUNITY): Payer: Self-pay

## 2017-04-03 DIAGNOSIS — G35 Multiple sclerosis: Secondary | ICD-10-CM | POA: Diagnosis present

## 2017-04-03 MED ORDER — LORATADINE 10 MG PO TABS
10.0000 mg | ORAL_TABLET | ORAL | Status: DC
  Administered 2017-04-03: 10 mg via ORAL
  Filled 2017-04-03: qty 1

## 2017-04-03 MED ORDER — SODIUM CHLORIDE 0.9 % IV SOLN
INTRAVENOUS | Status: DC
  Administered 2017-04-03: 10:00:00 via INTRAVENOUS

## 2017-04-03 MED ORDER — ACETAMINOPHEN 500 MG PO TABS
1000.0000 mg | ORAL_TABLET | ORAL | Status: DC
  Administered 2017-04-03: 1000 mg via ORAL
  Filled 2017-04-03: qty 2

## 2017-04-03 MED ORDER — SODIUM CHLORIDE 0.9 % IV SOLN
300.0000 mg | INTRAVENOUS | Status: DC
  Administered 2017-04-03: 300 mg via INTRAVENOUS
  Filled 2017-04-03: qty 15

## 2017-04-03 NOTE — Progress Notes (Signed)
Patient stayed 20 min out of one hour observation time posy Tysabri. Aware to call MD with any problems/questions.

## 2017-04-03 NOTE — Discharge Instructions (Signed)
°Tysabri °Natalizumab injection °What is this medicine? °NATALIZUMAB (na ta LIZ you mab) is used to treat relapsing multiple sclerosis. This drug is not a cure. It is also used to treat Crohn's disease. °This medicine may be used for other purposes; ask your health care provider or pharmacist if you have questions. °COMMON BRAND NAME(S): Tysabri °What should I tell my health care provider before I take this medicine? °They need to know if you have any of these conditions: °-immune system problems °-progressive multifocal leukoencephalopathy (PML) °-an unusual or allergic reaction to natalizumab, other medicines, foods, dyes, or preservatives °-pregnant or trying to get pregnant °-breast-feeding °How should I use this medicine? °This medicine is for infusion into a vein. It is given by a health care professional in a hospital or clinic setting. °A special MedGuide will be given to you by the pharmacist with each prescription and refill. Be sure to read this information carefully each time. °Talk to your pediatrician regarding the use of this medicine in children. This medicine is not approved for use in children. °Overdosage: If you think you have taken too much of this medicine contact a poison control center or emergency room at once. °NOTE: This medicine is only for you. Do not share this medicine with others. °What if I miss a dose? °It is important not to miss your dose. Call your doctor or health care professional if you are unable to keep an appointment. °What may interact with this medicine? °-azathioprine °-cyclosporine °-interferon °-6-mercaptopurine °-methotrexate °-steroid medicines like prednisone or cortisone °-TNF-alpha inhibitors like adalimumab, etanercept, and infliximab °-vaccines °This list may not describe all possible interactions. Give your health care provider a list of all the medicines, herbs, non-prescription drugs, or dietary supplements you use. Also tell them if you smoke, drink  alcohol, or use illegal drugs. Some items may interact with your medicine. °What should I watch for while using this medicine? °Your condition will be monitored carefully while you are receiving this medicine. Visit your doctor for regular check ups. Tell your doctor or healthcare professional if your symptoms do not start to get better or if they get worse. °Stay away from people who are sick. Call your doctor or health care professional for advice if you get a fever, chills or sore throat, or other symptoms of a cold or flu. Do not treat yourself. °In some patients, this medicine may cause a serious brain infection that may cause death. If you have any problems seeing, thinking, speaking, walking, or standing, tell your doctor right away. If you cannot reach your doctor, get urgent medical care. °What side effects may I notice from receiving this medicine? °Side effects that you should report to your doctor or health care professional as soon as possible: °-allergic reactions like skin rash, itching or hives, swelling of the face, lips, or tongue °-breathing problems °-changes in vision °-chest pain °-dark urine °-depression, feelings of sadness °-dizziness °-general ill feeling or flu-like symptoms °-irregular, missed, or painful menstrual periods °-light-colored stools °-loss of appetite, nausea °-muscle weakness °-problems with balance, talking, or walking °-right upper belly pain °-unusually weak or tired °-yellowing of the eyes or skin °Side effects that usually do not require medical attention (report to your doctor or health care professional if they continue or are bothersome): °-aches, pains °-headache °-stomach upset °-tiredness °This list may not describe all possible side effects. Call your doctor for medical advice about side effects. You may report side effects to FDA at 1-800-FDA-1088. °Where should I   keep my medicine? °This drug is given in a hospital or clinic and will not be stored at  home. °NOTE: This sheet is a summary. It may not cover all possible information. If you have questions about this medicine, talk to your doctor, pharmacist, or health care provider. °© 2018 Elsevier/Gold Standard (2008-07-09 13:33:21) ° °

## 2017-04-23 DIAGNOSIS — Z0271 Encounter for disability determination: Secondary | ICD-10-CM

## 2017-05-02 ENCOUNTER — Encounter (HOSPITAL_COMMUNITY)
Admission: RE | Admit: 2017-05-02 | Discharge: 2017-05-02 | Disposition: A | Discharge status: Home / Self Care | Payer: Medicaid Other | Source: Ambulatory Visit | Attending: Neurology | Admitting: Neurology

## 2017-05-02 ENCOUNTER — Encounter (HOSPITAL_COMMUNITY): Payer: Self-pay

## 2017-05-02 DIAGNOSIS — G35 Multiple sclerosis: Secondary | ICD-10-CM | POA: Diagnosis not present

## 2017-05-02 MED ORDER — LORATADINE 10 MG PO TABS
10.0000 mg | ORAL_TABLET | ORAL | Status: DC
  Administered 2017-05-02: 10 mg via ORAL
  Filled 2017-05-02: qty 1

## 2017-05-02 MED ORDER — ACETAMINOPHEN 500 MG PO TABS
1000.0000 mg | ORAL_TABLET | ORAL | Status: DC
Start: 2017-05-02 — End: 2017-05-03
  Administered 2017-05-02: 1000 mg via ORAL
  Filled 2017-05-02: qty 2

## 2017-05-02 MED ORDER — SODIUM CHLORIDE 0.9 % IV SOLN
INTRAVENOUS | Status: DC
  Administered 2017-05-02: 13:00:00 via INTRAVENOUS

## 2017-05-02 MED ORDER — SODIUM CHLORIDE 0.9 % IV SOLN
300.0000 mg | INTRAVENOUS | Status: DC
  Administered 2017-05-02: 300 mg via INTRAVENOUS
  Filled 2017-05-02: qty 15

## 2017-05-02 NOTE — Progress Notes (Signed)
Patient stayed 15 min out of one hour recommended Tysabri observation time. Aware to call MD with any problems or questions.

## 2017-05-30 ENCOUNTER — Encounter (HOSPITAL_COMMUNITY)
Admission: RE | Admit: 2017-05-30 | Discharge: 2017-05-30 | Disposition: A | Discharge status: Home / Self Care | Payer: Medicaid Other | Source: Ambulatory Visit | Attending: Neurology | Admitting: Neurology

## 2017-05-30 ENCOUNTER — Encounter (HOSPITAL_COMMUNITY): Payer: Self-pay

## 2017-05-30 DIAGNOSIS — G35 Multiple sclerosis: Secondary | ICD-10-CM | POA: Diagnosis present

## 2017-05-30 MED ORDER — SODIUM CHLORIDE 0.9 % IV SOLN
300.0000 mg | INTRAVENOUS | Status: AC
  Administered 2017-05-30: 300 mg via INTRAVENOUS
  Filled 2017-05-30: qty 15

## 2017-05-30 MED ORDER — EPINEPHRINE 0.3 MG/0.3ML IJ SOAJ
0.3000 mg | INTRAMUSCULAR | Status: DC
  Filled 2017-05-30: qty 0.3

## 2017-05-30 MED ORDER — ACETAMINOPHEN 500 MG PO TABS
1000.0000 mg | ORAL_TABLET | ORAL | Status: AC
  Administered 2017-05-30: 1000 mg via ORAL
  Filled 2017-05-30: qty 2

## 2017-05-30 MED ORDER — LORATADINE 10 MG PO TABS
10.0000 mg | ORAL_TABLET | ORAL | Status: AC
  Administered 2017-05-30: 10 mg via ORAL
  Filled 2017-05-30: qty 1

## 2017-05-30 MED ORDER — METHYLPREDNISOLONE SODIUM SUCC 1000 MG IJ SOLR: 500.0000 mg | INTRAMUSCULAR | Status: DC

## 2017-05-30 MED ORDER — SODIUM CHLORIDE 0.9 % IV SOLN
INTRAVENOUS | Status: AC
  Administered 2017-05-30: 13:00:00 via INTRAVENOUS

## 2017-05-30 NOTE — Progress Notes (Signed)
Patient only stayed 15 min out of recommended one hour Tysabri observation time post infusion. Aware to call MD for any problems or questions.

## 2017-05-30 NOTE — Discharge Instructions (Signed)
°Tysabri °Natalizumab injection °What is this medicine? °NATALIZUMAB (na ta LIZ you mab) is used to treat relapsing multiple sclerosis. This drug is not a cure. It is also used to treat Crohn's disease. °This medicine may be used for other purposes; ask your health care provider or pharmacist if you have questions. °COMMON BRAND NAME(S): Tysabri °What should I tell my health care provider before I take this medicine? °They need to know if you have any of these conditions: °-immune system problems °-progressive multifocal leukoencephalopathy (PML) °-an unusual or allergic reaction to natalizumab, other medicines, foods, dyes, or preservatives °-pregnant or trying to get pregnant °-breast-feeding °How should I use this medicine? °This medicine is for infusion into a vein. It is given by a health care professional in a hospital or clinic setting. °A special MedGuide will be given to you by the pharmacist with each prescription and refill. Be sure to read this information carefully each time. °Talk to your pediatrician regarding the use of this medicine in children. This medicine is not approved for use in children. °Overdosage: If you think you have taken too much of this medicine contact a poison control center or emergency room at once. °NOTE: This medicine is only for you. Do not share this medicine with others. °What if I miss a dose? °It is important not to miss your dose. Call your doctor or health care professional if you are unable to keep an appointment. °What may interact with this medicine? °-azathioprine °-cyclosporine °-interferon °-6-mercaptopurine °-methotrexate °-steroid medicines like prednisone or cortisone °-TNF-alpha inhibitors like adalimumab, etanercept, and infliximab °-vaccines °This list may not describe all possible interactions. Give your health care provider a list of all the medicines, herbs, non-prescription drugs, or dietary supplements you use. Also tell them if you smoke, drink  alcohol, or use illegal drugs. Some items may interact with your medicine. °What should I watch for while using this medicine? °Your condition will be monitored carefully while you are receiving this medicine. Visit your doctor for regular check ups. Tell your doctor or healthcare professional if your symptoms do not start to get better or if they get worse. °Stay away from people who are sick. Call your doctor or health care professional for advice if you get a fever, chills or sore throat, or other symptoms of a cold or flu. Do not treat yourself. °In some patients, this medicine may cause a serious brain infection that may cause death. If you have any problems seeing, thinking, speaking, walking, or standing, tell your doctor right away. If you cannot reach your doctor, get urgent medical care. °What side effects may I notice from receiving this medicine? °Side effects that you should report to your doctor or health care professional as soon as possible: °-allergic reactions like skin rash, itching or hives, swelling of the face, lips, or tongue °-breathing problems °-changes in vision °-chest pain °-dark urine °-depression, feelings of sadness °-dizziness °-general ill feeling or flu-like symptoms °-irregular, missed, or painful menstrual periods °-light-colored stools °-loss of appetite, nausea °-muscle weakness °-problems with balance, talking, or walking °-right upper belly pain °-unusually weak or tired °-yellowing of the eyes or skin °Side effects that usually do not require medical attention (report to your doctor or health care professional if they continue or are bothersome): °-aches, pains °-headache °-stomach upset °-tiredness °This list may not describe all possible side effects. Call your doctor for medical advice about side effects. You may report side effects to FDA at 1-800-FDA-1088. °Where should I   keep my medicine? °This drug is given in a hospital or clinic and will not be stored at  home. °NOTE: This sheet is a summary. It may not cover all possible information. If you have questions about this medicine, talk to your doctor, pharmacist, or health care provider. °© 2018 Elsevier/Gold Standard (2008-07-09 13:33:21) ° °

## 2017-06-10 ENCOUNTER — Telehealth: Payer: Self-pay | Admitting: *Deleted

## 2017-06-10 NOTE — Telephone Encounter (Addendum)
Ocrevus PA approved through [employer] [phone]) - valid through 06/01/2019. Pt GX#[id number] O. CX#[id number].

## 2017-06-11 ENCOUNTER — Encounter: Payer: Self-pay | Admitting: Neurology

## 2017-06-11 ENCOUNTER — Ambulatory Visit: Payer: Medicaid Other | Admitting: Neurology

## 2017-06-11 VITALS — BP 178/110 | HR 71 | Ht 64.0 in | Wt 195.5 lb

## 2017-06-11 DIAGNOSIS — I159 Secondary hypertension, unspecified: Secondary | ICD-10-CM | POA: Insufficient documentation

## 2017-06-11 DIAGNOSIS — I10 Essential (primary) hypertension: Secondary | ICD-10-CM

## 2017-06-11 DIAGNOSIS — G35 Multiple sclerosis: Secondary | ICD-10-CM

## 2017-06-11 DIAGNOSIS — R269 Unspecified abnormalities of gait and mobility: Secondary | ICD-10-CM | POA: Diagnosis not present

## 2017-06-11 MED ORDER — LISINOPRIL 40 MG PO TABS: 20.0000 mg | ORAL_TABLET | Freq: Every day | ORAL | 11 refills | Status: DC

## 2017-06-11 NOTE — Progress Notes (Signed)
PATIENT: Julie Pope DOB: 27-Aug-1980  REASON FOR VISIT: follow up- Multiple sclerosis HISTORY FROM: patient  HISTORY OF PRESENT ILLNESS: HISTORY copied from Dr. Zannie Cove notes: She has history of hypertension, in May 18, 2010,while walking towards her car at the end of her working day, her legs give out underneath her, she fell face down on the concrete pavement at her Parking lot infront of [employer], knocked out her front teeth.    MRI of the brain,in May 19, 2010 without contrast, demonstrated diffuse white matter changes throughout the supratentorial and infratentorial region, with corpus callosum involvement, MRI scan of the cervical spine shows demyelinating enhancing plaques at C 3 on right and C 4-5 on left. consistent with MS   Repeat MRI scan of the brain in 2012, showed multiple brainstem, cerebellar, subcortical, corpus callosal and periventricular white matter lesions typical for demyelinating disease. Several T 1 black holes and enhancing lesions are seen bilaterally suggesting chronic and active disease. Significant progression and worsening compared with noncontrast MRI 05/19/10.  CSF showed elevated Ig G index, 9 ocb, extensive lab test for mimic were negative, including HIV, RPR, b12 b6, ACE lyme SSA, B, CBC, CMP, PEP, ana, copper, mild ele crp 6.5, and very low vit D<4.   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 In 05/17/13,  index value was 1.17. Positive with titer of 1.6 9 in June 07 2014  UPDATE Jan 5th 2016: She was on long term disability for 2-3 years, now she is in the process of applying for social disability, waiting for the  final decision. She still has mild gait difficulty, muscle spasm, urinary urgency, occasional bowel urgency.  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 she started baclofen 10 mg 3 times a day since January fifth 2016, mild drowsiness, does help her lower extremity spasticity.  UPDATE March 22nd 2016: We have reviewed MRI of brain Feb 2016, stable, but moderate burden of round and ovoid, periventricular, subcortical, pericallosal, juxtacortical, midbrain, pontine, cerebellar and cervicomedullary junction chronic demyelinating plaques. Many of these are hypointense on T1 views. No abnormal lesions are seen on post contrast views. Compared to MRI on 12/02/13, no significant change.  She continues to have occasional falling episode, mild unsteady gait.  UPDATE May 19th 2016: She came in for [facility] 9-21 screening visit, She has Mirena IUD insertion in May 6th 2016 at [facility], also using condom contraceptives since 2011, I have emphasized the importance of contraception, she voiced understanding  UPDATE January 22 2016: She is here with her mother and daughter at today's clinical visit, she has slow worsening gait abnormality, dragging her right foot more, also noticed more clumsiness at her right hand since Nov 2016,   She is still taking baclofen 10 mg twice a day, she is receiving Tysarbri infusion every month at [facility] stay, most recent infusion was January 19 2016, she tolerated the infusion well, no significant side effect noticed. She is still finding her disability,  UPDATE Oct 11th 2017: Laboratory evaluation showed negative anti-Tysarbri antibody,normal CMP, CBC, significantly low vitamin D level 9.4, normal CBC.  She has received IV Solu-Medrol 3 days in September 2017 without  significant improvement, We have personally reviewed and compared to MRI scans, most recent September 2017, multiple T2/FLAIR hyperintensity foci within the brain stem, cerebellum, middle cerebellar peduncle, spinal cord periventricular, chest of cortical and deep white matter, no contrast enhancement, no significant change compared to 2016.  UPDATE Mar 13 2017: She has loose bowels, gait abnormality, gradually increase so she fell sometimes, her blood pressure fluctuate, she is receiving Tysarbri every month,  Right leg dragging more.  JC-virus antibody was 2.88 positive, titer was trending up.  We have personally reviewed MRI of the brain with and without contrast in August in comparison to previous MRIs in 2017, extensive MS lesions, no contrast enhancement, no significant change  UPDATE Jun 11 2017: She continues to struggle with worsening gait abnormality, right side has more difficulty, her disability is denied again,  She is approved for ocrelizumab, last Tysabri infusion was May 30, 2017.  Reviewed laboratory evaluation normal or negative hepatitis B, C, TSH, CMP,  Cbc, TB   REVIEW OF SYSTEMS: Out of a complete 14 system review of symptoms, the patient complains only of the following symptoms, and all other reviewed systems are negative.  Blurred vision, muscle cramps, facial drooping, headache  ALLERGIES: No Known Allergies  HOME MEDICATIONS: Outpatient Medications Prior to Visit  Medication Sig Dispense Refill  . atenolol (TENORMIN) 50 MG tablet Take 1 tablet (50 mg total) by mouth daily. 90 tablet 4  . baclofen (LIORESAL) 10 MG tablet One tab po tid 270 each 4  . calcium citrate (CALCITRATE - DOSED IN MG ELEMENTAL CALCIUM) 950 MG tablet Take 1 tablet by mouth daily.      . Cholecalciferol (VITAMIN D3) 1000 units CAPS Take by mouth daily.    Marland Kitchen ibuprofen (ADVIL,MOTRIN) 800 MG tablet Take 800 mg by mouth as needed.    Marland Kitchen lisinopril (PRINIVIL,ZESTRIL) 20 MG tablet Take 1  tablet (20 mg total) by mouth daily. 30 tablet 11  . methocarbamol (ROBAXIN) 500 MG tablet Take 500 mg by mouth as needed.    Marland Kitchen ocrelizumab (OCREVUS) 300 MG/10ML injection Infuse 300mg  day one then 300mg  on day fifteen.  Then 600mg  IV every 6 months. 600 mL 1   No facility-administered medications prior to visit.     PAST MEDICAL HISTORY: Past Medical History:  Diagnosis Date  . Abnormality of gait   . Hypertension   . MS (multiple sclerosis) (HCC)   . Other nonspecific abnormal result of function study of brain and central nervous system   . Personal history of fall     PAST SURGICAL HISTORY: Past Surgical History:  Procedure Laterality Date  . CHOLECYSTECTOMY    . GALLBLADDER SURGERY      FAMILY HISTORY: Family History  Problem Relation Age of Onset  . Cancer Maternal Grandmother   . High blood pressure Unknown        Runs in both sides of the family    SOCIAL HISTORY: Social History   Socioeconomic History  . Marital status: Single    Spouse name: Not on file  . Number of children: 2  . Years of education: 16  . Highest education level: Not on file  Social Needs  . Financial resource strain: Not on file  . Food insecurity - worry: Not on file  . Food insecurity -  inability: Not on file  . Transportation needs - medical: Not on file  . Transportation needs - non-medical: Not on file  Occupational History  . Occupation: FILE Marine scientist: [employer]  Tobacco Use  . Smoking status: Never Smoker  . Smokeless tobacco: Never Used  Substance and Sexual Activity  . Alcohol use: No  . Drug use: No  . Sexual activity: Not on file  Other Topics Concern  . Not on file  Social History Narrative   Patient has a high school education and two children. Patient is not currently working.   Patient is right-handed.   Patient drinks three cups of tea daily and two cans of soda daily.      PHYSICAL EXAM  Vitals:   06/11/17 1512  BP: (!) 178/110  Pulse:  71  Weight: 195 lb 8 oz (88.7 kg)  Height: 5\' 4"  (1.626 m)   Body mass index is 33.56 kg/m.  Generalized: Well developed, in no acute distress   Neurological examination  Mentation: Alert oriented to time, place, history taking. Follows all commands speech and language fluent Cranial nerve II-XII: Pupils were equal round reactive to light. Extraocular movements were full, visual field were full on confrontational test. Facial sensation and strength were normal. Uvula tongue midline. Head turning and shoulder shrug  were normal and symmetric. Motor: The motor testing reveals 5 over 5 strength of all 4 extremities. Good symmetric motor tone is noted throughout.  Sensory: Sensory testing is intact to soft touch on all 4 extremities. No evidence of extinction is noted.  Coordination: Cerebellar testing reveals good finger-nose-finger and heel-to-shin bilaterally.  Gait and station:  Stiff, cautious, dragging her right leg Reflexes: Deep tendon reflexes areNormal and normal bilaterally in the upper extremity. Hyperreflexic in the lower extremities   DIAGNOSTIC DATA (LABS, IMAGING, TESTING) - I reviewed patient records, labs, notes, testing and imaging myself where available.  Lab Results  Component Value Date   WBC 9.1 03/13/2017   HGB 11.1 03/13/2017   HCT 35.2 03/13/2017   MCV 84 03/13/2017   PLT 357 09/11/2016      Component Value Date/Time   NA 140 03/13/2017 1114   K 4.8 03/13/2017 1114   CL 101 03/13/2017 1114   CO2 23 03/13/2017 1114   GLUCOSE 78 03/13/2017 1114   GLUCOSE 84 05/18/2010 1900   BUN 12 03/13/2017 1114   CREATININE 0.91 03/13/2017 1114   CALCIUM 9.1 03/13/2017 1114   PROT 7.6 03/13/2017 1114   ALBUMIN 4.4 03/13/2017 1114   AST 13 03/13/2017 1114   ALT 7 03/13/2017 1114   ALKPHOS 102 03/13/2017 1114   BILITOT 0.4 03/13/2017 1114   GFRNONAA 80 03/13/2017 1114   GFRAA 93 03/13/2017 1114   No results found for: CHOL, HDL, LDLCALC, LDLDIRECT, TRIG,  CHOLHDL No results found for: ZOXW9U No results found for: VITAMINB12 Lab Results  Component Value Date   TSH 0.959 03/13/2017      ASSESSMENT AND PLAN 37 y.o. year old female   Relapsing remitting multiple sclerosis Slow worsening gait abnormality Slow trending up of JC virus titer, most recent in August 2018 was 2.88  She received Tysarbri treatment since 2012,  With slow trending up of JC virus titer, this has put at a higher risk group for PML  She also has slow worsening gait abnormality  start Ocreluzumab, 4 weeks after May 30 2017, last Tysarbri infusion.  Worsening hypertension  Increase Lisinopril to 40mg  daily.  Levert Feinstein, M.D. Ph.D.  [facility] [address] for [address] [place], [place] [address] Phone: [phone] Fax:      [phone]

## 2017-06-14 LAB — HEPATITIS B CORE ANTIBODY, TOTAL: HEP B C TOTAL AB: NEGATIVE

## 2017-06-14 LAB — COMPREHENSIVE METABOLIC PANEL
A/G RATIO: 1.5 (ref 1.2–2.2)
ALBUMIN: 4.4 g/dL (ref 3.5–5.5)
ALT: 7 IU/L (ref 0–32)
AST: 15 IU/L (ref 0–40)
Alkaline Phosphatase: 86 IU/L (ref 39–117)
BILIRUBIN TOTAL: 0.4 mg/dL (ref 0.0–1.2)
BUN / CREAT RATIO: 13 (ref 9–23)
BUN: 10 mg/dL (ref 6–20)
CALCIUM: 9.5 mg/dL (ref 8.7–10.2)
CHLORIDE: 102 mmol/L (ref 96–106)
CO2: 25 mmol/L (ref 20–29)
Creatinine, Ser: 0.8 mg/dL (ref 0.57–1.00)
GFR, EST AFRICAN AMERICAN: 108 mL/min/{1.73_m2} (ref >59)
GFR, EST NON AFRICAN AMERICAN: 94 mL/min/{1.73_m2} (ref >59)
GLOBULIN, TOTAL: 2.9 g/dL (ref 1.5–4.5)
Glucose: 82 mg/dL (ref 65–99)
POTASSIUM: 4.2 mmol/L (ref 3.5–5.2)
Sodium: 140 mmol/L (ref 134–144)
TOTAL PROTEIN: 7.3 g/dL (ref 6.0–8.5)

## 2017-06-14 LAB — QUANTIFERON-TB GOLD PLUS
QUANTIFERON TB2 AG VALUE: 0.04 [IU]/mL
QUANTIFERON-TB GOLD PLUS: NEGATIVE
QuantiFERON Nil Value: 0.04 IU/mL
QuantiFERON TB1 Ag Value: 0.04 IU/mL

## 2017-06-14 LAB — CBC
HEMATOCRIT: 35.5 % (ref 34.0–46.6)
HEMOGLOBIN: 11.9 g/dL (ref 11.1–15.9)
MCH: 27.9 pg (ref 26.6–33.0)
MCHC: 33.5 g/dL (ref 31.5–35.7)
MCV: 83 fL (ref 79–97)
Platelets: 363 10*3/uL (ref 150–379)
RBC: 4.26 x10E6/uL (ref 3.77–5.28)
RDW: 15.2 % (ref 12.3–15.4)
WBC: 11.2 10*3/uL — ABNORMAL HIGH (ref 3.4–10.8)

## 2017-06-14 LAB — TSH: TSH: 0.815 u[IU]/mL (ref 0.450–4.500)

## 2017-06-14 LAB — HEPATITIS C ANTIBODY

## 2017-06-14 LAB — RPR: RPR: NONREACTIVE

## 2017-06-14 LAB — HIV ANTIBODY (ROUTINE TESTING W REFLEX): HIV SCREEN 4TH GENERATION: NONREACTIVE

## 2017-06-14 LAB — HEPATITIS B SURFACE ANTIGEN: Hepatitis B Surface Ag: NEGATIVE

## 2017-06-14 LAB — VARICELLA ZOSTER ANTIBODY, IGG: Varicella zoster IgG: 2910 index (ref >165)

## 2017-06-14 LAB — HEPATITIS B SURFACE ANTIBODY,QUALITATIVE: Hep B Surface Ab, Qual: NONREACTIVE

## 2017-06-16 ENCOUNTER — Telehealth: Payer: Self-pay | Admitting: *Deleted

## 2017-06-16 NOTE — Telephone Encounter (Signed)
Spoke to patient - denies signs and symptoms of any type of infection.  She is aware of lab results.

## 2017-06-16 NOTE — Telephone Encounter (Signed)
-----   Message from Levert Feinstein, MD sent at 06/16/2017  1:37 PM EST ----- Please call patient for laboratory evaluation showed mild elevated WBC, ask her to see if she has any signs of infection, fever, UTI, upper respiratory signs, if not, we will not have repeat test, rest of the laboratories were normal

## 2017-06-25 ENCOUNTER — Other Ambulatory Visit: Payer: Self-pay | Admitting: Neurology

## 2017-06-27 ENCOUNTER — Telehealth: Payer: Self-pay | Admitting: Neurology

## 2017-06-27 NOTE — Telephone Encounter (Signed)
error 

## 2017-06-27 NOTE — Telephone Encounter (Addendum)
[facility] Stay [phone]  (f) [phone] pt's 1 st infusion is 07/02/17, she rec'd the order but the directions read 600mg  1st, 600mg  for the 15th day but it suppose to be split in half..300mg  1st, 300mg  15th and 600mg  every 6 mths. Please correct order and refax asap. Thank you!

## 2017-06-27 NOTE — Telephone Encounter (Signed)
Michelle:   Please change the order to   Ocrevus 300mg  then 300mg  2 weeks later.  Then 600mg  every 6 months

## 2017-06-30 NOTE — Telephone Encounter (Signed)
Orders faxed per instructions below.

## 2017-07-02 ENCOUNTER — Encounter (HOSPITAL_COMMUNITY): Payer: Medicaid Other

## 2017-07-16 ENCOUNTER — Encounter (HOSPITAL_COMMUNITY): Payer: Self-pay

## 2017-07-16 ENCOUNTER — Encounter (HOSPITAL_COMMUNITY)
Admission: RE | Admit: 2017-07-16 | Discharge: 2017-07-16 | Disposition: A | Discharge status: Home / Self Care | Payer: Medicaid Other | Source: Ambulatory Visit | Attending: Neurology | Admitting: Neurology

## 2017-07-16 DIAGNOSIS — G35 Multiple sclerosis: Secondary | ICD-10-CM | POA: Insufficient documentation

## 2017-07-16 MED ORDER — SODIUM CHLORIDE 0.9 % IV SOLN
INTRAVENOUS | Status: DC
  Administered 2017-07-16: 08:00:00 via INTRAVENOUS

## 2017-07-16 MED ORDER — DIPHENHYDRAMINE HCL 25 MG PO CAPS
50.0000 mg | ORAL_CAPSULE | ORAL | Status: DC
  Administered 2017-07-16: 50 mg via ORAL
  Filled 2017-07-16: qty 2

## 2017-07-16 MED ORDER — SODIUM CHLORIDE 0.9 % IV SOLN
500.0000 mg | INTRAVENOUS | Status: DC
  Administered 2017-07-16: 500 mg via INTRAVENOUS
  Filled 2017-07-16: qty 4

## 2017-07-16 MED ORDER — SODIUM CHLORIDE 0.9 % IV SOLN
300.0000 mg | INTRAVENOUS | Status: DC
  Administered 2017-07-16: 300 mg via INTRAVENOUS
  Filled 2017-07-16: qty 10

## 2017-07-16 NOTE — Progress Notes (Signed)
Patient's 1st infusion of Ocrevus, patient tolerated without any problems, patient to return in 2 weeks.

## 2017-07-16 NOTE — Discharge Instructions (Signed)
Ocrelizumab injection What is this medicine? OCRELIZUMAB (ok re LIZ ue mab) treats multiple sclerosis. It helps to decrease the number of multiple sclerosis relapses. It is not a cure. This medicine may be used for other purposes; ask your health care provider or pharmacist if you have questions. COMMON BRAND NAME(S): OCREVUS What should I tell my health care provider before I take this medicine? They need to know if you have any of these conditions: -cancer -hepatitis B infection -other infection (especially a virus infection such as chickenpox, cold sores, or herpes) -an unusual or allergic reaction to ocrelizumab, other medicines, foods, dyes or preservatives -pregnant or trying to get pregnant -breast-feeding How should I use this medicine? This medicine is for infusion into a vein. It is given by a health care professional in a hospital or clinic setting. Talk to your pediatrician regarding the use of this medicine in children. Special care may be needed. Overdosage: If you think you have taken too much of this medicine contact a poison control center or emergency room at once. NOTE: This medicine is only for you. Do not share this medicine with others. What if I miss a dose? Keep appointments for follow-up doses as directed. It is important not to miss your dose. Call your doctor or health care professional if you are unable to keep an appointment. What may interact with this medicine? -alemtuzumab -daclizumab -dimethyl fumarate -fingolimod -glatiramer -interferon beta -live virus vaccines -mitoxantrone -natalizumab -peginterferon beta -rituximab -steroid medicines like prednisone or cortisone -teriflunomide This list may not describe all possible interactions. Give your health care provider a list of all the medicines, herbs, non-prescription drugs, or dietary supplements you use. Also tell them if you smoke, drink alcohol, or use illegal drugs. Some items may interact with  your medicine. What should I watch for while using this medicine? Tell your doctor or healthcare professional if your symptoms do not start to get better or if they get worse. This medicine can cause serious allergic reactions. To reduce your risk you may need to take medicine before treatment with this medicine. Take your medicine as directed. Women should inform their doctor if they wish to become pregnant or think they might be pregnant. There is a potential for serious side effects to an unborn child. Talk to your health care professional or pharmacist for more information. Female patients should use effective birth control methods while receiving this medicine and for 6 months after the last dose. Call your doctor or health care professional for advice if you get a fever, chills or sore throat, or other symptoms of a cold or flu. Do not treat yourself. This drug decreases your body's ability to fight infections. Try to avoid being around people who are sick. If you have a hepatitis B infection or a history of a hepatitis B infection, talk to your doctor. The symptoms of hepatitis B may get worse if you take this medicine. In some patients, this medicine may cause a serious brain infection that may cause death. If you have any problems seeing, thinking, speaking, walking, or standing, tell your doctor right away. If you cannot reach your doctor, urgently seek other source of medical care. This medicine can decrease the response to a vaccine. If you need to get vaccinated, tell your healthcare professional if you have received this medicine. Extra booster doses may be needed. Talk to your doctor to see if a different vaccination schedule is needed. Talk to your doctor about your risk of cancer.   You may be more at risk for certain types of cancers if you take this medicine. What side effects may I notice from receiving this medicine? Side effects that you should report to your doctor or health care  professional as soon as possible: -allergic reactions like skin rash, itching or hives, swelling of the face, lips, or tongue -breathing problems -facial flushing -fast, irregular heartbeat -lump or soreness in the breast -signs and symptoms of herpes such as cold sore, shingles, or genital sores -signs and symptoms of infection like fever or chills, cough, sore throat, pain or trouble passing urine -signs and symptoms of low blood pressure like dizziness; feeling faint or lightheaded, falls; unusually weak or tired -signs and symptoms of progressive multifocal leukoencephalopathy (PML) like changes in vision; clumsiness; confusion; personality changes; weakness on one side of the body -swelling of the ankles, feet, hands Side effects that usually do not require medical attention (report these to your doctor or health care professional if they continue or are bothersome): -back pain -depressed mood -diarrhea -pain, redness, or irritation at site where injected This list may not describe all possible side effects. Call your doctor for medical advice about side effects. You may report side effects to FDA at 1-800-FDA-1088. Where should I keep my medicine? This drug is given in a hospital or clinic and will not be stored at home. NOTE: This sheet is a summary. It may not cover all possible information. If you have questions about this medicine, talk to your doctor, pharmacist, or health care provider.  2018 Elsevier/Gold Standard (2015-09-05 09:40:25)  

## 2017-07-30 ENCOUNTER — Encounter (HOSPITAL_COMMUNITY): Payer: Self-pay

## 2017-07-30 ENCOUNTER — Encounter (HOSPITAL_COMMUNITY)
Admission: RE | Admit: 2017-07-30 | Discharge: 2017-07-30 | Disposition: A | Discharge status: Home / Self Care | Payer: Medicaid Other | Source: Ambulatory Visit | Attending: Neurology | Admitting: Neurology

## 2017-07-30 DIAGNOSIS — G35 Multiple sclerosis: Secondary | ICD-10-CM | POA: Diagnosis not present

## 2017-07-30 MED ORDER — DIPHENHYDRAMINE HCL 25 MG PO CAPS
50.0000 mg | ORAL_CAPSULE | ORAL | Status: AC
  Administered 2017-07-30: 50 mg via ORAL
  Filled 2017-07-30: qty 2

## 2017-07-30 MED ORDER — SODIUM CHLORIDE 0.9 % IV SOLN
INTRAVENOUS | Status: AC
  Administered 2017-07-30: 08:00:00 via INTRAVENOUS

## 2017-07-30 MED ORDER — SODIUM CHLORIDE 0.9 % IV SOLN
300.0000 mg | INTRAVENOUS | Status: AC
  Administered 2017-07-30: 300 mg via INTRAVENOUS
  Filled 2017-07-30: qty 10

## 2017-07-30 MED ORDER — SODIUM CHLORIDE 0.9 % IV SOLN
500.0000 mg | INTRAVENOUS | Status: AC
  Administered 2017-07-30: 500 mg via INTRAVENOUS
  Filled 2017-07-30: qty 4

## 2017-07-30 NOTE — Discharge Instructions (Signed)
Ocrelizumab injection What is this medicine? OCRELIZUMAB (ok re LIZ ue mab) treats multiple sclerosis. It helps to decrease the number of multiple sclerosis relapses. It is not a cure. This medicine may be used for other purposes; ask your health care provider or pharmacist if you have questions. COMMON BRAND NAME(S): OCREVUS What should I tell my health care provider before I take this medicine? They need to know if you have any of these conditions: -cancer -hepatitis B infection -other infection (especially a virus infection such as chickenpox, cold sores, or herpes) -an unusual or allergic reaction to ocrelizumab, other medicines, foods, dyes or preservatives -pregnant or trying to get pregnant -breast-feeding How should I use this medicine? This medicine is for infusion into a vein. It is given by a health care professional in a hospital or clinic setting. Talk to your pediatrician regarding the use of this medicine in children. Special care may be needed. Overdosage: If you think you have taken too much of this medicine contact a poison control center or emergency room at once. NOTE: This medicine is only for you. Do not share this medicine with others. What if I miss a dose? Keep appointments for follow-up doses as directed. It is important not to miss your dose. Call your doctor or health care professional if you are unable to keep an appointment. What may interact with this medicine? -alemtuzumab -daclizumab -dimethyl fumarate -fingolimod -glatiramer -interferon beta -live virus vaccines -mitoxantrone -natalizumab -peginterferon beta -rituximab -steroid medicines like prednisone or cortisone -teriflunomide This list may not describe all possible interactions. Give your health care provider a list of all the medicines, herbs, non-prescription drugs, or dietary supplements you use. Also tell them if you smoke, drink alcohol, or use illegal drugs. Some items may interact with  your medicine. What should I watch for while using this medicine? Tell your doctor or healthcare professional if your symptoms do not start to get better or if they get worse. This medicine can cause serious allergic reactions. To reduce your risk you may need to take medicine before treatment with this medicine. Take your medicine as directed. Women should inform their doctor if they wish to become pregnant or think they might be pregnant. There is a potential for serious side effects to an unborn child. Talk to your health care professional or pharmacist for more information. Female patients should use effective birth control methods while receiving this medicine and for 6 months after the last dose. Call your doctor or health care professional for advice if you get a fever, chills or sore throat, or other symptoms of a cold or flu. Do not treat yourself. This drug decreases your body's ability to fight infections. Try to avoid being around people who are sick. If you have a hepatitis B infection or a history of a hepatitis B infection, talk to your doctor. The symptoms of hepatitis B may get worse if you take this medicine. In some patients, this medicine may cause a serious brain infection that may cause death. If you have any problems seeing, thinking, speaking, walking, or standing, tell your doctor right away. If you cannot reach your doctor, urgently seek other source of medical care. This medicine can decrease the response to a vaccine. If you need to get vaccinated, tell your healthcare professional if you have received this medicine. Extra booster doses may be needed. Talk to your doctor to see if a different vaccination schedule is needed. Talk to your doctor about your risk of cancer.   You may be more at risk for certain types of cancers if you take this medicine. What side effects may I notice from receiving this medicine? Side effects that you should report to your doctor or health care  professional as soon as possible: -allergic reactions like skin rash, itching or hives, swelling of the face, lips, or tongue -breathing problems -facial flushing -fast, irregular heartbeat -lump or soreness in the breast -signs and symptoms of herpes such as cold sore, shingles, or genital sores -signs and symptoms of infection like fever or chills, cough, sore throat, pain or trouble passing urine -signs and symptoms of low blood pressure like dizziness; feeling faint or lightheaded, falls; unusually weak or tired -signs and symptoms of progressive multifocal leukoencephalopathy (PML) like changes in vision; clumsiness; confusion; personality changes; weakness on one side of the body -swelling of the ankles, feet, hands Side effects that usually do not require medical attention (report these to your doctor or health care professional if they continue or are bothersome): -back pain -depressed mood -diarrhea -pain, redness, or irritation at site where injected This list may not describe all possible side effects. Call your doctor for medical advice about side effects. You may report side effects to FDA at 1-800-FDA-1088. Where should I keep my medicine? This drug is given in a hospital or clinic and will not be stored at home. NOTE: This sheet is a summary. It may not cover all possible information. If you have questions about this medicine, talk to your doctor, pharmacist, or health care provider.  2018 Elsevier/Gold Standard (2015-09-05 09:40:25)  

## 2017-08-01 ENCOUNTER — Encounter (HOSPITAL_COMMUNITY): Payer: Medicaid Other

## 2017-08-29 ENCOUNTER — Encounter (HOSPITAL_COMMUNITY): Payer: Medicaid Other

## 2017-09-02 ENCOUNTER — Encounter (HOSPITAL_COMMUNITY): Payer: Medicaid Other

## 2017-09-09 ENCOUNTER — Encounter: Payer: Self-pay | Admitting: *Deleted

## 2017-09-11 ENCOUNTER — Ambulatory Visit: Payer: Medicaid Other | Admitting: Nurse Practitioner

## 2017-09-23 ENCOUNTER — Telehealth: Payer: Self-pay | Admitting: Neurology

## 2017-09-23 NOTE — Telephone Encounter (Signed)
Pt is wanting to know when she is due for next ocrevus infusion. Please call to advise

## 2017-09-23 NOTE — Telephone Encounter (Signed)
Patient needed to confirm her next Ocrevus appt.  First half of initial infusion on 07/16/17 - second half of initial infusion on 07/30/17.  Next full dose of Ocrevus scheduled for 01/30/18 at 8am [facility]).  Pt is aware of appt time and location.

## 2017-12-10 ENCOUNTER — Telehealth: Payer: Self-pay | Admitting: Adult Health

## 2017-12-10 ENCOUNTER — Ambulatory Visit (INDEPENDENT_AMBULATORY_CARE_PROVIDER_SITE_OTHER): Payer: Medicaid Other | Admitting: Adult Health

## 2017-12-10 ENCOUNTER — Encounter: Payer: Self-pay | Admitting: Adult Health

## 2017-12-10 VITALS — BP 138/92 | HR 76 | Ht 64.0 in | Wt 187.0 lb

## 2017-12-10 DIAGNOSIS — F329 Major depressive disorder, single episode, unspecified: Secondary | ICD-10-CM | POA: Diagnosis not present

## 2017-12-10 DIAGNOSIS — G35 Multiple sclerosis: Secondary | ICD-10-CM

## 2017-12-10 DIAGNOSIS — R269 Unspecified abnormalities of gait and mobility: Secondary | ICD-10-CM | POA: Diagnosis not present

## 2017-12-10 DIAGNOSIS — F32A Depression, unspecified: Secondary | ICD-10-CM

## 2017-12-10 NOTE — Patient Instructions (Addendum)
Your Plan:  Continue Ocrevus Blood work today MRI brain If your symptoms worsen or you develop new symptoms please let us know.   Thank you for coming to see us at [facility]. I hope we have been able to provide you high quality care today.  You may receive a patient satisfaction survey over the next few weeks. We would appreciate your feedback and comments so that we may continue to improve ourselves and the health of our patients.  

## 2017-12-10 NOTE — Telephone Encounter (Signed)
Medicaid order sent to GI. They will obtain the auth and will reach out to the pt to schedule.  °

## 2017-12-10 NOTE — Progress Notes (Signed)
I have reviewed and agreed above plan. 

## 2017-12-10 NOTE — Progress Notes (Signed)
PATIENT: Julie Pope DOB: 09/10/1980  REASON FOR VISIT: follow up HISTORY FROM: patient  HISTORY OF PRESENT ILLNESS: HISTORY copied from Dr. Zannie Cove notes: She has history of hypertension, in May 18, 2010,while walking towards her car at the end of her working day, her legs give out underneath her, she fell face down on the concrete pavement at her Parking lot infront of [employer], knocked out her front teeth.   MRI of the brain,in May 19, 2010 without contrast, demonstrated diffuse white matter changes throughout the supratentorial and infratentorial region, with corpus callosum involvement, MRI scan of the cervical spine shows demyelinating enhancing plaques at C 3 on right and C 4-5 on left. consistent with MS   Repeat MRI scan of the brain in 2012, showed multiple brainstem, cerebellar, subcortical, corpus callosal and periventricular white matter lesions typical for demyelinating disease. Several T 1 black holes and enhancing lesions are seen bilaterally suggesting chronic and active disease. Significant progression and worsening compared with noncontrast MRI 05/19/10.  CSF showed elevated Ig G index, 9 ocb,extensive lab test for mimic were negative, including HIV, RPR, b12 b6, ACE lyme SSA, B, CBC, CMP, PEP, ana, copper, mild ele crp 6.5, and very low vit D<4.   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 In 05/17/13, index value was 1.17. Positive with titer of 1.6 9 in June 07 2014  UPDATE Jan 5th 2016: She was on long term disability for 2-3 years, now she is in the process of applying for social disability, waiting for the final  decision. She still has mild gait difficulty, muscle spasm, urinary urgency, occasional bowel urgency.  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 she started baclofen 10 mg 3 times a day since January fifth 2016, mild drowsiness, does help her lower extremity spasticity.  UPDATE March 22nd 2016: We have reviewed MRI of brain Feb 2016, stable, but moderate burden of round and ovoid, periventricular, subcortical, pericallosal, juxtacortical, midbrain, pontine, cerebellar and cervicomedullary junction chronic demyelinating plaques. Many of these are hypointense on T1 views. No abnormal lesions are seen on post contrast views. Compared to MRI on 12/02/13, no significant change.  She continues to have occasional falling episode, mild unsteady gait.  UPDATE May 19th 2016: She came in for [facility] 9-21 screening visit, She has Mirena IUD insertion in May 6th 2016 at [facility], also using condom contraceptives since 2011, I have emphasized the importance of contraception, she voiced understanding  UPDATE January 22 2016: She is here with her mother and daughter at today's clinical visit, she has slow worsening gait abnormality, dragging her right foot more, also noticed more clumsiness at her right hand since Nov 2016,   She is still taking baclofen 10 mg twice a day, she is receiving Tysarbri infusion every month at [facility] stay, most recent infusion was January 19 2016, she tolerated the infusion well, no significant side effect noticed. She is still finding her disability,  UPDATE Oct 11th 2017: Laboratory  evaluation showed negative anti-Tysarbri antibody,normal CMP, CBC, significantly low vitamin D level 9.4, normal CBC.  She has received IV Solu-Medrol 3 days in September 2017 without  significant improvement, We have personally reviewed and compared to MRI scans, most recent September 2017, multiple T2/FLAIR hyperintensity foci within the brain stem, cerebellum, middle cerebellar peduncle, spinal cord periventricular, chest of cortical and deep white matter, no contrast enhancement, no significant change compared to 2016.  UPDATE Mar 13 2017: She has loose bowels, gait abnormality, gradually increase so she fell sometimes, her blood pressure fluctuate, she is receiving Tysarbri every month,  Right leg dragging more.  JC-virus antibody was 2.88 positive, titer was trending up.  We have personally reviewed MRI of the brain with and without contrast in August in comparison to previous MRIs in 2017, extensive MS lesions, no contrast enhancement, no significant change  UPDATE Jun 11 2017: She continues to struggle with worsening gait abnormality, right side has more difficulty, her disability is denied again,  She is approved for ocrelizumab, last Tysabri infusion was May 30, 2017.  Reviewed laboratory evaluation normal or negative hepatitis B, C, TSH, CMP,  Cbc, TB   Today 12/10/17: Julie Pope is a 37 year old female with a history of multiple sclerosis.  She returns today for follow-up.  She is currently on Ocrevus and tolerating it well.  Her next infusion will be in August.  She continues to experience weakness primarily on the right side.  States occasionally she will wake up with muscle cramps.  She is on Robaxin.  She denies any changes with bowel or bladder.  She does report urinary incontinence at times.  She states that she may have 2-3 episodes a week.  No change in her vision.  She does report  depression.  However she does not want to be on medication at this time.  Her mom reports that she has good days and bad days.  Her last fall was on July 4.  She does drag the right foot but this is been ongoing.  Patient reports that she suffers with a lot of fatigue.   She states that a lot of times she does not have the energy to complete task.  She states that she tries to walk with her mom but is unable to walk for long distances without having to rest.  She states that she also misses out on doing things with her daughter.  He states that her daughter has a dance competition coming up but she is unable to go because of the long ride in the car.  She also reports that she has trouble thinking clearly.  Reports forgetfulness at times.  She returns today for evaluation.    REVIEW OF SYSTEMS: Out of a complete 14 system review of symptoms, the patient complains only of the following symptoms, and all other reviewed systems are negative.  Chills, restless leg, muscle cramps, walking difficulty, weakness, numbness, incontinence of bladder  ALLERGIES: No Known Allergies  HOME MEDICATIONS: Outpatient Medications Prior to Visit  Medication Sig Dispense Refill  . atenolol (TENORMIN) 50 MG tablet Take 1 tablet (50 mg total) by mouth daily. 90 tablet 4  . calcium citrate (CALCITRATE - DOSED IN MG ELEMENTAL CALCIUM) 950 MG tablet Take 1 tablet by mouth daily.      . Cholecalciferol (VITAMIN D3) 1000 units CAPS Take by mouth daily.    Marland Kitchen ibuprofen (ADVIL,MOTRIN) 800 MG tablet Take 800 mg by mouth as needed.    Marland Kitchen lisinopril (PRINIVIL,ZESTRIL)  40 MG tablet Take 0.5 tablets (20 mg total) by mouth daily. 30 tablet 11  . methocarbamol (ROBAXIN) 500 MG tablet Take 500 mg by mouth as needed.    Marland Kitchen ocrelizumab 600 mg in sodium chloride 0.9 % 500 mL Inject 600 mg into the vein every 6 (six) months.    . OCREVUS 300 MG/10ML injection INFUSE 600MG  ON DAY 1, REPEAT ON DAY 15 20 mL 0  . baclofen (LIORESAL) 10 MG tablet One tab po tid 270 each 4   No facility-administered medications prior to visit.     PAST MEDICAL HISTORY: Past Medical History:  Diagnosis Date  . Abnormality of gait   . Hypertension   . MS (multiple sclerosis) (HCC)   . Other nonspecific abnormal result  of function study of brain and central nervous system   . Personal history of fall     PAST SURGICAL HISTORY: Past Surgical History:  Procedure Laterality Date  . CHOLECYSTECTOMY    . GALLBLADDER SURGERY      FAMILY HISTORY: Family History  Problem Relation Age of Onset  . Cancer Maternal Grandmother   . High blood pressure Unknown        Runs in both sides of the family    SOCIAL HISTORY: Social History   Socioeconomic History  . Marital status: Single    Spouse name: Not on file  . Number of children: 2  . Years of education: 56  . Highest education level: Not on file  Occupational History  . Occupation: FILE Marine scientist: [employer]  Social Needs  . Financial resource strain: Not on file  . Food insecurity:    Worry: Not on file    Inability: Not on file  . Transportation needs:    Medical: Not on file    Non-medical: Not on file  Tobacco Use  . Smoking status: Never Smoker  . Smokeless tobacco: Never Used  Substance and Sexual Activity  . Alcohol use: No  . Drug use: No  . Sexual activity: Not on file  Lifestyle  . Physical activity:    Days per week: Not on file    Minutes per session: Not on file  . Stress: Not on file  Relationships  . Social connections:    Talks on phone: Not on file    Gets together: Not on file    Attends religious service: Not on file    Active member of club or organization: Not on file    Attends meetings of clubs or organizations: Not on file    Relationship status: Not on file  . Intimate partner violence:    Fear of current or ex partner: Not on file    Emotionally abused: Not on file    Physically abused: Not on file    Forced sexual activity: Not on file  Other Topics Concern  . Not on file  Social History Narrative   Patient has a high school education and two children. Patient is not currently working.   Patient is right-handed.   Patient drinks three cups of tea daily and two cans of soda daily.       PHYSICAL EXAM  Vitals:   12/10/17 1313  BP: (!) 138/92  Pulse: 76  Weight: 187 lb (84.8 kg)  Height: 5\' 4"  (1.626 m)   Body mass index is 32.1 kg/m.  Generalized: Well developed, in no acute distress   Neurological examination  Mentation: Alert oriented to time, place,  history taking. Follows all commands speech and language fluent Cranial nerve II-XII: Pupils were equal round reactive to light. Extraocular movements were full, visual field were full on confrontational test. Facial sensation and strength were normal. Uvula tongue midline. Head turning and shoulder shrug  were normal and symmetric. Motor: The motor testing reveals 5 over 5 strength in all extremities with the exception of 4/5 strength in the right lower extremity.  Foot drop on the right. Sensory: Sensory testing is intact to soft touch on all 4 extremities. No evidence of extinction is noted.  Coordination: Cerebellar testing reveals good finger-nose-finger and heel-to-shin bilaterally.  Gait and station: Patient has a mild circumduction type gait on the right.  Also drags her right toe when ambulating.  Tandem gait is unsteady.  Romberg is negative. Reflexes: Deep tendon reflexes are symmetric and normal bilaterally.   DIAGNOSTIC DATA (LABS, IMAGING, TESTING) - I reviewed patient records, labs, notes, testing and imaging myself where available.  Lab Results  Component Value Date   WBC 11.2 (H) 06/11/2017   HGB 11.9 06/11/2017   HCT 35.5 06/11/2017   MCV 83 06/11/2017   PLT 363 06/11/2017      Component Value Date/Time   NA 140 06/11/2017 1610   K 4.2 06/11/2017 1610   CL 102 06/11/2017 1610   CO2 25 06/11/2017 1610   GLUCOSE 82 06/11/2017 1610   GLUCOSE 84 05/18/2010 1900   BUN 10 06/11/2017 1610   CREATININE 0.80 06/11/2017 1610   CALCIUM 9.5 06/11/2017 1610   PROT 7.3 06/11/2017 1610   ALBUMIN 4.4 06/11/2017 1610   AST 15 06/11/2017 1610   ALT 7 06/11/2017 1610   ALKPHOS 86 06/11/2017  1610   BILITOT 0.4 06/11/2017 1610   GFRNONAA 94 06/11/2017 1610   GFRAA 108 06/11/2017 1610    Lab Results  Component Value Date   TSH 0.815 06/11/2017      ASSESSMENT AND PLAN 37 y.o. year old female  has a past medical history of Abnormality of gait, Hypertension, MS (multiple sclerosis) (HCC), Other nonspecific abnormal result of function study of brain and central nervous system, and Personal history of fall. here with :  1.  Multiple sclerosis 2.  Abnormality of gait 3.  Depression  The patient will continue on Ocrevus.  I will check blood work today.  I will also repeat MRI of the brain to look for progression of MS.  We discussed an AFO brace for her right foot drop however she states that she will let me know if she wants to proceed with this.  I advised the patient that if her depression worsens and she would like to try medication she should let us know.  She voiced understanding.  If her symptoms worsen or she develops new symptoms she will let us know.  She will follow-up in 6 months or sooner if needed.  Butch Penny, MSN, NP-C 12/10/2017, 2:10 PM [facility] [address], [address] [place], [place] [address] [phone]   .

## 2017-12-11 ENCOUNTER — Telehealth: Payer: Self-pay | Admitting: *Deleted

## 2017-12-11 LAB — COMPREHENSIVE METABOLIC PANEL
A/G RATIO: 1.3 (ref 1.2–2.2)
ALT: 6 IU/L (ref 0–32)
AST: 9 IU/L (ref 0–40)
Albumin: 4.1 g/dL (ref 3.5–5.5)
Alkaline Phosphatase: 91 IU/L (ref 39–117)
BUN/Creatinine Ratio: 12 (ref 9–23)
BUN: 11 mg/dL (ref 6–20)
Bilirubin Total: 0.3 mg/dL (ref 0.0–1.2)
CALCIUM: 9 mg/dL (ref 8.7–10.2)
CO2: 24 mmol/L (ref 20–29)
CREATININE: 0.91 mg/dL (ref 0.57–1.00)
Chloride: 103 mmol/L (ref 96–106)
GFR calc Af Amer: 93 mL/min/{1.73_m2} (ref >59)
GFR, EST NON AFRICAN AMERICAN: 80 mL/min/{1.73_m2} (ref >59)
GLOBULIN, TOTAL: 3.1 g/dL (ref 1.5–4.5)
Glucose: 76 mg/dL (ref 65–99)
POTASSIUM: 4.7 mmol/L (ref 3.5–5.2)
SODIUM: 140 mmol/L (ref 134–144)
Total Protein: 7.2 g/dL (ref 6.0–8.5)

## 2017-12-11 LAB — CBC WITH DIFFERENTIAL/PLATELET
BASOS: 0 %
Basophils Absolute: 0 10*3/uL (ref 0.0–0.2)
EOS (ABSOLUTE): 0 10*3/uL (ref 0.0–0.4)
EOS: 0 %
HEMATOCRIT: 35.4 % (ref 34.0–46.6)
Hemoglobin: 11.4 g/dL (ref 11.1–15.9)
IMMATURE GRANULOCYTES: 0 %
Immature Grans (Abs): 0 10*3/uL (ref 0.0–0.1)
Lymphocytes Absolute: 1.6 10*3/uL (ref 0.7–3.1)
Lymphs: 17 %
MCH: 27.7 pg (ref 26.6–33.0)
MCHC: 32.2 g/dL (ref 31.5–35.7)
MCV: 86 fL (ref 79–97)
MONOS ABS: 0.6 10*3/uL (ref 0.1–0.9)
Monocytes: 6 %
NEUTROS ABS: 7.2 10*3/uL — AB (ref 1.4–7.0)
NEUTROS PCT: 77 %
PLATELETS: 349 10*3/uL (ref 150–450)
RBC: 4.12 x10E6/uL (ref 3.77–5.28)
RDW: 15.6 % — AB (ref 12.3–15.4)
WBC: 9.4 10*3/uL (ref 3.4–10.8)

## 2017-12-11 NOTE — Telephone Encounter (Signed)
LVM informing patient her labs are unremarkable. Also advised if she doesn't get a call soon to schedule MRI, to call this office.left number.

## 2017-12-25 ENCOUNTER — Ambulatory Visit
Admission: RE | Admit: 2017-12-25 | Discharge: 2017-12-25 | Disposition: A | Discharge status: Home / Self Care | Payer: Medicaid Other | Source: Ambulatory Visit | Attending: Adult Health | Admitting: Adult Health

## 2017-12-25 DIAGNOSIS — G35 Multiple sclerosis: Secondary | ICD-10-CM | POA: Diagnosis not present

## 2017-12-25 MED ORDER — GADOBENATE DIMEGLUMINE 529 MG/ML IV SOLN
17.0000 mL | Freq: Once | INTRAVENOUS | Status: AC | PRN
  Administered 2017-12-25: 17 mL via INTRAVENOUS

## 2017-12-30 ENCOUNTER — Ambulatory Visit (HOSPITAL_COMMUNITY): Payer: Medicaid Other

## 2017-12-31 ENCOUNTER — Telehealth: Payer: Self-pay | Admitting: Neurology

## 2017-12-31 NOTE — Telephone Encounter (Signed)
MRI brain w/wo showed no significant changes.  IMPRESSION: This MRI of the brain with and without contrast shows the following: 1.    Multiple T2/FLAIR hyperintense foci in the hemispheres, brainstem and cerebellum in a pattern and configuration consistent with chronic demyelinating plaque associated with multiple sclerosis.  None of the foci appears to be acute.  When compared to the MRI dated 01/02/2017, there is no interval change. 2.    There is a normal enhancement pattern and there are no acute findings.

## 2017-12-31 NOTE — Telephone Encounter (Signed)
Left patient a detailed message, with results, on voicemail (ok per DPR).  Provided our number to call back with any questions.  

## 2018-01-02 ENCOUNTER — Ambulatory Visit (HOSPITAL_COMMUNITY): Payer: Medicaid Other

## 2018-01-15 ENCOUNTER — Ambulatory Visit (HOSPITAL_COMMUNITY): Payer: Medicaid Other

## 2018-01-16 ENCOUNTER — Ambulatory Visit (HOSPITAL_COMMUNITY): Payer: Medicaid Other

## 2018-01-19 MED FILL — OCREVUS 300 MG/10ML SOLN: 300 | 30 days supply | Qty: 20 | Fill #0

## 2018-01-27 ENCOUNTER — Other Ambulatory Visit: Payer: Self-pay | Admitting: Neurology

## 2018-01-27 DIAGNOSIS — G35 Multiple sclerosis: Secondary | ICD-10-CM

## 2018-01-27 NOTE — Progress Notes (Signed)
Order for ocrevus was placed.

## 2018-01-28 ENCOUNTER — Other Ambulatory Visit (HOSPITAL_COMMUNITY): Payer: Self-pay | Admitting: General Practice

## 2018-01-30 ENCOUNTER — Encounter (HOSPITAL_COMMUNITY): Payer: Self-pay

## 2018-01-30 ENCOUNTER — Encounter (HOSPITAL_COMMUNITY)
Admission: RE | Admit: 2018-01-30 | Discharge: 2018-01-30 | Disposition: A | Discharge status: Home / Self Care | Payer: Medicaid Other | Source: Ambulatory Visit | Attending: Neurology | Admitting: Neurology

## 2018-01-30 DIAGNOSIS — G35 Multiple sclerosis: Secondary | ICD-10-CM | POA: Diagnosis present

## 2018-01-30 MED ORDER — DIPHENHYDRAMINE HCL 25 MG PO CAPS
50.0000 mg | ORAL_CAPSULE | Freq: Every day | ORAL | Status: AC
  Administered 2018-01-30: 50 mg via ORAL
  Filled 2018-01-30: qty 2

## 2018-01-30 MED ORDER — OCRELIZUMAB 300 MG/10ML IV SOLN
600.0000 mg | Freq: Once | INTRAVENOUS | Status: AC
  Administered 2018-01-30: 600 mg via INTRAVENOUS
  Filled 2018-01-30: qty 20

## 2018-01-30 MED ORDER — METHYLPREDNISOLONE SODIUM SUCC 1000 MG IJ SOLR
500.0000 mg | INTRAMUSCULAR | Status: DC
  Administered 2018-01-30: 500 mg via INTRAVENOUS
  Filled 2018-01-30: qty 4

## 2018-01-30 MED ORDER — SODIUM CHLORIDE 0.9 % IV SOLN
INTRAVENOUS | Status: DC
Start: 2018-01-30 — End: 2018-01-31
  Administered 2018-01-30: 09:00:00 via INTRAVENOUS

## 2018-06-15 ENCOUNTER — Ambulatory Visit (INDEPENDENT_AMBULATORY_CARE_PROVIDER_SITE_OTHER): Payer: Medicaid Other | Admitting: Adult Health

## 2018-06-15 ENCOUNTER — Encounter: Payer: Self-pay | Admitting: Adult Health

## 2018-06-15 VITALS — BP 168/104 | HR 76 | Ht 64.0 in | Wt 189.4 lb

## 2018-06-15 DIAGNOSIS — R269 Unspecified abnormalities of gait and mobility: Secondary | ICD-10-CM | POA: Diagnosis not present

## 2018-06-15 DIAGNOSIS — G35 Multiple sclerosis: Secondary | ICD-10-CM | POA: Diagnosis not present

## 2018-06-15 DIAGNOSIS — F329 Major depressive disorder, single episode, unspecified: Secondary | ICD-10-CM | POA: Diagnosis not present

## 2018-06-15 DIAGNOSIS — R296 Repeated falls: Secondary | ICD-10-CM

## 2018-06-15 DIAGNOSIS — M21371 Foot drop, right foot: Secondary | ICD-10-CM

## 2018-06-15 DIAGNOSIS — F32A Depression, unspecified: Secondary | ICD-10-CM

## 2018-06-15 NOTE — Progress Notes (Signed)
I have reviewed and agreed above plan. 

## 2018-06-15 NOTE — Patient Instructions (Addendum)
Your Plan:  Continue Ocrevus Blood work today referrral to PT Speak to PCP or OB about Blood pressure If your symptoms worsen or you develop new symptoms please let us know.     Thank you for coming to see [place] at [facility]. I hope we have been able to provide you high quality care today.  You may receive a patient satisfaction survey over the next few weeks. We would appreciate your feedback and comments so that we may continue to improve ourselves and the health of our patients.

## 2018-06-15 NOTE — Progress Notes (Signed)
PATIENT: ANI DEOLIVEIRA DOB: Oct 22, 1980  REASON FOR VISIT: follow up HISTORY FROM: patient  HISTORY OF PRESENT ILLNESS:  HISTORYHISTORY copied from Dr. Zannie Cove notes: She has history of hypertension, in May 18, 2010,while walking towards her car at the end of her working day, her legs give out underneath her, she fell face down on the concrete pavement at her Parking lot infront of [employer], knocked out her front teeth.   MRI of the brain,in May 19, 2010 without contrast, demonstrated diffuse white matter changes throughout the supratentorial and infratentorial region, with corpus callosum involvement, MRI scan of the cervical spine shows demyelinating enhancing plaques at C 3 on right and C 4-5 on left. consistent with MS   Repeat MRI scan of the brain in 2012, showed multiple brainstem, cerebellar, subcortical, corpus callosal and periventricular white matter lesions typical for demyelinating disease. Several T 1 black holes and enhancing lesions are seen bilaterally suggesting chronic and active disease. Significant progression and worsening compared with noncontrast MRI 05/19/10.  CSF showed elevated Ig G index, 9 ocb,extensive lab test for mimic were negative, including HIV, RPR, b12 b6, ACE lyme SSA, B, CBC, CMP, PEP, ana, copper, mild ele crp 6.5, and very low vit D<4.   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 In 05/17/13, index value was 1.17. Positive with titer of 1.6 9 in June 07 2014  UPDATE Jan 5th 2016: She was on long term disability for 2-3 years, now she is in the process of applying for social disability, waiting for the final  decision. She still has mild gait difficulty, muscle spasm, urinary urgency, occasional bowel urgency.  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 she started baclofen 10 mg 3 times a day since January fifth 2016, mild drowsiness, does help her lower extremity spasticity.  UPDATE March 22nd 2016: We have reviewed MRI of brain Feb 2016, stable, but moderate burden of round and ovoid, periventricular, subcortical, pericallosal, juxtacortical, midbrain, pontine, cerebellar and cervicomedullary junction chronic demyelinating plaques. Many of these are hypointense on T1 views. No abnormal lesions are seen on post contrast views. Compared to MRI on 12/02/13, no significant change.  She continues to have occasional falling episode, mild unsteady gait.  UPDATE May 19th 2016: She came in for [facility] 9-21 screening visit, She has Mirena IUD insertion in May 6th 2016 at [facility], also using condom contraceptives since 2011, I have emphasized the importance of contraception, she voiced understanding  UPDATE January 22 2016: She is here with her mother and daughter at today's clinical visit, she has slow worsening gait abnormality, dragging her right foot more, also noticed more clumsiness at her right hand since Nov 2016,   She is still taking baclofen 10 mg twice a day, she is receiving Tysarbri infusion every month at [facility] stay, most recent infusion was January 19 2016, she tolerated the infusion well, no significant side effect noticed. She is still finding her disability,  UPDATE Oct 11th 2017:  Laboratory evaluation showed negative anti-Tysarbri antibody,normal CMP, CBC, significantly low vitamin D level 9.4, normal CBC.  She has received IV Solu-Medrol 3 days in September 2017 without  significant improvement, We have personally reviewed and compared to MRI scans, most recent September 2017, multiple T2/FLAIR hyperintensity foci within the brain stem, cerebellum, middle cerebellar peduncle, spinal cord periventricular, chest of cortical and deep white matter, no contrast enhancement, no significant change compared to 2016.  UPDATE Mar 13 2017: She has loose bowels, gait abnormality, gradually increase so she fell sometimes, her blood pressure fluctuate, she is receiving Tysarbri every month, Right leg dragging more.  JC-virus antibody was 2.88 positive, titer was trending up.  We have personally reviewed MRI of the brain with and without contrast in August in comparison to previous MRIs in 2017, extensive MS lesions, no contrast enhancement, no significant change  UPDATE Jun 11 2017: She continues to struggle with worsening gait abnormality, right side has more difficulty, her disability is denied again,  She is approved for ocrelizumab, last Tysabri infusion was May 30, 2017.  Reviewed laboratory evaluation normalor negative hepatitis B, C,TSH, CMP, Cbc, TB   Update 12/10/17: Ms. Bascom LevelsFrazier is a 38 year old female with a history of multiple sclerosis.  She returns today for follow-up.  She is currently on Ocrevus and tolerating it well.  Her next infusion will be in August.  She continues to experience weakness primarily on the right side.  States occasionally she will wake up with muscle cramps.  She is on Robaxin.  She denies any changes with bowel or bladder.  She does report urinary incontinence at times.  She states that she may have 2-3 episodes a week.  No change in her vision.  She does report  depression.  However she does not want to be on medication at this time.  Her mom reports that she has good days and bad days.  Her last fall was on July 4.  She does drag the right foot but this is been ongoing.  Patient reports that she suffers with a lot of  fatigue.  She states that a lot of times she does not have the energy to complete task.  She states that she tries to walk with her mom but is unable to walk for long distances without having to rest.  She states that she also misses out on doing things with her daughter.  He states that her daughter has a dance competition coming up but she is unable to go because of the long ride in the car.  She also reports that she has trouble thinking clearly.  Reports forgetfulness at times.  She returns today for evaluation.  Today 06/15/18:  Ms. Bascom LevelsFrazier is a 38 year old female with a history of multiple sclerosis.  She returns today for follow-up.  She is currently on Ocrevus.  Her next infusion is in February.  She denies any new numbness or weakness.  Does report some back pain at times.  Her mother states that she has been having frequent falls.  She does use a cane or walker.  She does not wear an AFO brace.  She does have a right foot drop.  She states most of her falls are due to the right foot drop.  Denies any changes with the bowels or bladder.  Denies any significant changes with her sleep.  She does feel that she still has periods of depression.  She thinks that this is the cause of most of her fatigue.  She continues to use Robaxin for muscle spasms.  She states that she has been having trouble managing her blood pressure.  She is on atenolol but reports that most of the time it is elevated.  She states that her OB prescribed a medication but she has not made her aware of the blood pressure issues.  She returns today for an evaluation.     REVIEW OF SYSTEMS: Out of a complete 14 system review of symptoms, the patient complains only of the following symptoms, and all other reviewed systems are negative.  Incontinence of bladder, frequency of urination, headache, aching muscles, muscle cramps, walking difficulty, ringing in ears, fatigue  ALLERGIES: No Known Allergies  HOME MEDICATIONS: Outpatient  Medications Prior to Visit  Medication Sig Dispense Refill  . atenolol (TENORMIN) 50 MG tablet Take 1 tablet (50 mg total) by mouth daily. 90 tablet 4  . calcium citrate (CALCITRATE - DOSED IN MG ELEMENTAL CALCIUM) 950 MG tablet Take 1 tablet by mouth daily.      . Cholecalciferol (VITAMIN D3) 1000 units CAPS Take by mouth daily.    Marland Kitchen ibuprofen (ADVIL,MOTRIN) 800 MG tablet Take 800 mg by mouth as needed.    Marland Kitchen lisinopril (PRINIVIL,ZESTRIL) 40 MG tablet Take 0.5 tablets (20 mg total) by mouth daily. 30 tablet 11  . methocarbamol (ROBAXIN) 500 MG tablet Take 500 mg by mouth as needed.    Marland Kitchen ocrelizumab 600 mg in sodium chloride 0.9 % 500 mL Inject 600 mg into the vein every 6 (six) months.    . OCREVUS 300 MG/10ML injection INFUSE 600MG  ON DAY 1, REPEAT ON DAY 15 20 mL 0   No facility-administered medications prior to visit.     PAST MEDICAL HISTORY: Past Medical History:  Diagnosis Date  . Abnormality of gait   . Hypertension   . MS (multiple sclerosis) (HCC)   . Other nonspecific abnormal result of function study of brain and central nervous system   . Personal history of fall     PAST SURGICAL HISTORY: Past Surgical History:  Procedure Laterality Date  . CHOLECYSTECTOMY    . GALLBLADDER SURGERY      FAMILY HISTORY: Family History  Problem Relation Age of Onset  . Cancer Maternal Grandmother   . High blood pressure Unknown        Runs in both sides of the family    SOCIAL HISTORY: Social History   Socioeconomic History  . Marital status: Single    Spouse name: Not on file  . Number of children: 2  . Years of education: 58  . Highest education level: Not on file  Occupational History  . Occupation: FILE Marine scientist: [employer]  Social Needs  . Financial resource strain: Not on file  . Food insecurity:    Worry: Not on file    Inability: Not on file  . Transportation needs:    Medical: Not on file    Non-medical: Not on file  Tobacco Use  . Smoking  status: Never Smoker  . Smokeless tobacco: Never Used  Substance and Sexual Activity  . Alcohol use: No  . Drug use: No  . Sexual activity: Not on file  Lifestyle  . Physical activity:    Days per week: Not on file    Minutes per session: Not on file  . Stress: Not on file  Relationships  . Social connections:    Talks on phone: Not on file    Gets together:  Not on file    Attends religious service: Not on file    Active member of club or organization: Not on file    Attends meetings of clubs or organizations: Not on file    Relationship status: Not on file  . Intimate partner violence:    Fear of current or ex partner: Not on file    Emotionally abused: Not on file    Physically abused: Not on file    Forced sexual activity: Not on file  Other Topics Concern  . Not on file  Social History Narrative   Patient has a high school education and two children. Patient is not currently working.   Patient is right-handed.   Patient drinks three cups of tea daily and two cans of soda daily.      PHYSICAL EXAM  Vitals:   06/15/18 0708 06/15/18 0725  BP: (!) 168/104 (!) 168/104  Pulse: 75 76  Weight: 189 lb 6.4 oz (85.9 kg)   Height: 5\' 4"  (1.626 m)    Body mass index is 32.51 kg/m.  Generalized: Well developed, in no acute distress   Neurological examination  Mentation: Alert oriented to time, place, history taking. Follows all commands speech and language fluent Cranial nerve II-XII: Pupils were equal round reactive to light. Extraocular movements were full, visual field were full on confrontational test. Facial sensation and strength were normal. Uvula tongue midline. Head turning and shoulder shrug  were normal and symmetric. Motor: The motor testing reveals 5 over 5 strength of all 4 extremities. Good symmetric motor tone is noted throughout.  Sensory: Sensory testing is intact to soft touch on all 4 extremities. No evidence of extinction is noted.  Coordination:  Cerebellar testing reveals good finger-nose-finger and heel-to-shin bilaterally-some difficulty with heel shin on the right due to mobility Gait and station: Patient uses a cane when ambulating.  She tends to drag the right foot.  Tandem gait not attempted. Reflexes: Deep tendon reflexes are symmetric and brisk bilaterally  DIAGNOSTIC DATA (LABS, IMAGING, TESTING) - I reviewed patient records, labs, notes, testing and imaging myself where available.  Lab Results  Component Value Date   WBC 9.4 12/10/2017   HGB 11.4 12/10/2017   HCT 35.4 12/10/2017   MCV 86 12/10/2017   PLT 349 12/10/2017      Component Value Date/Time   NA 140 12/10/2017 1409   K 4.7 12/10/2017 1409   CL 103 12/10/2017 1409   CO2 24 12/10/2017 1409   GLUCOSE 76 12/10/2017 1409   GLUCOSE 84 05/18/2010 1900   BUN 11 12/10/2017 1409   CREATININE 0.91 12/10/2017 1409   CALCIUM 9.0 12/10/2017 1409   PROT 7.2 12/10/2017 1409   ALBUMIN 4.1 12/10/2017 1409   AST 9 12/10/2017 1409   ALT 6 12/10/2017 1409   ALKPHOS 91 12/10/2017 1409   BILITOT 0.3 12/10/2017 1409   GFRNONAA 80 12/10/2017 1409   GFRAA 93 12/10/2017 1409    Lab Results  Component Value Date   TSH 0.815 06/11/2017      ASSESSMENT AND PLAN 38 y.o. year old female  has a past medical history of Abnormality of gait, Hypertension, MS (multiple sclerosis) (HCC), Other nonspecific abnormal result of function study of brain and central nervous system, and Personal history of fall. here with:  1.  Multiple sclerosis 2.  Abnormality of gait 3.  Depression 4.  Right foot drop  The patient will continue on Ocrevus.  I will check blood work today.  Her  last MRI did not show any significant changes.  I have placed a referral to physical therapy for frequent falls and for an AFO brace.  We discussed starting an antidepressant.  However the patient does not want to start any medication that may increase her blood pressure.  I did advise that most of the  antidepressants has a potential side effect for hypertension although the occurrence is relatively low.  Patient would rather discuss her hypertension with her OB before initiating an antidepressant.  I voiced understanding.  She is advised that if her symptoms worsen or she develops new symptoms she should let us know.  She will follow-up in 6 months or sooner if needed.     Butch PennyMegan Nikiesha Milford, MSN, NP-C 06/15/2018, 7:34 AM [facility] [address], [address] [place], [place] [address] [phone]

## 2018-06-16 ENCOUNTER — Encounter: Payer: Self-pay | Admitting: Physical Therapy

## 2018-06-16 ENCOUNTER — Ambulatory Visit: Payer: Medicaid Other | Attending: Adult Health | Admitting: Physical Therapy

## 2018-06-16 ENCOUNTER — Telehealth: Payer: Self-pay | Admitting: Neurology

## 2018-06-16 ENCOUNTER — Other Ambulatory Visit: Payer: Self-pay

## 2018-06-16 DIAGNOSIS — R2689 Other abnormalities of gait and mobility: Secondary | ICD-10-CM | POA: Diagnosis present

## 2018-06-16 DIAGNOSIS — M6281 Muscle weakness (generalized): Secondary | ICD-10-CM | POA: Diagnosis present

## 2018-06-16 DIAGNOSIS — R296 Repeated falls: Secondary | ICD-10-CM | POA: Diagnosis present

## 2018-06-16 DIAGNOSIS — R2681 Unsteadiness on feet: Secondary | ICD-10-CM | POA: Diagnosis present

## 2018-06-16 LAB — CBC WITH DIFFERENTIAL/PLATELET
BASOS: 0 %
Basophils Absolute: 0 10*3/uL (ref 0.0–0.2)
EOS (ABSOLUTE): 0 10*3/uL (ref 0.0–0.4)
EOS: 1 %
HEMATOCRIT: 36.8 % (ref 34.0–46.6)
HEMOGLOBIN: 11.8 g/dL (ref 11.1–15.9)
IMMATURE GRANS (ABS): 0 10*3/uL (ref 0.0–0.1)
IMMATURE GRANULOCYTES: 0 %
LYMPHS: 13 %
Lymphocytes Absolute: 0.9 10*3/uL (ref 0.7–3.1)
MCH: 28.4 pg (ref 26.6–33.0)
MCHC: 32.1 g/dL (ref 31.5–35.7)
MCV: 89 fL (ref 79–97)
MONOCYTES: 5 %
Monocytes Absolute: 0.4 10*3/uL (ref 0.1–0.9)
NEUTROS ABS: 5.8 10*3/uL (ref 1.4–7.0)
NEUTROS PCT: 81 %
PLATELETS: 321 10*3/uL (ref 150–450)
RBC: 4.16 x10E6/uL (ref 3.77–5.28)
RDW: 13.3 % (ref 11.7–15.4)
WBC: 7.2 10*3/uL (ref 3.4–10.8)

## 2018-06-16 LAB — COMPREHENSIVE METABOLIC PANEL
A/G RATIO: 1.5 (ref 1.2–2.2)
ALBUMIN: 4.1 g/dL (ref 3.5–5.5)
ALT: 6 IU/L (ref 0–32)
AST: 9 IU/L (ref 0–40)
Alkaline Phosphatase: 89 IU/L (ref 39–117)
BUN / CREAT RATIO: 8 — AB (ref 9–23)
BUN: 7 mg/dL (ref 6–20)
Bilirubin Total: 0.4 mg/dL (ref 0.0–1.2)
CALCIUM: 9.1 mg/dL (ref 8.7–10.2)
CO2: 22 mmol/L (ref 20–29)
Chloride: 104 mmol/L (ref 96–106)
Creatinine, Ser: 0.87 mg/dL (ref 0.57–1.00)
GFR, EST AFRICAN AMERICAN: 97 mL/min/{1.73_m2} (ref >59)
GFR, EST NON AFRICAN AMERICAN: 84 mL/min/{1.73_m2} (ref >59)
GLOBULIN, TOTAL: 2.7 g/dL (ref 1.5–4.5)
Glucose: 86 mg/dL (ref 65–99)
POTASSIUM: 4.4 mmol/L (ref 3.5–5.2)
Sodium: 139 mmol/L (ref 134–144)
TOTAL PROTEIN: 6.8 g/dL (ref 6.0–8.5)

## 2018-06-16 NOTE — Therapy (Signed)
[facility] [address]  [address] [place], [place], [address] Phone: [phone]   Fax:  [phone]  Physical Therapy Evaluation  Patient Details  Name: Julie Pope MRN: [id number] Date of Birth: 01/20/1981  Referring Provider (PT): Levert FeinsteinYijun Yan, MD   Encounter Date: 06/16/2018  PT End of Session - 06/16/18 1702    Visit Number  1    Number of Visits  4    Date for PT Re-Evaluation  07/07/18    Authorization Type  Medicaid    PT Start Time  1602    PT Stop Time  1655    PT Time Calculation (min)  53 min    Equipment Utilized During Treatment  Gait belt    Activity Tolerance  Patient tolerated treatment well    Behavior During Therapy  [facility] for tasks assessed/performed       Past Medical History:  Diagnosis Date  . Abnormality of gait   . Hypertension   . MS (multiple sclerosis) (HCC)   . Other nonspecific abnormal result of function study of brain and central nervous system   . Personal history of fall     Past Surgical History:  Procedure Laterality Date  . CHOLECYSTECTOMY    . GALLBLADDER SURGERY      There were no vitals filed for this visit.   Subjective Assessment - 06/16/18 1603    Subjective  Patient present with mom. Reports that she was diagnosed with MS in 2012. Reports she has been struggling with walking and falls constantly, worse in the last couple years. Uses rolling walker or cane for ambulation, but did not bring either with her today. Still falling even with use of walker. 2 weeks ago she had a really bad fall outside when using walker- reports that she feels that her legs gave out; scraped her knees but no other injuries. She can typically get up from floor after falling if she has something to grab onto.  Struggles with drop foot on R LE, feels off balance in crowded room, uses electric scooter in grocery stores, cannot use stairs, imbalance with standing and brushing teeth. Reports that she  has a fall at least 1x/day. Typically has pain in R medial calf, like a muscle spasm. Also intermittent muscle spasms in B hands.    Patient is accompained by:  Family member   mother   Pertinent History  MS, HTN    Limitations  Lifting;Standing;Walking;House hold activities    Diagnostic tests  12/26/17 brain MRI: Multiple T2/FLAIR hyperintense foci in the hemispheres, brainstem and cerebellum in a pattern and configuration consistent with chronic demyelinating plaque associated with multiple sclerosis.  None of the foci appears to be acute.  When compared to the MRI dated 01/02/2017, there is no interval change.    Patient Stated Goals  "be able to walk without falling"    Currently in Pain?  No/denies    Pain Location  Calf    Pain Orientation  Right;Medial    Pain Descriptors / Indicators  Spasm    Pain Type  Chronic pain         [facility] PT Assessment - 06/16/18 0001      Assessment   Medical Diagnosis  Abnormality of gait, frequent falls    Referring Provider (PT)  Levert FeinsteinYijun Yan, MD    Onset Date/Surgical Date  --   dx w/ MS 2012   Hand Dominance  Right    Next MD  Visit  --   not scheduled   Prior Therapy  No      Precautions   Precautions  Fall   frequent falls     Restrictions   Weight Bearing Restrictions  No      Balance Screen   Has the patient fallen in the past 6 months  Yes   multiple- falls 1x/day   Has the patient had a decrease in activity level because of a fear of falling?   Yes    Is the patient reluctant to leave their home because of a fear of falling?   Yes      Home Environment   Living Environment  Private residence    Living Arrangements  Children;Parent    Available Help at Discharge  Family    Type of Home  House    Home Access  Stairs to enter    Entrance Stairs-Number of Steps  1    Entrance Stairs-Rails  None    Home Layout  One level    Home Equipment  [place] - single point;Walker - 2 wheels;Shower seat      Prior Function   Level of  Independence  --   help with cooking, bathing, cleaning   Vocation  Unemployed    Leisure  walking and being around her daughter      Cognition   Overall Cognitive Status  Within Functional Limits for tasks assessed      Sensation   Light Touch  --   intermittent N/T in B tips of fingers and medial foot     Coordination   Gross Motor Movements are Fluid and Coordinated  No   discontinuous movements     Posture/Postural Control   Posture/Postural Control  Postural limitations    Postural Limitations  Rounded Shoulders;Forward head      ROM / Strength   AROM / PROM / Strength  Strength      Strength   Strength Assessment Site  Hip;Knee;Ankle    Right/Left Hip  Right;Left    Right Hip Flexion  4-/5    Right Hip ABduction  4+/5    Right Hip ADduction  4/5    Left Hip Flexion  4+/5    Left Hip ABduction  4+/5    Left Hip ADduction  4+/5    Right/Left Knee  Right;Left    Right Knee Flexion  4-/5   pain in medial calf   Right Knee Extension  4-/5   pain in medila calf   Left Knee Flexion  4+/5    Left Knee Extension  4+/5    Right/Left Ankle  Right;Left    Right Ankle Dorsiflexion  2-/5    Right Ankle Plantar Flexion  2+/5    Left Ankle Dorsiflexion  4/5    Left Ankle Plantar Flexion  4/5      Flexibility   Soft Tissue Assessment /Muscle Length  yes   B gastrocs moderately tight     Palpation   Palpation comment  no TTP to R medial calf      Ambulation/Gait   Assistive device  None    Gait Pattern  Step-through pattern;Decreased hip/knee flexion - right;Decreased step length - left;Poor foot clearance - right   L hip drop   Gait velocity  decreased      Standardized Balance Assessment   Standardized Balance Assessment  Timed Up and Go Test;Five Times Sit to Stand    Five times sit to stand comments  33.1 sec with UE support      Timed Up and Go Test   Normal TUG (seconds)  19.8   without AD     High Level Balance   High Level Balance Comments  M-CTSIB:  EO/firm and EC/firm mild sway, EO/foam and EC/foam with moderate sway                Objective measurements completed on examination: See above findings.              PT Education - 06/16/18 1701    Education Details  prognosis, POC, HEP    Person(s) Educated  Patient;Parent(s)   mom   Methods  Explanation;Demonstration;Tactile cues;Verbal cues;Handout    Comprehension  Verbalized understanding;Returned demonstration       PT Short Term Goals - 06/16/18 1709      PT SHORT TERM GOAL #1   Title  Patient to be independent with initial HEP.    Time  1    Period  Weeks    Status  New    Target Date  06/23/18        PT Long Term Goals - 06/16/18 1709      PT LONG TERM GOAL #1   Title  Patient to be independent with advanced HEP.    Time  3    Period  Weeks    Status  New    Target Date  07/07/18      PT LONG TERM GOAL #2   Title  Patient to improve 5xSTS by 10 sec (23.1 sec) without use of UEs.     Time  3    Period  Weeks    Status  New    Target Date  07/07/18      PT LONG TERM GOAL #3   Title  Patient to score <14 sec on TUG testing with LRAD to decrease risk of falls.     Time  3    Period  Weeks    Status  New    Target Date  07/07/18      PT LONG TERM GOAL #4   Title  Patient to report 50% improvement in frequency of falls at home.     Time  3    Period  Weeks    Status  New    Target Date  07/07/18      PT LONG TERM GOAL #5   Title  Patient to demonstrate reciprocal stair climbing up/down 13 steps with use of 1 handrail and no instability.     Time  3    Period  Weeks    Status  New    Target Date  07/07/18             Plan - 06/16/18 1702    Clinical Impression Statement  Patient is a 37y/o F with PMH significat for MS presenting to OPPT with mother with c/o imbalance and frequent falls. Patient uses rolling walker or SPC, however not using AD today. Reports that she falls at least 1x/day, even with use of AD. Notes  difficulty with drop foot on R LE, feels off balance in crowded room, unable to ambulate in grocery stores, cannot use stairs, imbalance with standing and brushing teeth. Also struggling with muscle spasms in R posterior tibialis/medial gastroc as well as B hands. Patient today with limited B LE strength- worse on R, fair static standing balance on firm and foam surfaces, gait deviations, decreased gastroc flexibility. Patient  scored 19.8 sec on TUG testing without use of AD and 33.1 sec on 5xSTS with use of B UEs for support, indicating increased risk of falls. Educated patient and mother on gentle stretching and balance HEP with counter top and chair for support. Both reported understanding. Advised patient to bring AD with her next time for safety. Would benefit from skilled PT services 1x/week for 3 weeks to address aforementioned impairments.     Clinical Presentation  Stable    Clinical Decision Making  Low    Rehab Potential  Good    PT Frequency  1x / week    PT Duration  3 weeks    PT Treatment/Interventions  ADLs/Self Care Home Management;Cryotherapy;Electrical Stimulation;Functional mobility training;Stair training;Gait training;DME Instruction;Ultrasound;Moist Heat;Therapeutic activities;Therapeutic exercise;Balance training;Neuromuscular re-education;Patient/family education;Orthotic Fit/Training;Passive range of motion;Manual techniques;Dry needling;Energy conservation;Splinting;Taping;Vasopneumatic Device;Vestibular    PT Next Visit Plan  reassess HEP    Consulted and Agree with Plan of Care  Patient       Patient will benefit from skilled therapeutic intervention in order to improve the following deficits and impairments:  Abnormal gait, Decreased coordination, Decreased range of motion, Difficulty walking, Increased muscle spasms, Decreased safety awareness, Decreased activity tolerance, Pain, Improper body mechanics, Impaired flexibility, Decreased balance, Decreased strength, Postural  dysfunction  Visit Diagnosis: Unsteadiness on feet  Other abnormalities of gait and mobility  Muscle weakness (generalized)  Repeated falls     Problem List Patient Active Problem List   Diagnosis Date Noted  . Secondary hypertension 06/11/2017  . Essential hypertension 03/08/2015  . Encounter for therapeutic drug monitoring 03/17/2014  . Multiple sclerosis (HCC) 11/10/2012  . Personal history of fall   . Abnormality of gait   . Other nonspecific abnormal result of function study of brain and central nervous system     Anette Guarneri, PT, DPT 06/16/18 5:13 PM    [facility] [address]  [address] [place], [place], [address] Phone: [phone]   Fax:  [phone]  Name: Julie Pope MRN: [id number] Date of Birth: 04/23/1981

## 2018-06-16 NOTE — Telephone Encounter (Signed)
Called the patient and advised the patient of the normal labs. Pt verbalized understanding. Pt had no questions at this time but was encouraged to call back if questions arise.

## 2018-06-16 NOTE — Telephone Encounter (Signed)
-----   Message from Butch Penny, NP sent at 06/16/2018  7:20 AM EST ----- Labs results are relatively unremarkable. Please call patient with results.

## 2018-06-22 ENCOUNTER — Telehealth: Payer: Self-pay | Admitting: Adult Health

## 2018-06-22 MED ORDER — SERTRALINE HCL 25 MG PO TABS: 25.0000 mg | ORAL_TABLET | Freq: Every day | ORAL | 5 refills | Status: DC

## 2018-06-22 NOTE — Telephone Encounter (Signed)
I have called in Zoloft 25 mg daily for the patient.  The patient has ibuprofen listed on her med rec.  If she is taking this only on an as-needed basis then that is fine.  Just caution the patient that if she is taking it daily that adding Zoloft may increase her risk for GI bleed.

## 2018-06-22 NOTE — Telephone Encounter (Addendum)
Spoke with patient and informed her Megan sent in Rx for Zoloft for depression. I asked if she takes Ibuprofen daily. She stated only once a day if needed.  She stated Dr Terrace Arabia told her to take it for muscle spasms. I advised her taking it with zoloft can increase risk of GI bleed.  Advised  robaxin can help with muscle spasms. She stated she'll stop taking ibuprofen and pick up zoloft and check for side effects listed. I advised she call for any questions or problems when she starts new med. Patient verbalized understanding, appreciation.

## 2018-06-22 NOTE — Telephone Encounter (Signed)
Pt states she spoke to her PCP about getting on antidepressants and the PCP told her it was ok that she can get on them. She would like to move forward. Please advise.

## 2018-06-23 ENCOUNTER — Ambulatory Visit: Payer: Medicaid Other

## 2018-06-23 DIAGNOSIS — R2681 Unsteadiness on feet: Secondary | ICD-10-CM

## 2018-06-23 DIAGNOSIS — M6281 Muscle weakness (generalized): Secondary | ICD-10-CM

## 2018-06-23 DIAGNOSIS — R2689 Other abnormalities of gait and mobility: Secondary | ICD-10-CM

## 2018-06-23 DIAGNOSIS — R296 Repeated falls: Secondary | ICD-10-CM

## 2018-06-23 NOTE — Therapy (Addendum)
[facility] [address]  [place], [place], [address] Phone: [phone]   Fax:  [phone]  Physical Therapy Treatment  Patient Details  Name: Julie Pope MRN: [id number] Date of Birth: [birth year] Referring Provider (PT): Marcial Pacas, MD   Encounter Date: 06/23/2018  PT End of Session - 06/23/18 1621    Visit Number  2    Number of Visits  4    Date for PT Re-Evaluation  07/07/18    Authorization Type  Medicaid    PT Start Time  1615    PT Stop Time  1653    PT Time Calculation (min)  38 min    Equipment Utilized During Treatment  --    Activity Tolerance  Patient tolerated treatment well    Behavior During Therapy  [facility] for tasks assessed/performed       Past Medical History:  Diagnosis Date  . Abnormality of gait   . Hypertension   . MS (multiple sclerosis) ([place])   . Other nonspecific abnormal result of function study of brain and central nervous system   . Personal history of fall     Past Surgical History:  Procedure Laterality Date  . CHOLECYSTECTOMY    . GALLBLADDER SURGERY      There were no vitals filed for this visit.  Subjective Assessment - 06/23/18 1618    Subjective  Pt. reporting she fell forward onto R knee while performing sit<>stand exercise.      Patient is accompained by:  Family member   Mother    Pertinent History  MS, HTN    Diagnostic tests  12/26/17 brain MRI: Multiple T2/FLAIR hyperintense foci in the hemispheres, brainstem and cerebellum in a pattern and configuration consistent with chronic demyelinating plaque associated with multiple sclerosis.  None of the foci appears to be acute.  When compared to the MRI dated 01/02/2017, there is no interval change.    Patient Stated Goals  "be able to walk without falling"    Currently in Pain?  Yes    Pain Location  Calf    Pain Orientation  Right;Medial    Pain Descriptors / Indicators  Dull;Sharp    Pain Type  Chronic pain     Pain Frequency  Intermittent    Aggravating Factors   Walking     Multiple Pain Sites  No         [facility] PT Assessment - 06/23/18 1636      Assessment   Next MD Visit  ~ apirl                    [facility] Adult PT Treatment/Exercise - 06/23/18 1635      Knee/Hip Exercises: Stretches   Gastroc Stretch  Right;Left;1 rep;30 seconds    Gastroc Stretch Limitations  strap       Knee/Hip Exercises: Aerobic   Nustep  Lvl 1, 6 min       Knee/Hip Exercises: Standing   Heel Raises  Both;15 reps;3 seconds    Heel Raises Limitations  in RW    Knee Flexion  Right;Left;10 reps;Strengthening    Knee Flexion Limitations  chair support    Hip Flexion  Right;Left;10 reps;Knee bent    Hip Flexion Limitations  chair support    Functional Squat  10 reps;3 seconds    Functional Squat Limitations  "mini" squat holding onto walker     Other Standing Knee Exercises  R/L wt. shift  standing with LE clearance x 10 rpes at chair       Knee/Hip Exercises: Seated   Sit to Sand  10 reps;without UE support   from mat table to RW without UE support             PT Education - 06/23/18 1718    Education Details  HEP update    Person(s) Educated  Patient    Methods  Explanation;Demonstration;Verbal cues;Handout    Comprehension  Verbalized understanding;Returned demonstration;Verbal cues required;Need further instruction       PT Short Term Goals - 06/23/18 1622      PT SHORT TERM GOAL #1   Title  Patient to be independent with initial HEP.    Time  1    Period  Weeks    Status  Achieved        PT Long Term Goals - 06/23/18 1622      PT LONG TERM GOAL #1   Title  Patient to be independent with advanced HEP.    Time  3    Period  Weeks    Status  On-going      PT LONG TERM GOAL #2   Title  Patient to improve 5xSTS by 10 sec (23.1 sec) without use of UEs.     Time  3    Period  Weeks    Status  On-going      PT LONG TERM GOAL #3   Title  Patient to score <14 sec on  TUG testing with LRAD to decrease risk of falls.     Time  3    Period  Weeks    Status  On-going      PT LONG TERM GOAL #4   Title  Patient to report 50% improvement in frequency of falls at home.     Time  3    Period  Weeks    Status  On-going      PT LONG TERM GOAL #5   Title  Patient to demonstrate reciprocal stair climbing up/down 13 steps with use of 1 handrail and no instability.     Time  3    Period  Weeks    Status  On-going            Plan - 06/23/18 1628    Clinical Impression Statement  Pt. reporting a fall onto R knee performing sit<>stand exercise since last visit.  Pt. denies bruise or pain at R knee today.  Reviewed sit<>stand HEP activity with pt. today and encouraged pt. to have RW in front of her while performing this activity in future for improved safety.  Pt. tolerated all LE strengthening activities well today.  Session focused on standing activities for improved LE clearance as pt. and mother reporting pt. "scuffing" feet while walking.  HEP updated.  Will continue to progress toward goals.      Rehab Potential  Good    PT Frequency  1x / week    PT Duration  3 weeks    PT Treatment/Interventions  ADLs/Self Care Home Management;Cryotherapy;Electrical Stimulation;Functional mobility training;Stair training;Gait training;DME Instruction;Ultrasound;Moist Heat;Therapeutic activities;Therapeutic exercise;Balance training;Neuromuscular re-education;Patient/family education;Orthotic Fit/Training;Passive range of motion;Manual techniques;Dry needling;Energy conservation;Splinting;Taping;Vasopneumatic Device;Vestibular    PT Next Visit Plan  check updated HEP     Consulted and Agree with Plan of Care  Patient       Patient will benefit from skilled therapeutic intervention in order to improve the following deficits and impairments:  Abnormal gait, Decreased  coordination, Decreased range of motion, Difficulty walking, Increased muscle spasms, Decreased safety  awareness, Decreased activity tolerance, Pain, Improper body mechanics, Impaired flexibility, Decreased balance, Decreased strength, Postural dysfunction  Visit Diagnosis: Unsteadiness on feet  Other abnormalities of gait and mobility  Muscle weakness (generalized)  Repeated falls     Problem List Patient Active Problem List   Diagnosis Date Noted  . Secondary hypertension 06/11/2017  . Essential hypertension 03/08/2015  . Encounter for therapeutic drug monitoring 03/17/2014  . Multiple sclerosis ([place]) 11/10/2012  . Personal history of fall   . Abnormality of gait   . Other nonspecific abnormal result of function study of brain and central nervous system     Bess Harvest, PTA 06/23/18 5:36 PM   [facility] [address]  [place], [place], [address] Phone: [phone]   Fax:  [phone]  Name: Julie Pope MRN: [id number] Date of Birth: [birth year]  PHYSICAL THERAPY DISCHARGE SUMMARY  Visits from Start of Care: 2  Current functional level related to goals / functional outcomes: Unable to assess; patient did not return after last session   Remaining deficits: Unable to assess   Education / Equipment: HEP  Plan: Patient agrees to discharge.  Patient goals were not met. Patient is being discharged due to not returning since the last visit.  ?????     Janene Harvey, PT, DPT 07/24/18 10:04 AM

## 2018-06-30 ENCOUNTER — Ambulatory Visit: Payer: Medicaid Other | Admitting: Physical Therapy

## 2018-06-30 ENCOUNTER — Ambulatory Visit: Payer: Medicaid Other | Admitting: Adult Health

## 2018-07-07 ENCOUNTER — Ambulatory Visit: Payer: Medicaid Other | Admitting: Physical Therapy

## 2018-07-20 ENCOUNTER — Other Ambulatory Visit: Payer: Self-pay | Admitting: Neurology

## 2018-07-22 ENCOUNTER — Other Ambulatory Visit (HOSPITAL_COMMUNITY): Payer: Self-pay | Admitting: General Practice

## 2018-07-22 ENCOUNTER — Other Ambulatory Visit: Payer: Self-pay | Admitting: Neurology

## 2018-07-22 DIAGNOSIS — G35 Multiple sclerosis: Secondary | ICD-10-CM

## 2018-07-22 MED ORDER — SODIUM CHLORIDE 0.9 % IV SOLN: 600.0000 mg | Freq: Once | INTRAVENOUS | Status: DC

## 2018-07-22 MED ORDER — DIPHENHYDRAMINE HCL 25 MG PO TABS: 25.0000 mg | ORAL_TABLET | Freq: Three times a day (TID) | ORAL | Status: DC | PRN

## 2018-07-22 MED ORDER — ACETAMINOPHEN 325 MG PO TABS: 650.0000 mg | ORAL_TABLET | ORAL | Status: DC | PRN

## 2018-07-22 NOTE — Progress Notes (Signed)
I have ordered ocrelizumab 600mg  iv infusion with Tylenol and Benadryl premed.

## 2018-07-24 ENCOUNTER — Other Ambulatory Visit (HOSPITAL_COMMUNITY): Payer: Self-pay | Admitting: General Practice

## 2018-07-27 ENCOUNTER — Other Ambulatory Visit: Payer: Self-pay | Admitting: Neurology

## 2018-07-27 DIAGNOSIS — G35 Multiple sclerosis: Secondary | ICD-10-CM

## 2018-07-27 MED ORDER — ACETAMINOPHEN 325 MG PO TABS: 650.0000 mg | ORAL_TABLET | Freq: Four times a day (QID) | ORAL | Status: DC | PRN

## 2018-07-27 MED ORDER — SODIUM CHLORIDE 0.9 % IV SOLN: 600.0000 mg | Freq: Once | INTRAVENOUS | Status: DC

## 2018-07-27 MED ORDER — DIPHENHYDRAMINE HCL 25 MG PO TABS: 25.0000 mg | ORAL_TABLET | Freq: Four times a day (QID) | ORAL | Status: DC | PRN

## 2018-07-28 ENCOUNTER — Other Ambulatory Visit (HOSPITAL_COMMUNITY): Payer: Self-pay | Admitting: General Practice

## 2018-07-29 ENCOUNTER — Other Ambulatory Visit (HOSPITAL_COMMUNITY): Payer: Self-pay | Admitting: General Practice

## 2018-07-29 ENCOUNTER — Other Ambulatory Visit: Payer: Self-pay | Admitting: Neurology

## 2018-07-29 ENCOUNTER — Telehealth: Payer: Self-pay | Admitting: *Deleted

## 2018-07-29 ENCOUNTER — Encounter (HOSPITAL_COMMUNITY): Payer: Medicaid Other

## 2018-07-29 DIAGNOSIS — G35 Multiple sclerosis: Secondary | ICD-10-CM

## 2018-07-29 MED ORDER — ACETAMINOPHEN 325 MG PO TABS: 650.0000 mg | ORAL_TABLET | ORAL | Status: DC | PRN

## 2018-07-29 MED ORDER — SODIUM CHLORIDE 0.9 % IV SOLN: 600.0000 mg | Freq: Once | INTRAVENOUS | Status: DC

## 2018-07-29 MED ORDER — DIPHENHYDRAMINE HCL 25 MG PO CAPS: 50.0000 mg | ORAL_CAPSULE | ORAL | Status: DC | PRN

## 2018-07-29 NOTE — Telephone Encounter (Signed)
PA for Ocrevus approved by [facility] Medicaid [phone]).  Pt TS#[id number] O.  SP#[id number] P.  Approval dates: 07/29/2018 through 2/202/2021.  Diagnosed with MS in 05/2010.  Tysabri from 07/2010 to 05/2017.  JCV ab 2.88.  Changed to Ocrevus 07/2017.  Approval information faxed to [facility] at [phone].

## 2018-07-30 ENCOUNTER — Telehealth: Payer: Self-pay | Admitting: *Deleted

## 2018-07-30 MED FILL — OCREVUS 300 MG/10ML SOLN: 300 | 34 days supply | Qty: 20 | Fill #0

## 2018-07-30 NOTE — Telephone Encounter (Signed)
I called [place] [employer] [id number]) and spoke to K[place]er.  He has updated the Orevus quantity and it will now go through without any issues (call [phone]).  I called [employer] and spoke to Sterling.  He was able to get the medication processed.

## 2018-07-30 NOTE — Telephone Encounter (Signed)
She called stating that the PA for ocrevus did not go thru for the amount needed. 2 vials or .  She stated that PA was approved for 24ml.  I spoke to [place] at [employer] [phone].  She stated will need to be done again.

## 2018-08-12 ENCOUNTER — Other Ambulatory Visit: Payer: Self-pay | Admitting: Neurology

## 2018-08-27 ENCOUNTER — Other Ambulatory Visit (HOSPITAL_COMMUNITY): Payer: Self-pay | Admitting: General Practice

## 2018-09-03 ENCOUNTER — Ambulatory Visit (HOSPITAL_COMMUNITY): Payer: Medicaid Other

## 2018-09-14 ENCOUNTER — Encounter (HOSPITAL_COMMUNITY): Payer: Self-pay

## 2018-09-14 ENCOUNTER — Encounter (HOSPITAL_COMMUNITY)
Admission: RE | Admit: 2018-09-14 | Discharge: 2018-09-14 | Disposition: A | Discharge status: Home / Self Care | Payer: Medicaid Other | Source: Ambulatory Visit | Attending: Neurology | Admitting: Neurology

## 2018-09-14 ENCOUNTER — Other Ambulatory Visit: Payer: Self-pay

## 2018-09-14 DIAGNOSIS — G35 Multiple sclerosis: Secondary | ICD-10-CM | POA: Insufficient documentation

## 2018-09-14 MED ORDER — SODIUM CHLORIDE 0.9 % IV SOLN
Freq: Once | INTRAVENOUS | Status: AC
  Administered 2018-09-14: 08:00:00 via INTRAVENOUS

## 2018-09-14 MED ORDER — ACETAMINOPHEN 325 MG PO TABS
650.0000 mg | ORAL_TABLET | Freq: Once | ORAL | Status: AC
  Administered 2018-09-14: 08:00:00 650 mg via ORAL
  Filled 2018-09-14: qty 2

## 2018-09-14 MED ORDER — DIPHENHYDRAMINE HCL 50 MG/ML IJ SOLN
25.0000 mg | Freq: Once | INTRAMUSCULAR | Status: AC
  Administered 2018-09-14: 25 mg via INTRAVENOUS
  Filled 2018-09-14: qty 1

## 2018-09-14 MED ORDER — SODIUM CHLORIDE 0.9 % IV SOLN
600.0000 mg | Freq: Once | INTRAVENOUS | Status: AC
  Administered 2018-09-14: 09:00:00 600 mg via INTRAVENOUS
  Filled 2018-09-14: qty 20

## 2018-09-14 NOTE — Discharge Instructions (Signed)
Ocrelizumab injection What is this medicine? OCRELIZUMAB (ok re LIZ ue mab) treats multiple sclerosis. It helps to decrease the number of multiple sclerosis relapses. It is not a cure. This medicine may be used for other purposes; ask your health care provider or pharmacist if you have questions. COMMON BRAND NAME(S): OCREVUS What should I tell my health care provider before I take this medicine? They need to know if you have any of these conditions: -cancer -hepatitis B infection -other infection (especially a virus infection such as chickenpox, cold sores, or herpes) -an unusual or allergic reaction to ocrelizumab, other medicines, foods, dyes or preservatives -pregnant or trying to get pregnant -breast-feeding How should I use this medicine? This medicine is for infusion into a vein. It is given by a health care professional in a hospital or clinic setting. Talk to your pediatrician regarding the use of this medicine in children. Special care may be needed. Overdosage: If you think you have taken too much of this medicine contact a poison control center or emergency room at once. NOTE: This medicine is only for you. Do not share this medicine with others. What if I miss a dose? Keep appointments for follow-up doses as directed. It is important not to miss your dose. Call your doctor or health care professional if you are unable to keep an appointment. What may interact with this medicine? -alemtuzumab -daclizumab -dimethyl fumarate -fingolimod -glatiramer -interferon beta -live virus vaccines -mitoxantrone -natalizumab -peginterferon beta -rituximab -steroid medicines like prednisone or cortisone -teriflunomide This list may not describe all possible interactions. Give your health care provider a list of all the medicines, herbs, non-prescription drugs, or dietary supplements you use. Also tell them if you smoke, drink alcohol, or use illegal drugs. Some items may interact with  your medicine. What should I watch for while using this medicine? Tell your doctor or healthcare professional if your symptoms do not start to get better or if they get worse. This medicine can cause serious allergic reactions. To reduce your risk you may need to take medicine before treatment with this medicine. Take your medicine as directed. Women should inform their doctor if they wish to become pregnant or think they might be pregnant. There is a potential for serious side effects to an unborn child. Talk to your health care professional or pharmacist for more information. Female patients should use effective birth control methods while receiving this medicine and for 6 months after the last dose. Call your doctor or health care professional for advice if you get a fever, chills or sore throat, or other symptoms of a cold or flu. Do not treat yourself. This drug decreases your body's ability to fight infections. Try to avoid being around people who are sick. If you have a hepatitis B infection or a history of a hepatitis B infection, talk to your doctor. The symptoms of hepatitis B may get worse if you take this medicine. In some patients, this medicine may cause a serious brain infection that may cause death. If you have any problems seeing, thinking, speaking, walking, or standing, tell your doctor right away. If you cannot reach your doctor, urgently seek other source of medical care. This medicine can decrease the response to a vaccine. If you need to get vaccinated, tell your healthcare professional if you have received this medicine. Extra booster doses may be needed. Talk to your doctor to see if a different vaccination schedule is needed. Talk to your doctor about your risk of cancer.   You may be more at risk for certain types of cancers if you take this medicine. What side effects may I notice from receiving this medicine? Side effects that you should report to your doctor or health care  professional as soon as possible: -allergic reactions like skin rash, itching or hives, swelling of the face, lips, or tongue -breathing problems -facial flushing -fast, irregular heartbeat -lump or soreness in the breast -signs and symptoms of herpes such as cold sore, shingles, or genital sores -signs and symptoms of infection like fever or chills, cough, sore throat, pain or trouble passing urine -signs and symptoms of low blood pressure like dizziness; feeling faint or lightheaded, falls; unusually weak or tired -signs and symptoms of progressive multifocal leukoencephalopathy (PML) like changes in vision; clumsiness; confusion; personality changes; weakness on one side of the body -swelling of the ankles, feet, hands Side effects that usually do not require medical attention (report these to your doctor or health care professional if they continue or are bothersome): -back pain -depressed mood -diarrhea -pain, redness, or irritation at site where injected This list may not describe all possible side effects. Call your doctor for medical advice about side effects. You may report side effects to FDA at 1-800-FDA-1088. Where should I keep my medicine? This drug is given in a hospital or clinic and will not be stored at home. NOTE: This sheet is a summary. It may not cover all possible information. If you have questions about this medicine, talk to your doctor, pharmacist, or health care provider.  2019 Elsevier/Gold Standard (2015-09-05 09:40:25)  

## 2018-10-08 ENCOUNTER — Ambulatory Visit (HOSPITAL_COMMUNITY): Payer: Medicaid Other

## 2018-12-01 ENCOUNTER — Telehealth: Payer: Self-pay | Admitting: Adult Health

## 2018-12-01 NOTE — Telephone Encounter (Signed)
I called patient regarding rescheduling her 7/14 appointment due to it being at 7:30. Our office is currently not seeing patients before 8am. LVM requesting patient call back.

## 2018-12-15 ENCOUNTER — Ambulatory Visit: Payer: Medicaid Other | Admitting: Adult Health

## 2018-12-30 ENCOUNTER — Ambulatory Visit: Payer: Medicaid Other | Admitting: Neurology

## 2018-12-31 ENCOUNTER — Ambulatory Visit: Payer: Medicaid Other | Admitting: Neurology

## 2018-12-31 ENCOUNTER — Encounter: Payer: Self-pay | Admitting: Neurology

## 2018-12-31 ENCOUNTER — Other Ambulatory Visit: Payer: Self-pay

## 2018-12-31 ENCOUNTER — Other Ambulatory Visit: Payer: Self-pay | Admitting: *Deleted

## 2018-12-31 ENCOUNTER — Telehealth: Payer: Self-pay | Admitting: Neurology

## 2018-12-31 VITALS — BP 114/75 | HR 80 | Temp 97.3°F | Ht 64.0 in | Wt 203.0 lb

## 2018-12-31 DIAGNOSIS — G35 Multiple sclerosis: Secondary | ICD-10-CM | POA: Diagnosis not present

## 2018-12-31 DIAGNOSIS — R5383 Other fatigue: Secondary | ICD-10-CM | POA: Diagnosis not present

## 2018-12-31 DIAGNOSIS — R269 Unspecified abnormalities of gait and mobility: Secondary | ICD-10-CM | POA: Diagnosis not present

## 2018-12-31 MED ORDER — MODAFINIL 100 MG PO TABS: 100.0000 mg | ORAL_TABLET | Freq: Two times a day (BID) | ORAL | 5 refills | Status: DC

## 2018-12-31 MED ORDER — PROVIGIL 200 MG PO TABS: 200.0000 mg | ORAL_TABLET | Freq: Every day | ORAL | 5 refills | Status: DC

## 2018-12-31 NOTE — Progress Notes (Signed)
PATIENT: Julie MatesShanda D Pope DOB: 12-20-1980  REASON FOR VISIT: follow up HISTORY FROM: patient  HISTORY OF PRESENT ILLNESS:  She has history of hypertension, in May 18, 2010,while walking towards her car at the end of her working day, her legs give out underneath her, she fell face down on the concrete pavement at her Parking lot [place]front of [employer], knocked out her front teeth.   MRI of the brain,in May 19, 2010 without contrast, demonstrated diffuse white matter changes throughout the supratentorial and infratentorial region, with corpus callosum involvement, MRI scan of the cervical spine shows demyelinating enhancing plaques at C 3 on right and C 4-5 on left. consistent with MS   Repeat MRI scan of the brain in 2012, showed multiple brainstem, cerebellar, subcortical, corpus callosal and periventricular white matter lesions typical for demyelinating disease. Several T 1 black holes and enhancing lesions are seen bilaterally suggesting chronic and active disease. Significant progression and worsening compared with noncontrast MRI 05/19/10.  CSF showed elevated Ig G index, 9 ocb,extensive lab test for mimic were negative, including HIV, RPR, b12 b6, ACE lyme SSA, B, CBC, CMP, PEP, ana, copper, mild ele crp 6.5, and very low vit D<4.   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 In 05/17/13, index value was 1.17. Positive with titer of 1.6 9 in June 07 2014  UPDATE Jan 5th 2016: She was on long term disability for 2-3 years, now she is in the process of applying for social disability, waiting for the final decision. She still has mild gait difficulty,  muscle spasm, urinary urgency, occasional bowel urgency.  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 she started baclofen 10 mg 3 times a day since January fifth 2016, mild drowsiness, does help her lower extremity spasticity.  UPDATE March 22nd 2016: We have reviewed MRI of brain Feb 2016, stable, but moderate burden of round and ovoid, periventricular, subcortical, pericallosal, juxtacortical, midbrain, pontine, cerebellar and cervicomedullary junction chronic demyelinating plaques. Many of these are hypointense on T1 views. No abnormal lesions are seen on post contrast views. Compared to MRI on 12/02/13, no significant change.  She continues to have occasional falling episode, mild unsteady gait.  UPDATE May 19th 2016: [facility], She [facility] Mi[facility]UD [facility], also using condom contraceptives since 2011, I have emphasized the importance of contraception, she voiced understanding  UPDATE January 22 2016: She is here with her mother and daughter at today's clinical visit, she has slow worsening gait abnormality, dragging her right foot more, also noticed more clumsiness at her right hand since Nov 2016,   She is still taking baclofen 10 mg twice a day, she is receiving Tysarbri infusion every month at [facility] short stay, most recent infusion was January 19 2016, she tolerated the infusion well, no significant side effect noticed. She is still finding her disability,  UPDATE Oct 11th 2017: Laboratory evaluation showed negative anti-Tysarbri antibody,normal  CMP, CBC, significantly low vitamin D level 9.4, normal CBC.  She has received IV Solu-Medrol 3 days in September 2017 without significant improvement, We have personally  reviewed and compared to MRI scans, most recent September 2017, multiple T2/FLAIR hyperintensity foci within the brain stem, cerebellum, middle cerebellar peduncle, spinal cord periventricular, chest of cortical and deep white matter, no contrast enhancement, no significant change compared to 2016.  UPDATE Mar 13 2017: She has loose bowels, gait abnormality, gradually increase so she fell sometimes, her blood pressure fluctuate, she is receiving Tysarbri every month, Right leg dragging more.  JC-virus antibody was 2.88 positive, titer was trending up.  We have personally reviewed MRI of the brain with and without contrast in August in comparison to previous MRIs in 2017, extensive MS lesions, no contrast enhancement, no significant change  UPDATE Jun 11 2017: She continues to struggle with worsening gait abnormality, right side has more difficulty, her disability is denied again,  She is approved for ocrelizumab, last Tysabri infusion was May 30, 2017.  Reviewed laboratory evaluation normalor negative hepatitis B, C,TSH, CMP, Cbc, TB  UPDATE December 31 2018: She is accompanied by her mother at today's clinic visit, she continue has worsening gait abnormality, also developed bowel and bladder incontinence, have to wear depends, she has significant fatigue, lack of stamina, fell couple days ago,  I personally reviewed MRI with without contrast July 2019, multiple T2 flair lesions,  MRI of cervical spine September 2017: Multiple T2 hyperintensity foci within the spinal cord adjacent to C 1, C2, C3, C4-5, C5-6, no contrast-enhancement   REVIEW OF SYSTEMS: Out of a complete 14 system review of symptoms, the patient complains only of the following symptoms, and all other reviewed systems are negative. As above  ALLERGIES: No Known Allergies  HOME MEDICATIONS: Outpatient Medications Prior to Visit  Medication Sig Dispense Refill  . atenolol (TENORMIN) 50 MG tablet Take 1 tablet  (50 mg total) by mouth daily. 90 tablet 4  . calcium citrate (CALCITRATE - DOSED IN MG ELEMENTAL CALCIUM) 950 MG tablet Take 1 tablet by mouth daily.      . Cholecalciferol (VITAMIN D3) 1000 units CAPS Take by mouth daily.    Marland Kitchen. ibuprofen (ADVIL,MOTRIN) 800 MG tablet Take 800 mg by mouth as needed.    Marland Kitchen. lisinopril (PRINIVIL,ZESTRIL) 40 MG tablet TAKE 1/2 TABLET BY MOUTH EVERY DAY 30 tablet 5  . methocarbamol (ROBAXIN) 500 MG tablet Take 500 mg by mouth as needed.    Marland Kitchen. ocrelizumab (OCREVUS) 300 MG/10ML injection Infuse 600mg  every six months. 20 mL 0  . ocrelizumab 600 mg in sodium chloride 0.9 % 500 mL Inject 600 mg into the vein every 6 (six) months.    . sertraline (ZOLOFT) 25 MG tablet Take 1 tablet (25 mg total) by mouth daily. 30 tablet 5  . acetaminophen (TYLENOL) tablet 650 mg     . acetaminophen (TYLENOL) tablet 650 mg     . acetaminophen (TYLENOL) tablet 650 mg     . diphenhydrAMINE (BENADRYL) capsule 50 mg     . diphenhydrAMINE (BENADRYL) tablet 25 mg     . diphenhydrAMINE (BENADRYL) tablet 25 mg     . ocrelizumab (OCREVUS) 600 mg in sodium chloride 0.9 % 500 mL     . ocrelizumab (OCREVUS) 600 mg in sodium chloride 0.9 % 500 mL     . ocrelizumab (OCREVUS) 600 mg in sodium chloride 0.9 % 500 mL      No facility-administered medications prior to visit.  PAST MEDICAL HISTORY: Past Medical History:  Diagnosis Date  . Abnormality of gait   . Hypertension   . MS (multiple sclerosis) (HCC)   . Other nonspecific abnormal result of function study of brain and central nervous system   . Personal history of fall     PAST SURGICAL HISTORY: Past Surgical History:  Procedure Laterality Date  . CHOLECYSTECTOMY    . GALLBLADDER SURGERY      FAMILY HISTORY: Family History  Problem Relation Age of Onset  . Cancer Maternal Grandmother   . High blood pressure Other        Runs in both sides of the family    SOCIAL HISTORY: Social History   Socioeconomic History  . Marital  status: Single    Spouse name: Not on file  . Number of children: 2  . Years of education: 12  . Highest education level: Not o98n file  Occupational History  . Occupation: FILE Marine scientistCLERK    Employer: [employer]  Social Needs  . Financial resource strain: Not on file  . Food insecurity    Worry: Not on file    Inability: Not on file  . Transportation needs    Medical: Not on file    Non-medical: Not on file  Tobacco Use  . Smoking status: Never Smoker  . Smokeless tobacco: Never Used  Substance and Sexual Activity  . Alcohol use: No  . Drug use: No  . Sexual activity: Not on file  Lifestyle  . Physical activity    Days per week: Not on file    Minutes per session: Not on file  . Stress: Not on file  Relationships  . Social Musicianconnections    Talks on phone: Not on file    Gets together: Not on file    Attends religious service: Not on file    Active member of club or organization: Not on file    Attends meetings of clubs or organizations: Not on file    Relationship status: Not on file  . Intimate partner violence    Fear of current or ex partner: Not on file    Emotionally abused: Not on file    Physically abused: Not on file    Forced sexual activity: Not on file  Other Topics Concern  . Not on file  Social History Narrative   Patient has a high school education and two children. Patient is not currently working.   Patient is right-handed.   Patient drinks three cups of tea daily and two cans of soda daily.      PHYSICAL EXAM  Vitals:   12/31/18 0920  BP: 114/75  Pulse: 80  Temp: (!) 97.3 F (36.3 C)  Weight: 203 lb (92.1 kg)  Height: 5\' 4"  (1.626 m)   Body mass index is 34.84 kg/m.  PHYSICAL EXAMNIATION:  Gen: NAD, conversant, well nourised, obese, well groomed                     Cardiovascular: Regular rate rhythm, no peripheral edema, warm, nontender. Eyes: Conjunctivae clear without exudates or hemorrhage Neck: Supple, no carotid bruits.  Pulmonary: Clear to auscultation bilaterally   NEUROLOGICAL EXAM: Tired looking middle-aged female  MENTAL STATUS: Speech:    Speech is normal; fluent and spontaneous with normal comprehension.  Cognition:     Orientation to time, place and person     Normal recent and remote memory     Normal Attention span and concentration  Normal Language, naming, repeating,spontaneous speech     Fund of knowledge   CRANIAL NERVES:  CN II: Visual fields are full to confrontation.   Pupils are round equal and briskly reactive to light. CN III, IV, VI: extraocular movement are normal. No ptosis. CN V: Facial sensation is intact to pinprick in all 3 divisions bilaterally. Corneal responses are intact.  CN VII: Face is symmetric with normal eye closure and smile. CN VIII: Hearing is normal to rubbing fingers CN IX, X: Palate elevates symmetrically. Phonation is normal. CN XI: Head turning and shoulder shrug are intact CN XII: Tongue is midline with normal movements and no atrophy.  MOTOR: Mild spasticity of right upper and lower extremity, pronation of right upper extremity, fixation of right upper extremity upon rapid rotating movement, proximal and distal strength of right upper extremity 4 out of 5  She also has mild right proximal lower extremity weakness, right ankle dorsiflexion 3 out of 5  REFLEXES: Hyperreflexia of right upper and lower extremity, right side Babinski sign  SENSORY: Length dependent decreased light touch pinprick vibratory sensation  COORDINATION: Mild difficulty with right finger-to-nose, heel-to-shin, proportional to weakness  GAIT/STANCE: She needs to push on chair arm to get up from seated position, unsteady, dragging her right leg across the floor  DIAGNOSTIC DATA (LABS, IMAGING, TESTING) - I reviewed patient records, labs, notes, testing and imaging myself where available.  Lab Results  Component Value Date   WBC 7.2 06/15/2018   HGB 11.8 06/15/2018    HCT 36.8 06/15/2018   MCV 89 06/15/2018   PLT 321 06/15/2018      Component Value Date/Time   NA 139 06/15/2018 0802   K 4.4 06/15/2018 0802   CL 104 06/15/2018 0802   CO2 22 06/15/2018 0802   GLUCOSE 86 06/15/2018 0802   GLUCOSE 84 05/18/2010 1900   BUN 7 06/15/2018 0802   CREATININE 0.87 06/15/2018 0802   CALCIUM 9.1 06/15/2018 0802   PROT 6.8 06/15/2018 0802   ALBUMIN 4.1 06/15/2018 0802   AST 9 06/15/2018 0802   ALT 6 06/15/2018 0802   ALKPHOS 89 06/15/2018 0802   BILITOT 0.4 06/15/2018 0802   GFRNONAA 84 06/15/2018 0802   GFRAA 97 06/15/2018 0802    Lab Results  Component Value Date   TSH 0.815 06/11/2017      ASSESSMENT AND PLAN 38 y.o. year old female  Relapsing Remitting Multiple Sclerosis Progressive worsening gait abnormality Urinary and bowel incontinence Worsening fatigue  She has significant lesion load on MRI of the brain and cervical spine  Repeat MRI of the brain with without contrast  Refer to physical therapy  Laboratory evaluations including CD19 and CD20 B-cell, Ig G, IgM  Continue ocrelizumab,  Provigil 200mg  daily  Marcial Pacas, M.D. Ph.D.  [facility] [place], [place] [address] Phone: [phone] Fax:      [phone]

## 2018-12-31 NOTE — Telephone Encounter (Signed)
Modafinil 100mg  BID was sent to the pharmacy for the patient.  She has Medicaid and they only allow one tablet daily.  Also, name brand Provigil is preferred over modafinil.  Per vo by Dr. Krista Blue, okay to change prescription to Provigil 200mg , one tablet daily.  New rx sent to the pharmacy.  Modafinil rx previously sent has been voided.  PA was started and approved through [employer].  DU#[id number].  Valid through 12/31/2019.

## 2018-12-31 NOTE — Telephone Encounter (Signed)
Medicaid order sent to GI. They will obtain the auth and reach out to the patient to schedule.  

## 2018-12-31 NOTE — Patient Instructions (Signed)
[facility]  Orthotics & Prosthetics Service  [address]  [phone]  

## 2019-01-09 LAB — COMPREHENSIVE METABOLIC PANEL
ALT: 11 IU/L (ref 0–32)
AST: 13 IU/L (ref 0–40)
Albumin/Globulin Ratio: 1.6 (ref 1.2–2.2)
Albumin: 4.7 g/dL (ref 3.8–4.8)
Alkaline Phosphatase: 104 IU/L (ref 39–117)
BUN/Creatinine Ratio: 18 (ref 9–23)
BUN: 19 mg/dL (ref 6–20)
Bilirubin Total: 0.3 mg/dL (ref 0.0–1.2)
CO2: 26 mmol/L (ref 20–29)
Calcium: 9.7 mg/dL (ref 8.7–10.2)
Chloride: 98 mmol/L (ref 96–106)
Creatinine, Ser: 1.03 mg/dL — ABNORMAL HIGH (ref 0.57–1.00)
GFR calc Af Amer: 79 mL/min/{1.73_m2} (ref >59)
GFR calc non Af Amer: 69 mL/min/{1.73_m2} (ref >59)
Globulin, Total: 3 g/dL (ref 1.5–4.5)
Glucose: 96 mg/dL (ref 65–99)
Potassium: 4.5 mmol/L (ref 3.5–5.2)
Sodium: 139 mmol/L (ref 134–144)
Total Protein: 7.7 g/dL (ref 6.0–8.5)

## 2019-01-09 LAB — CBC WITH DIFFERENTIAL
Basophils Absolute: 0 10*3/uL (ref 0.0–0.2)
Basos: 0 %
EOS (ABSOLUTE): 0 10*3/uL (ref 0.0–0.4)
Eos: 0 %
Hematocrit: 34.6 % (ref 34.0–46.6)
Hemoglobin: 11.2 g/dL (ref 11.1–15.9)
Immature Grans (Abs): 0.1 10*3/uL (ref 0.0–0.1)
Immature Granulocytes: 1 %
Lymphocytes Absolute: 1 10*3/uL (ref 0.7–3.1)
Lymphs: 13 %
MCH: 29.5 pg (ref 26.6–33.0)
MCHC: 32.4 g/dL (ref 31.5–35.7)
MCV: 91 fL (ref 79–97)
Monocytes Absolute: 0.5 10*3/uL (ref 0.1–0.9)
Monocytes: 6 %
Neutrophils Absolute: 6.4 10*3/uL (ref 1.4–7.0)
Neutrophils: 80 %
RBC: 3.8 x10E6/uL (ref 3.77–5.28)
RDW: 13.2 % (ref 11.7–15.4)
WBC: 8 10*3/uL (ref 3.4–10.8)

## 2019-01-09 LAB — IGG, IGA, IGM
IgA/Immunoglobulin A, Serum: 425 mg/dL — ABNORMAL HIGH (ref 87–352)
IgG (Immunoglobin G), Serum: 1380 mg/dL (ref 586–1602)
IgM (Immunoglobulin M), Srm: 29 mg/dL (ref 26–217)

## 2019-01-09 LAB — TSH: TSH: 0.926 u[IU]/mL (ref 0.450–4.500)

## 2019-01-09 LAB — CD19 AND CD20, FLOW CYTOMETRY
% CD19: NEGATIVE %
% CD20: NEGATIVE %

## 2019-01-11 ENCOUNTER — Telehealth: Payer: Self-pay | Admitting: Neurology

## 2019-01-11 NOTE — Telephone Encounter (Signed)
Called, LVM for pt about results per Dr. Krista Blue note. Gave [facility] phone number if she has further questions but advised she did not have to call back otherwise.

## 2019-01-11 NOTE — Telephone Encounter (Signed)
Please call patient, laboratory evaluation showed no significant abnormalities. 

## 2019-01-12 ENCOUNTER — Other Ambulatory Visit: Payer: Self-pay

## 2019-01-12 ENCOUNTER — Ambulatory Visit: Payer: Medicaid Other | Attending: Neurology | Admitting: Physical Therapy

## 2019-01-12 DIAGNOSIS — M6281 Muscle weakness (generalized): Secondary | ICD-10-CM | POA: Diagnosis present

## 2019-01-12 DIAGNOSIS — R296 Repeated falls: Secondary | ICD-10-CM | POA: Diagnosis present

## 2019-01-12 DIAGNOSIS — R2689 Other abnormalities of gait and mobility: Secondary | ICD-10-CM

## 2019-01-12 DIAGNOSIS — R2681 Unsteadiness on feet: Secondary | ICD-10-CM

## 2019-01-12 NOTE — Therapy (Signed)
[facility] [address]  [address] [place], [place], [address] Phone: [phone]   Fax:  [phone]  Physical Therapy Evaluation  Patient Details  Name: Julie Pope MRN: [id number] Date of Birth: [birth year] Referring Provider (PT): Levert Feinstein, MD   Encounter Date: 01/12/2019  PT End of Session - 01/12/19 0845    Visit Number  1    Number of Visits  13    Date for PT Re-Evaluation  02/26/19    Authorization Type  Medicaid    PT Start Time  0845    PT Stop Time  0941    PT Time Calculation (min)  56 min    Equipment Utilized During Treatment  Gait belt    Activity Tolerance  Patient tolerated treatment well    Behavior During Therapy  [facility] for tasks assessed/performed       Past Medical History:  Diagnosis Date  . Abnormality of gait   . Hypertension   . MS (multiple sclerosis) (HCC)   . Other nonspecific abnormal result of function study of brain and central nervous system   . Personal history of fall     Past Surgical History:  Procedure Laterality Date  . CHOLECYSTECTOMY    . GALLBLADDER SURGERY      There were no vitals filed for this visit.   Subjective Assessment - 01/12/19 0849    Subjective  Still having pain in legs and still having drop foot - MD checking on Medicaid coverage for brace. Falls 2-3x/day on average even with RW. Requires assist to get up after a fall.    Limitations  Sitting;Standing;Walking;House hold activities;Lifting    How long can you sit comfortably?  10 minutes    How long can you stand comfortably?  10-15 minutes    How long can you walk comfortably?  10-15 minutes with RW    Patient Stated Goals  "To see if I can move the R foot better"    Currently in Pain?  Yes    Pain Score  6    8-9/10 on average   Pain Location  Calf    Pain Orientation  Right;Posterior    Pain Descriptors / Indicators  Sharp    Pain Type  Chronic pain    Pain Onset  More than a month ago   at  least 6 months   Pain Frequency  Intermittent    Aggravating Factors   laying on R side    Pain Relieving Factors  rubbing her leg, Tylenol    Effect of Pain on Daily Activities  falls more frequently         [facility] PT Assessment - 01/12/19 0845      Assessment   Medical Diagnosis  Multiple sclerosis    Referring Provider (PT)  Levert Feinstein, MD    Onset Date/Surgical Date  --   dx in ~2013   Hand Dominance  Right    Next MD Visit  05/03/2019    Prior Therapy  2 PT visits in January 2020      Precautions   Precautions  Fall      Restrictions   Weight Bearing Restrictions  No      Balance Screen   Has the patient fallen in the past 6 months  Yes    How many times?  2-3x/day on average    Has the patient had a decrease in activity level because of a fear of falling?  Yes    Is the patient reluctant to leave their home because of a fear of falling?   Yes      [place] residence    Living Arrangements  Parent;Children    Available Help at Discharge  Family    Type of [place] Access  Level entry    Home Layout  One level    [place] - 2 wheels;Cane - single point;Shower seat;Grab bars - tub/shower      Prior Function   Level of Independence  Needs assistance with ADLs;Independent with household mobility with device;Needs assistance with homemaking    Vocation  Unemployed    Leisure  mostly sedentary      Cognition   Overall Cognitive Status  Within Functional Limits for tasks assessed      Sensation   Light Touch  --   intermittent N/T in B tips of fingers and R foot     Coordination   Gross Motor Movements are Fluid and Coordinated  No   discontinuous movements     Posture/Postural Control   Posture/Postural Control  Postural limitations    Postural Limitations  Rounded Shoulders;Forward head      ROM / Strength   AROM / PROM / Strength  Strength      Strength   Overall Strength Comments  tested  in stting    Right Hip Flexion  4-/5    Right Hip Extension  3+/5    Right Hip ABduction  4/5    Right Hip ADduction  4/5    Left Hip Flexion  4/5    Left Hip Extension  3-/5    Left Hip ABduction  4+/5    Left Hip ADduction  4+/5    Right Knee Flexion  4-/5    Right Knee Extension  4-/5    Left Knee Flexion  4/5    Left Knee Extension  4+/5    Right Ankle Dorsiflexion  2/5    Right Ankle Plantar Flexion  2+/5    Left Ankle Dorsiflexion  4/5    Left Ankle Plantar Flexion  4/5      Flexibility   Soft Tissue Assessment /Muscle Length  yes   B gastrocs moderately tight     Palpation   Palpation comment  ttp over R medial/lateral gastroc muscle belly & lateral soleus      Ambulation/Gait   Assistive device  None;Rolling walker    Gait Pattern  Step-through pattern;Decreased hip/knee flexion - right;Decreased dorsiflexion - right;Poor foot clearance - right;Decreased step length - right;Decreased stride length;Trendelenburg   L hip drop   Ambulation Surface  Level;Indoor    Gait velocity  1.88 ft/sec w/o AD; 1.51 ft/sec with RW      Standardized Balance Assessment   Standardized Balance Assessment  Berg Balance Test;Timed Up and Go Test;Five Times Sit to Stand;10 meter walk test    Five times sit to stand comments   22.78 sec w/o UE assist    10 Meter Walk  17.41 sec w/o AD; 21.75 sec with RW      Berg Balance Test   Sit to Stand  Able to stand without using hands and stabilize independently    Standing Unsupported  Able to stand 2 minutes with supervision    Sitting with Back Unsupported but Feet Supported on Floor or Stool  Able to sit safely and securely 2 minutes  Stand to Sit  Controls descent by using hands    Transfers  Able to transfer safely, definite need of hands    Standing Unsupported with Eyes Closed  Able to stand 10 seconds with supervision    Standing Unsupported with Feet Together  Able to place feet together independently and stand for 1 minute with  supervision    From Standing, Reach Forward with Outstretched Arm  Can reach forward >12 cm safely (5")    From Standing Position, Pick up Object from Floor  Able to pick up shoe, needs supervision    From Standing Position, Turn to Look Behind Over each Shoulder  Needs assist to keep from losing balance and falling    Turn 360 Degrees  Needs close supervision or verbal cueing    Standing Unsupported, Alternately Place Feet on Step/Stool  Able to complete 4 steps without aid or supervision    Standing Unsupported, One Foot in Front  Able to take small step independently and hold 30 seconds    Standing on One Leg  Tries to lift leg/unable to hold 3 seconds but remains standing independently    Total Score  35    Berg comment:  < 36 high risk for falls (close to 100%)       Timed Up and Go Test   Normal TUG (seconds)  18.54   w/o AD; 33.19 sec with RW               Objective measurements completed on examination: See above findings.                PT Short Term Goals - 01/12/19 1008      PT SHORT TERM GOAL #1   Title  Patient to be independent with initial HEP    Status  New    Target Date  02/02/19      PT SHORT TERM GOAL #2   Title  Patient will demonstrate safe transfers with RW with </= 25% cues from PT    Status  New    Target Date  02/02/19        PT Long Term Goals - 01/12/19 1009      PT LONG TERM GOAL #1   Title  Patient to be independent with ongoing/advanced HEP    Status  New    Target Date  02/26/19      PT LONG TERM GOAL #2   Title  Patient to improve 5x STS by >/= 6 sec (16.8 sec) without use of UEs.    Status  New    Target Date  02/26/19      PT LONG TERM GOAL #3   Title  Patient to score <14 sec on TUG testing with LRAD to decrease risk of falls.     Status  New    Target Date  02/26/19      PT LONG TERM GOAL #4   Title  Patient to improve Berg to >/= 46/56 to reduce fall risk with daily tasks in home.    Status  New     Target Date  02/26/19      PT LONG TERM GOAL #5   Title  Patient to report >/= 50% reduction in frequency of falls at home.    Status  New    Target Date  02/26/19             Plan - 01/12/19 0941    Clinical Impression Statement  Lynford HumphreyShanda  is a 38 y/o female who presents to OP PT for abnormality of gait and fatigue associated with multiple sclerosis. She reports main concerns of R calf pain and muscle spasms, R  foot drop, and frequent falls, typically 2-3x/day on average, even with use of RW for most activity/ambulation.  She reports MD looking into Medicaid coverage for R AFO and anticipates she will be receiving a brace soon. Assessment reveals B LE weakness, R>L, with weakness impacting gait pattern and stability. High fall risk evident from standardized balance testing with Berg score of 35/56, TUG time of 18.54 sec w/o AD & 33.19 sec with RW, 5x STS time of 22.78 sec, and gait speeds of 1.88 ft/sec w/o AD and 1.51 ft/sec with RW. Decreased safety noted with hand placement and walker alignment during transfers with education provided to increase safe transitions and decrease risk for falls. Lynford HumphreyShanda will benefit from skilled PT to address pain, strength, coordination and balance deficits to increase safety and independence with mobility and gait.    Personal Factors and Comorbidities  Time since onset of injury/illness/exacerbation;Fitness;Past/Current Experience    Examination-Activity Limitations  Bathing;Caring for [employer];Carry;Locomotion Level;Squat;Stand;Transfers    Examination-Participation Restrictions  Community Activity;Meal Prep    Stability/Clinical Decision Making  Evolving/Moderate complexity    Clinical Decision Making  Moderate    Rehab Potential  Good    PT Frequency  2x / week    PT Duration  6 weeks    PT Treatment/Interventions  ADLs/Self Care Home Management;Cryotherapy;Electrical Stimulation;Functional mobility training;Stair training;Gait training;DME  Instruction;Ultrasound;Moist Heat;Therapeutic activities;Therapeutic exercise;Balance training;Neuromuscular re-education;Patient/family education;Orthotic Fit/Training;Passive range of motion;Manual techniques;Dry needling;Energy conservation;Splinting;Taping;Vasopneumatic Device;Vestibular    PT Next Visit Plan  Review & update/modify prior HEP as indicated    Consulted and Agree with Plan of Care  Patient       Patient will benefit from skilled therapeutic intervention in order to improve the following deficits and impairments:  Abnormal gait, Decreased coordination, Decreased activity tolerance, Decreased balance, Decreased endurance, Decreased mobility, Decreased range of motion, Decreased safety awareness, Decreased strength, Difficulty walking, Increased muscle spasms, Impaired flexibility, Impaired sensation, Improper body mechanics, Postural dysfunction, Pain  Visit Diagnosis: 1. Unsteadiness on feet   2. Other abnormalities of gait and mobility   3. Muscle weakness (generalized)   4. Repeated falls        Problem List Patient Active Problem List   Diagnosis Date Noted  . Other fatigue 12/31/2018  . Secondary hypertension 06/11/2017  . Essential hypertension 03/08/2015  . Encounter for therapeutic drug monitoring 03/17/2014  . Multiple sclerosis (HCC) 11/10/2012  . Personal history of fall   . Abnormality of gait   . Other nonspecific abnormal result of function study of brain and central nervous system     Marry GuanJoAnne M , PT, MPT 01/12/2019, 11:39 AM  [facility] [address]  [address] [place], [place], [address] Phone: [phone]   Fax:  [phone]  Name: Julie Pope MRN: [id number] Date of Birth: 1980/12/23

## 2019-01-15 ENCOUNTER — Ambulatory Visit: Payer: Medicaid Other

## 2019-01-19 ENCOUNTER — Other Ambulatory Visit: Payer: Self-pay

## 2019-01-19 ENCOUNTER — Ambulatory Visit: Payer: Medicaid Other

## 2019-01-19 DIAGNOSIS — R2689 Other abnormalities of gait and mobility: Secondary | ICD-10-CM

## 2019-01-19 DIAGNOSIS — R2681 Unsteadiness on feet: Secondary | ICD-10-CM

## 2019-01-19 DIAGNOSIS — R296 Repeated falls: Secondary | ICD-10-CM

## 2019-01-19 DIAGNOSIS — M6281 Muscle weakness (generalized): Secondary | ICD-10-CM

## 2019-01-19 NOTE — Therapy (Signed)
[facility] Outpatient Rehabilitation [facility] [address]  [address] [place], [place], [address] Phone: [phone]   Fax:  [phone]  Physical Therapy Treatment  Patient Details  Name: Julie Pope MRN: [id number] Date of Birth: 1980/10/19 Referring Provider (PT): Levert Feinstein, MD   Encounter Date: 01/19/2019  PT End of Session - 01/19/19 0938    Visit Number  2    Number of Visits  13    Date for PT Re-Evaluation  02/26/19    Authorization Type  Medicaid    Authorization Time Period  01/15/19 - 02/25/19    Authorization - Visit Number  1    Authorization - Number of Visits  12    PT Start Time  0931    PT Stop Time  1014    PT Time Calculation (min)  43 min    Equipment Utilized During Treatment  --    Activity Tolerance  Patient tolerated treatment well    Behavior During Therapy  [facility] for tasks assessed/performed       Past Medical History:  Diagnosis Date  . Abnormality of gait   . Hypertension   . MS (multiple sclerosis) (HCC)   . Other nonspecific abnormal result of function study of brain and central nervous system   . Personal history of fall     Past Surgical History:  Procedure Laterality Date  . CHOLECYSTECTOMY    . GALLBLADDER SURGERY      There were no vitals filed for this visit.  Subjective Assessment - 01/19/19 0936    Subjective  Pt. denies falls since eval.    Patient Stated Goals  "To see if I can move the R foot better"    Currently in Pain?  Yes    Pain Score  5     Pain Location  Calf    Pain Orientation  Right;Medial;Lower    Pain Descriptors / Indicators  Dull    Pain Type  Chronic pain    Pain Onset  More than a month ago    Pain Frequency  Constant    Multiple Pain Sites  No                       [facility] Adult PT Treatment/Exercise - 01/19/19 0001      Transfers   Transfers  Sit to Stand;Stand to Sit    Sit to Stand  5: Supervision    Sit to Stand Details  Verbal cues for technique;Verbal  cues for sequencing;Tactile cues for placement    Sit to Stand Details (indicate cue type and reason)  cues for proper hand placement for B UE push from seat, and RW spacing form BOS    Stand to Sit  5: Supervision    Stand to Sit Details (indicate cue type and reason)  Verbal cues for sequencing;Verbal cues for technique;Verbal cues for safe use of DME/AE    Stand to Sit Details  Cues required to "feel seated behind legs before sitting", cues required for RW spacing from seat      Knee/Hip Exercises: Stretches   Gastroc Stretch  Right;Left;1 rep;30 seconds    Gastroc Stretch Limitations  strap - cues for proper alignment, knee extension      Knee/Hip Exercises: Aerobic   Nustep  Lvl 1, 6 min (UE/LE)      Knee/Hip Exercises: Standing   Heel Raises  Both;10 reps    Heel Raises Limitations  in RW  Knee Flexion  Right;Left;10 reps;Strengthening    Knee Flexion Limitations  in RW     Hip Flexion  Right;Left;10 reps;Knee bent;Stengthening    Hip Flexion Limitations  cues for increased R hip/knee flexion       Knee/Hip Exercises: Seated   Long Arc Quad  Right;15 reps;1 set;Strengthening    Long Arc Quad Limitations  Cues for 3" hold time in TKE    Other Seated Knee/Hip Exercises  Seated DF x 10 reps     Sit to Sand  2 sets;5 reps;with UE support   with cueing for B UE pushoff from mat table to RW            PT Education - 01/19/19 1221    Education Details  HEP update; seated DF, seated LAQ, seated calf stretch, standing in RW: heel raise, hamstring curl, march, sit<>stand from chair with armrests (B UE pushoff)    Person(s) Educated  Patient    Methods  Explanation;Demonstration;Verbal cues;Handout    Comprehension  Verbalized understanding;Returned demonstration;Verbal cues required       PT Short Term Goals - 01/19/19 0939      PT SHORT TERM GOAL #1   Title  Patient to be independent with initial HEP    Status  On-going    Target Date  02/02/19      PT SHORT TERM  GOAL #2   Title  Patient will demonstrate safe transfers with RW with </= 25% cues from PT    Status  On-going    Target Date  02/02/19        PT Long Term Goals - 01/19/19 0939      PT LONG TERM GOAL #1   Title  Patient to be independent with ongoing/advanced HEP    Status  On-going      PT LONG TERM GOAL #2   Title  Patient to improve 5x STS by >/= 6 sec (16.8 sec) without use of UEs.    Status  On-going      PT LONG TERM GOAL #3   Title  Patient to score <14 sec on TUG testing with LRAD to decrease risk of falls.     Status  On-going      PT LONG TERM GOAL #4   Title  Patient to improve Berg to >/= 46/56 to reduce fall risk with daily tasks in home.    Status  On-going      PT LONG TERM GOAL #5   Title  Patient to report >/= 50% reduction in frequency of falls at home.    Status  On-going            Plan - 01/19/19 1226    Clinical Impression Statement  Nan seen to start session ambulating with RW and requiring cueing x two for proper hand placement and RW spacing with sit<>stand transfers.  Good carryover after cueing for proper technique for safe transfers and only requiring cueing x 1 with remainder of session.  Pt. reports no recent falls since last visit.  Session focused on formation and performance of initial LE strengthening and flexibility HEP with pt. issued handout to end session and verbalized understanding.  Will plan to further review HEP as indicated in coming session to check for understanding.    Personal Factors and Comorbidities  Time since onset of injury/illness/exacerbation;Fitness;Past/Current Experience    Examination-Activity Limitations  Bathing;Caring for [employer];Locomotion Level;Squat;Stand;Transfers    Rehab Potential  Good    PT Treatment/Interventions  ADLs/Self Care Home Management;Cryotherapy;Electrical Stimulation;Functional mobility training;Stair training;Gait training;DME Instruction;Ultrasound;Moist Heat;Therapeutic  activities;Therapeutic exercise;Balance training;Neuromuscular re-education;Patient/family education;Orthotic Fit/Training;Passive range of motion;Manual techniques;Dry needling;Energy conservation;Splinting;Taping;Vasopneumatic Device;Vestibular    PT Next Visit Plan  review Initial LE strengthening/flexibility HEP as indicated; further skilled instruction in proper/safe hand placement and RW spacing with transfers    Consulted and Agree with Plan of Care  Patient       Patient will benefit from skilled therapeutic intervention in order to improve the following deficits and impairments:  Abnormal gait, Decreased coordination, Decreased activity tolerance, Decreased balance, Decreased endurance, Decreased mobility, Decreased range of motion, Decreased safety awareness, Decreased strength, Difficulty walking, Increased muscle spasms, Impaired flexibility, Impaired sensation, Improper body mechanics, Postural dysfunction, Pain  Visit Diagnosis: 1. Unsteadiness on feet   2. Other abnormalities of gait and mobility   3. Muscle weakness (generalized)   4. Repeated falls        Problem List Patient Active Problem List   Diagnosis Date Noted  . Other fatigue 12/31/2018  . Secondary hypertension 06/11/2017  . Essential hypertension 03/08/2015  . Encounter for therapeutic drug monitoring 03/17/2014  . Multiple sclerosis (HCC) 11/10/2012  . Personal history of fall   . Abnormality of gait   . Other nonspecific abnormal result of function study of brain and central nervous system     Kermit BaloMicah Laquinn Shippy, PTA 01/19/19 12:33 PM    [facility] [address]  [address] [place], [place], [address] Phone: [phone]   Fax:  [phone]  Name: Julie Pope MRN: [id number] Date of Birth: 06-29-1980

## 2019-01-21 ENCOUNTER — Other Ambulatory Visit: Payer: Self-pay

## 2019-01-21 ENCOUNTER — Ambulatory Visit: Payer: Medicaid Other

## 2019-01-21 DIAGNOSIS — M6281 Muscle weakness (generalized): Secondary | ICD-10-CM

## 2019-01-21 DIAGNOSIS — R296 Repeated falls: Secondary | ICD-10-CM

## 2019-01-21 DIAGNOSIS — R2681 Unsteadiness on feet: Secondary | ICD-10-CM | POA: Diagnosis not present

## 2019-01-21 DIAGNOSIS — R2689 Other abnormalities of gait and mobility: Secondary | ICD-10-CM

## 2019-01-21 NOTE — Therapy (Signed)
[facility] [address]  [address] [place], [place], [address] Phone: [phone]   Fax:  [phone]  Physical Therapy Treatment  Patient Details  Name: Julie MatesShanda D Pope MRN: [id number] Date of Birth: [birth year] Referring Provider (PT): Levert FeinsteinYijun Yan, MD   Encounter Date: 01/21/2019  PT End of Session - 01/21/19 1711    Visit Number  3    Number of Visits  13    Date for PT Re-Evaluation  02/26/19    Authorization Type  Medicaid    Authorization Time Period  01/15/19 - 02/25/19    Authorization - Visit Number  2    Authorization - Number of Visits  12    PT Start Time  1658    PT Stop Time  1742    PT Time Calculation (min)  44 min    Activity Tolerance  Patient tolerated treatment well    Behavior During Therapy  [facility]/performed       Past Medical History:  Diagnosis Date  . Abnormality of gait   . Hypertension   . MS (multiple sclerosis) (HCC)   . Other nonspecific abnormal result of function study of brain and central nervous system   . Personal history of fall     Past Surgical History:  Procedure Laterality Date  . CHOLECYSTECTOMY    . GALLBLADDER SURGERY      There were no vitals filed for this visit.  Subjective Assessment - 01/21/19 1715    Subjective  Pt. doing well and denies recent falls.    Patient Stated Goals  "To see if I can move the R foot better"    Currently in Pain?  Yes    Pain Score  3     Pain Location  Ankle    Pain Orientation  Right;Medial;Lower    Pain Descriptors / Indicators  Dull    Pain Type  Chronic pain    Pain Frequency  Constant    Multiple Pain Sites  No                       [facility] Adult PT Treatment/Exercise - 01/21/19 0001      Transfers   Transfers  Sit to Stand;Stand to Sit    Sit to Stand  5: Supervision    Sit to Stand Details  Verbal cues for technique;Verbal cues for sequencing;Tactile cues for placement    Sit to Stand  Details (indicate cue type and reason)  Only 1 cue required for pt. hand placement on seat for UE pushoff rather than pulling up on RW; good RW spacing from BOS without cueing necessary     Stand to Sit  5: Supervision    Stand to Sit Details (indicate cue type and reason)  Verbal cues for technique    Stand to Sit Details  Only 1 cue required today for UE support on chair while lowering to sit; pt. no longer requiring cueing for BOS spacing from chair       Knee/Hip Exercises: Stretches   Gastroc Stretch  Right;Left;1 rep;30 seconds    Gastroc Stretch Limitations  strap - cues for proper alignment, knee extension      Knee/Hip Exercises: Aerobic   Nustep  Lvl 3, 6 min (UE/LE)      Knee/Hip Exercises: Standing   Heel Raises  Both   x 12 rpes    Heel Raises Limitations  in RW  Knee Flexion  Right;Left;10 reps;Strengthening   improved techinque without cueing required    Knee Flexion Limitations  in RW     Hip Flexion  Right;Left;10 reps;Knee bent;Stengthening    Hip Flexion Limitations  in RW   minor cueing x 1 for R LE march height      Knee/Hip Exercises: Seated   Long Arc Quad  Right;10 reps;Strengthening;Weights    Long Arc Quad Weight  1 lbs.    Long [employer] Limitations  Cues for 3" hold time in TKE    Other Seated Knee/Hip Exercises  Seated DF x 12 reps     Sit to Sand  10 reps;with UE support   no longer requiring cueing for proper hand placement              PT Short Term Goals - 01/19/19 0939      PT SHORT TERM GOAL #1   Title  Patient to be independent with initial HEP    Status  On-going    Target Date  02/02/19      PT SHORT TERM GOAL #2   Title  Patient will demonstrate safe transfers with RW with </= 25% cues from PT    Status  On-going    Target Date  02/02/19        PT Long Term Goals - 01/19/19 0939      PT LONG TERM GOAL #1   Title  Patient to be independent with ongoing/advanced HEP    Status  On-going      PT LONG TERM GOAL #2    Title  Patient to improve 5x STS by >/= 6 sec (16.8 sec) without use of UEs.    Status  On-going      PT LONG TERM GOAL #3   Title  Patient to score <14 sec on TUG testing with LRAD to decrease risk of falls.     Status  On-going      PT LONG TERM GOAL #4   Title  Patient to improve Berg to >/= 46/56 to reduce fall risk with daily tasks in home.    Status  On-going      PT LONG TERM GOAL #5   Title  Patient to report >/= 50% reduction in frequency of falls at home.    Status  On-going            Plan - 01/21/19 1712    Clinical Impression Statement  Julie Pope reporting she tried initial HEP x 1 since last session.  Did require cueing multiple times during HEP review for proper hold times and to maintain count of repetitions.  Pt. may require further review of proper technique with HEP in future visits.  Julie Pope did however demonstrate good carryover from previous sessions instruction for proper hand placement and RW spacing from chair and BOS with transfers today.  Sit<>stand transfers performed from mat table to RW with much improved safety awareness only requiring min cueing at times for UE placement.  Progressing well toward safe transfer STG.    Personal Factors and Comorbidities  Time since onset of injury/illness/exacerbation;Fitness;Past/Current Experience    Rehab Potential  Good    PT Treatment/Interventions  ADLs/Self Care Home Management;Cryotherapy;Electrical Stimulation;Functional mobility training;Stair training;Gait training;DME Instruction;Ultrasound;Moist Heat;Therapeutic activities;Therapeutic exercise;Balance training;Neuromuscular re-education;Patient/family education;Orthotic Fit/Training;Passive range of motion;Manual techniques;Dry needling;Energy conservation;Splinting;Taping;Vasopneumatic Device;Vestibular    PT Next Visit Plan  review Initial LE strengthening/flexibility HEP as indicated; further skilled instruction in proper/safe hand placement and RW spacing with  transfers    Consulted and Agree with Plan of Care  Patient       Patient will benefit from skilled therapeutic intervention in order to improve the following deficits and impairments:  Abnormal gait, Decreased coordination, Decreased activity tolerance, Decreased balance, Decreased endurance, Decreased mobility, Decreased range of motion, Decreased safety awareness, Decreased strength, Difficulty walking, Increased muscle spasms, Impaired flexibility, Impaired sensation, Improper body mechanics, Postural dysfunction, Pain  Visit Diagnosis: Unsteadiness on feet  Other abnormalities of gait and mobility  Muscle weakness (generalized)  Repeated falls     Problem List Patient Active Problem List   Diagnosis Date Noted  . Other fatigue 12/31/2018  . Secondary hypertension 06/11/2017  . Essential hypertension 03/08/2015  . Encounter for therapeutic drug monitoring 03/17/2014  . Multiple sclerosis (HCC) 11/10/2012  . Personal history of fall   . Abnormality of gait   . Other nonspecific abnormal result of function study of brain and central nervous system     Kermit BaloMicah Delonda Coley, PTA 01/21/19 6:15 PM    [facility] [place] [address]  [address] [place], [place], [address] Phone: [phone]   Fax:  [phone]  Name: Julie Pope MRN: [id number] Date of Birth: 01-10-1981

## 2019-01-26 ENCOUNTER — Other Ambulatory Visit: Payer: Self-pay

## 2019-01-26 ENCOUNTER — Encounter: Payer: Self-pay | Admitting: Physical Therapy

## 2019-01-26 ENCOUNTER — Ambulatory Visit: Payer: Medicaid Other | Admitting: Physical Therapy

## 2019-01-26 DIAGNOSIS — R296 Repeated falls: Secondary | ICD-10-CM

## 2019-01-26 DIAGNOSIS — R2689 Other abnormalities of gait and mobility: Secondary | ICD-10-CM

## 2019-01-26 DIAGNOSIS — R2681 Unsteadiness on feet: Secondary | ICD-10-CM

## 2019-01-26 DIAGNOSIS — M6281 Muscle weakness (generalized): Secondary | ICD-10-CM

## 2019-01-26 NOTE — Therapy (Signed)
[facility] [place] [facility] [address]  [place], [place], [address] Phone: [phone]   Fax:  [phone]  Physical Therapy Treatment  Patient Details  Name: Julie Pope MRN: [id number] Date of Birth: 1981/02/01 Referring Provider (PT): Marcial Pacas, MD   Encounter Date: 01/26/2019  PT End of Session - 01/26/19 1017    Visit Number  4    Number of Visits  13    Date for PT Re-Evaluation  02/26/19    Authorization Type  Medicaid    Authorization Time Period  01/15/19 - 02/25/19    Authorization - Visit Number  3    Authorization - Number of Visits  12    PT Start Time  1017    PT Stop Time  1101    PT Time Calculation (min)  44 min    Activity Tolerance  Patient tolerated treatment well    Behavior During Therapy  [facility] for tasks assessed/performed       Past Medical History:  Diagnosis Date  . Abnormality of gait   . Hypertension   . MS (multiple sclerosis) ([place])   . Other nonspecific abnormal result of function study of brain and central nervous system   . Personal history of fall     Past Surgical History:  Procedure Laterality Date  . CHOLECYSTECTOMY    . GALLBLADDER SURGERY      There were no vitals filed for this visit.  Subjective Assessment - 01/26/19 1021    Subjective  Pt reporting MD submitted info re: AFO to Medicaid on Monday - awaiting approval. Reports near miss for fall but able to catch herself on the bed.    Patient Stated Goals  "To see if I can move the R foot better"    Currently in Pain?  No/denies                       [facility] Adult PT Treatment/Exercise - 01/26/19 1017      Exercises   Exercises  Knee/Hip      Knee/Hip Exercises: Aerobic   Nustep  L3 x 6 min (UE/LE)      Knee/Hip Exercises: Standing   Hip Extension  Right;Left;10 reps;Knee straight;Stengthening    Extension Limitations  UE support on counter with slight hip/trunk flexion    Functional Squat  10 reps;3  seconds    Functional Squat Limitations  counter squat with chair for safety      Knee/Hip Exercises: Seated   Long Arc Quad  Both;10 reps;Strengthening    Long Arc Quad Limitations  looped yellow TB at ankles    Ball Squeeze  10 x 5"    Clamshell with TheraBand  Yellow   alt hip ABD/ER 10 x 3"   Marching  Both;10 reps;Strengthening    Marching Limitations  looped yellow TB at knees    Hamstring Curl  Right;Left;15 reps;Strengthening    Hamstring Limitations  yellow TB - cues to maintain neutral foot alignment, avoiding hip ER               PT Short Term Goals - 01/26/19 1101      PT SHORT TERM GOAL #1   Title  Patient to be independent with initial HEP    Status  Achieved    Target Date  02/02/19      PT SHORT TERM GOAL #2   Title  Patient will demonstrate safe transfers with RW with </=  25% cues from PT    Status  Achieved    Target Date  02/02/19        PT Long Term Goals - 01/19/19 0939      PT LONG TERM GOAL #1   Title  Patient to be independent with ongoing/advanced HEP    Status  On-going      PT LONG TERM GOAL #2   Title  Patient to improve 5x STS by >/= 6 sec (16.8 sec) without use of UEs.    Status  On-going      PT LONG TERM GOAL #3   Title  Patient to score <14 sec on TUG testing with LRAD to decrease risk of falls.     Status  On-going      PT LONG TERM GOAL #4   Title  Patient to improve Berg to >/= 46/56 to reduce fall risk with daily tasks in home.    Status  On-going      PT LONG TERM GOAL #5   Title  Patient to report >/= 50% reduction in frequency of falls at home.    Status  On-going            Plan - 01/26/19 1024    Clinical Impression Statement  Julie Pope much more consistent with safe transfers including safe approach to chair with RW and proper hand placement during transfers with RW - STG #2 met. She does still require minor cues for grip on RW and RW proximity during gait as she tends to hold onto front edge which puts her  too close to anterior cross bar to allow for normal stride. She denies any issues with HEP nor need for review (STG #1 met) but does note some fatigue by end of HEP completion at home. Progressed strengthening with addition of light resistance with seated exercises and progression of standing exercises targeting proximal stability, however no progression of HEP given patient reported fatigue with current HEP.    Rehab Potential  Good    PT Frequency  2x / week    PT Duration  6 weeks    PT Treatment/Interventions  ADLs/Self Care Home Management;Cryotherapy;Electrical Stimulation;Functional mobility training;Stair training;Gait training;DME Instruction;Ultrasound;Moist Heat;Therapeutic activities;Therapeutic exercise;Balance training;Neuromuscular re-education;Patient/family education;Orthotic Fit/Training;Passive range of motion;Manual techniques;Dry needling;Energy conservation;Splinting;Taping;Vasopneumatic Device;Vestibular    Consulted and Agree with Plan of Care  Patient       Patient will benefit from skilled therapeutic intervention in order to improve the following deficits and impairments:  Abnormal gait, Decreased coordination, Decreased activity tolerance, Decreased balance, Decreased endurance, Decreased mobility, Decreased range of motion, Decreased safety awareness, Decreased strength, Difficulty walking, Increased muscle spasms, Impaired flexibility, Impaired sensation, Improper body mechanics, Postural dysfunction, Pain  Visit Diagnosis: Unsteadiness on feet  Other abnormalities of gait and mobility  Muscle weakness (generalized)  Repeated falls     Problem List Patient Active Problem List   Diagnosis Date Noted  . Other fatigue 12/31/2018  . Secondary hypertension 06/11/2017  . Essential hypertension 03/08/2015  . Encounter for therapeutic drug monitoring 03/17/2014  . Multiple sclerosis ([place]) 11/10/2012  . Personal history of fall   . Abnormality of gait   . Other  nonspecific abnormal result of function study of brain and central nervous system     Percival Spanish, PT, MPT 01/26/2019, 11:18 AM  [facility] [address]  [place], [place], [address] Phone: [phone]   Fax:  [phone]  Name: Julie Pope  MRN: [id number] Date of Birth: May 31, 1981

## 2019-01-28 ENCOUNTER — Other Ambulatory Visit: Payer: Self-pay

## 2019-01-28 ENCOUNTER — Ambulatory Visit
Admission: RE | Admit: 2019-01-28 | Discharge: 2019-01-28 | Disposition: A | Discharge status: Home / Self Care | Payer: Medicaid Other | Source: Ambulatory Visit | Attending: Neurology | Admitting: Neurology

## 2019-01-28 DIAGNOSIS — G35 Multiple sclerosis: Secondary | ICD-10-CM | POA: Diagnosis not present

## 2019-01-28 DIAGNOSIS — R269 Unspecified abnormalities of gait and mobility: Secondary | ICD-10-CM

## 2019-01-28 DIAGNOSIS — R5383 Other fatigue: Secondary | ICD-10-CM

## 2019-01-28 MED ORDER — GADOBENATE DIMEGLUMINE 529 MG/ML IV SOLN
19.0000 mL | Freq: Once | INTRAVENOUS | Status: AC | PRN
  Administered 2019-01-28: 14:00:00 19 mL via INTRAVENOUS

## 2019-02-01 ENCOUNTER — Telehealth: Payer: Self-pay | Admitting: Neurology

## 2019-02-01 NOTE — Telephone Encounter (Signed)
Please call patient, MRI of brain and cervical card showed multiple lesions, consistent with Relapsing Remitting Multiple Sclerosis, no change compared to previous scan   IMPRESSION: Abnormal MRI scan of the brain showing multiple scattered periventricular, juxtacortical, pericallosal, pontine,cerebral peduncle and right cerebellar white matter hyperintensities on T2/flair in a pattern and distribution compatible with chronic demyelinating disease.  None of these show any postcontrast enhancement.  The presence of several T1 black holes and atrophy of corpus callosum and cortex indicates chronic disease.  Compared to previous MRI films from 12/25/2017 most of these are unchanged allowing for minor technical differences between the 2 scans.    IMPRESSION: This MRI of the cervical spine with and without contrast shows the following: 1. Multiple T2 hyperintense foci within the spinal cord adjacent to C1, C2, C3, C4-C5 and C5-C6 as detailed above consistent with chronic demyelinating plaque associated with multiple sclerosis. None of these foci enhanced after gadolinium contrast. 2. There are no significant degenerative changes and there is no nerve root impingement. 3. There is a normal enhancement pattern and there are no acute findings. 4.  No significant change compared with previous MRI C-spine dated 02/04/2016

## 2019-02-01 NOTE — Telephone Encounter (Signed)
Left patient a detailed message, with results, on voicemail (ok per DPR).  Provided our number to call back with any questions.  

## 2019-02-02 ENCOUNTER — Ambulatory Visit: Payer: Medicaid Other | Attending: Neurology | Admitting: Physical Therapy

## 2019-02-02 ENCOUNTER — Encounter: Payer: Self-pay | Admitting: Physical Therapy

## 2019-02-02 ENCOUNTER — Other Ambulatory Visit: Payer: Self-pay

## 2019-02-02 DIAGNOSIS — R2681 Unsteadiness on feet: Secondary | ICD-10-CM | POA: Diagnosis not present

## 2019-02-02 DIAGNOSIS — R296 Repeated falls: Secondary | ICD-10-CM

## 2019-02-02 DIAGNOSIS — M6281 Muscle weakness (generalized): Secondary | ICD-10-CM | POA: Insufficient documentation

## 2019-02-02 DIAGNOSIS — R2689 Other abnormalities of gait and mobility: Secondary | ICD-10-CM | POA: Insufficient documentation

## 2019-02-02 NOTE — Therapy (Addendum)
[facility] [place] [address]  [place], [place], [address] Phone: [phone]   Fax:  [phone]  Physical Therapy Treatment  Patient Details  Name: Julie Pope MRN: [id number] Date of Birth: 21-Aug-1980 Referring Provider (PT): Marcial Pacas, MD   Encounter Date: 02/02/2019  PT End of Session - 02/02/19 1022    Visit Number  5    Number of Visits  13    Date for PT Re-Evaluation  02/26/19    Authorization Type  Medicaid    Authorization Time Period  01/15/19 - 02/25/19    Authorization - Visit Number  4    Authorization - Number of Visits  12    PT Start Time  1022   Pt arrived late   PT Stop Time  1103    PT Time Calculation (min)  41 min    Activity Tolerance  Patient tolerated treatment well    Behavior During Therapy  [facility] for tasks assessed/performed       Past Medical History:  Diagnosis Date  . Abnormality of gait   . Hypertension   . MS (multiple sclerosis) ([place])   . Other nonspecific abnormal result of function study of brain and central nervous system   . Personal history of fall     Past Surgical History:  Procedure Laterality Date  . CHOLECYSTECTOMY    . GALLBLADDER SURGERY      There were no vitals filed for this visit.  Subjective Assessment - 02/02/19 1027    Subjective  Pt's mother reporting patient having new onset of cramps in R UE and LE starting a few days ago. Also has had one incidence where her leg gave way while getting out of bed causing her to land on her knee but denies injury.    Patient Stated Goals  "To see if I can move the R foot better"    Currently in Pain?  No/denies                       [facility] Adult PT Treatment/Exercise - 02/02/19 1022      Exercises   Exercises  Knee/Hip      Knee/Hip Exercises: Aerobic   Nustep  L4 x 6 min (UE/LE)      Knee/Hip Exercises: Standing   Functional Squat  10 reps;3 seconds    Functional Squat Limitations  counter  squat with chair for safety      Knee/Hip Exercises: Seated   Long Arc Quad  Both;10 reps;Strengthening    Long Arc Quad Limitations  looped yellow TB at ankles    Ball Squeeze  10 x 5"    Clamshell with TheraBand  Yellow   alt hip ABD/ER 10 x 3"   Marching  Both;10 reps;Strengthening    Marching Limitations  looped yellow TB at knees    Hamstring Curl  Right;Left;15 reps;Strengthening    Hamstring Limitations  yellow TB - cues to maintain neutral foot alignment, avoiding hip ER             PT Education - 02/02/19 1100    Education Details  HEP update - to be completed on alternating days with initial HEP    Person(s) Educated  Patient    Methods  Explanation;Demonstration;Handout    Comprehension  Verbalized understanding;Returned demonstration;Need further instruction       PT Short Term Goals - 02/02/19 1100      PT SHORT TERM  GOAL #1   Title  Patient to be independent with initial HEP    Status  Achieved      PT SHORT TERM GOAL #2   Title  Patient will demonstrate safe transfers with RW with </= 25% cues from PT    Status  Achieved        PT Long Term Goals - 01/19/19 0939      PT LONG TERM GOAL #1   Title  Patient to be independent with ongoing/advanced HEP    Status  On-going      PT LONG TERM GOAL #2   Title  Patient to improve 5x STS by >/= 6 sec (16.8 sec) without use of UEs.    Status  On-going      PT LONG TERM GOAL #3   Title  Patient to score <14 sec on TUG testing with LRAD to decrease risk of falls.     Status  On-going      PT LONG TERM GOAL #4   Title  Patient to improve Berg to >/= 46/56 to reduce fall risk with daily tasks in home.    Status  On-going      PT LONG TERM GOAL #5   Title  Patient to report >/= 50% reduction in frequency of falls at home.    Status  On-going            Plan - 02/02/19 1230    Clinical Impression Statement  Julie Pope and her mother reporting new onset of R UE and LE cramps over the past few days, not  localized to a specific muscle(s). She reports adequate hydration and is unable to associate cramp trigger(s) with specific activity. No cramps during therapy session. Question if cramping potentially related to overuse of muscles, therefore provided HEP update with alternative strengthening exercises to be completed on alternating days with initial HEP to vary muscle activation in effort to reduce cramping.      Rehab Potential  Good   PT Frequency  2x / week   PT Duration  6 weeks   PT Treatment/Interventions  ADLs/Self Care Home Management;Cryotherapy;Electrical Stimulation;Functional mobility training;Stair training;Gait training;DME Instruction;Ultrasound;Moist Heat;Therapeutic activities;Therapeutic exercise;Balance training;Neuromuscular re-education;Patient/family education;Orthotic Fit/Training;Passive range of motion;Manual techniques;Dry needling;Energy conservation;Splinting;Taping;Vasopneumatic Device;Vestibular   Consulted and Agree with Plan of Care  Patient      Patient will benefit from skilled therapeutic intervention in order to improve the following deficits and impairments:  Abnormal gait, Decreased coordination, Decreased activity tolerance, Decreased balance, Decreased endurance, Decreased mobility, Decreased range of motion, Decreased safety awareness, Decreased strength, Difficulty walking, Increased muscle spasms, Impaired flexibility, Impaired sensation, Improper body mechanics, Postural dysfunction, Pain  Visit Diagnosis: Unsteadiness on feet  Other abnormalities of gait and mobility  Muscle weakness (generalized)  Repeated falls     Problem List Patient Active Problem List   Diagnosis Date Noted  . Other fatigue 12/31/2018  . Secondary hypertension 06/11/2017  . Essential hypertension 03/08/2015  . Encounter for therapeutic drug monitoring 03/17/2014  . Multiple sclerosis (HCC) 11/10/2012  . Personal history of fall   . Abnormality of gait   . Other  nonspecific abnormal result of function study of brain and central nervous system     Marry Guan, PT, MPT 02/02/2019, 12:34 PM  [facility] [address]  [address] [place], [place], [address] Phone: [phone]   Fax:  [phone]  Name: Julie Pope MRN: [id number] Date of Birth: 01/15/1981

## 2019-02-04 ENCOUNTER — Ambulatory Visit: Payer: Medicaid Other | Admitting: Physical Therapy

## 2019-02-09 ENCOUNTER — Ambulatory Visit: Payer: Medicaid Other

## 2019-02-11 ENCOUNTER — Encounter: Payer: Self-pay | Admitting: Physical Therapy

## 2019-02-11 ENCOUNTER — Ambulatory Visit: Payer: Medicaid Other | Admitting: Physical Therapy

## 2019-02-11 ENCOUNTER — Other Ambulatory Visit: Payer: Self-pay

## 2019-02-11 DIAGNOSIS — R2681 Unsteadiness on feet: Secondary | ICD-10-CM

## 2019-02-11 DIAGNOSIS — R296 Repeated falls: Secondary | ICD-10-CM

## 2019-02-11 DIAGNOSIS — R2689 Other abnormalities of gait and mobility: Secondary | ICD-10-CM

## 2019-02-11 DIAGNOSIS — M6281 Muscle weakness (generalized): Secondary | ICD-10-CM

## 2019-02-11 NOTE — Therapy (Addendum)
[facility] [place] [address]  [address] [place], [place], [address] Phone: [phone]   Fax:  [phone]  Physical Therapy Treatment  Patient Details  Name: Julie MatesShanda D Pope MRN: [id number] Date of Birth: 1980/12/30 Referring Provider (PT): Levert FeinsteinYijun Yan, MD   Encounter Date: 02/11/2019  PT End of Session - 02/11/19 1020    Visit Number  6    Number of Visits  13    Date for PT Re-Evaluation  02/26/19    Authorization Type  Medicaid    Authorization Time Period  01/15/19 - 02/25/19    Authorization - Visit Number  5    Authorization - Number of Visits  12    PT Start Time  1020    PT Stop Time  1104    PT Time Calculation (min)  44 min    Equipment Utilized During Treatment  Gait belt    Activity Tolerance  Patient tolerated treatment well    Behavior During Therapy  WFL for tasks assessed/performed       Past Medical History:  Diagnosis Date  . Abnormality of gait   . Hypertension   . MS (multiple sclerosis) (HCC)   . Other nonspecific abnormal result of function study of brain and central nervous system   . Personal history of fall     Past Surgical History:  Procedure Laterality Date  . CHOLECYSTECTOMY    . GALLBLADDER SURGERY      There were no vitals filed for this visit.  Subjective Assessment - 02/11/19 1023    Subjective  Pt reporting cramping has gotten better and no further falls since lat visit.    Patient Stated Goals  "To see if I can move the R foot better"    Currently in Pain?  No/denies                       [facility]/Ex[facility] - 02/11/19 1020      Neuro Re-ed    Neuro Re-ed Details   Toe clears from Airex pad to 8" step x 20 (CGA of PT); Heel-toe weight shift on Airex pad x 20 (light HHA from PT)      Exercises   Exercises  Knee/Hip      Knee/Hip Exercises: Aerobic   Nustep  L4 x 6 min (UE/LE)      Knee/Hip Exercises: Seated   Long Arc Quad  Both;15  reps;Strengthening    Long Arc Quad Limitations  looped yellow TB at ankles - clarifying anchoring band under heel of stationary foot    Clamshell with TheraBand  Yellow   R/L hip ABD/ER 15 x 3" - completing full set for each side   Marching  Both;10 reps;Strengthening    Marching Limitations  looped yellow TB at knees    Hamstring Curl  Right;Left;15 reps;Strengthening    Hamstring Limitations  yellow TB - cues to maintain neutral foot alignment, avoiding hip ER          Balance Exercises - 02/11/19 1020      Balance Exercises: Standing   Standing Eyes Opened  Foam/compliant surface;Narrow base of support (BOS)   bean bag reach & toss   Marching Limitations  Marching in place on Airex pad x ~20; light HHA from PT          PT Short Term Goals - 02/02/19 1100      PT SHORT TERM GOAL #1  Title  Patient to be independent with initial HEP    Status  Achieved      PT SHORT TERM GOAL #2   Title  Patient will demonstrate safe transfers with RW with </= 25% cues from PT    Status  Achieved        PT Long Term Goals - 01/19/19 0939      PT LONG TERM GOAL #1   Title  Patient to be independent with ongoing/advanced HEP    Status  On-going      PT LONG TERM GOAL #2   Title  Patient to improve 5x STS by >/= 6 sec (16.8 sec) without use of UEs.    Status  On-going      PT LONG TERM GOAL #3   Title  Patient to score <14 sec on TUG testing with LRAD to decrease risk of falls.     Status  On-going      PT LONG TERM GOAL #4   Title  Patient to improve Berg to >/= 46/56 to reduce fall risk with daily tasks in home.    Status  On-going      PT LONG TERM GOAL #5   Title  Patient to report >/= 50% reduction in frequency of falls at home.    Status  On-going            Plan - 02/11/19 1104    Clinical Impression Statement  Julie Pope reporting no issues with initial HEP but requesting some review/clarification of most recent HEP update. Clarified yellow TB exercises  informing patient that either alternating legs or completing full set on one leg then switching to the opposite leg is acceptable for hip exercises. Also clarified positioning and anchoring of TB for LAQ and HS curls. Patient reporting better understanding of all exercises after review. Remainder of session incorporating balance and weight shifting activities to reduce fall risk with close guarding from PT.    Rehab Potential  Good    PT Frequency  2x / week    PT Duration  6 weeks    PT Treatment/Interventions  ADLs/Self Care Home Management;Cryotherapy;Electrical Stimulation;Functional mobility training;Stair training;Gait training;DME Instruction;Ultrasound;Moist Heat;Therapeutic activities;Therapeutic exercise;Balance training;Neuromuscular re-education;Patient/family education;Orthotic Fit/Training;Passive range of motion;Manual techniques;Dry needling;Energy conservation;Splinting;Taping;Vasopneumatic Device;Vestibular    PT Next Visit Plan  LE strengthening, balance training    Consulted and Agree with Plan of Care  Patient       Patient will benefit from skilled therapeutic intervention in order to improve the following deficits and impairments:  Abnormal gait, Decreased coordination, Decreased activity tolerance, Decreased balance, Decreased endurance, Decreased mobility, Decreased range of motion, Decreased safety awareness, Decreased strength, Difficulty walking, Increased muscle spasms, Impaired flexibility, Impaired sensation, Improper body mechanics, Postural dysfunction, Pain  Visit Diagnosis: Unsteadiness on feet  Other abnormalities of gait and mobility  Muscle weakness (generalized)  Repeated falls     Problem List Patient Active Problem List   Diagnosis Date Noted  . Other fatigue 12/31/2018  . Secondary hypertension 06/11/2017  . Essential hypertension 03/08/2015  . Encounter for therapeutic drug monitoring 03/17/2014  . Multiple sclerosis ([place]) 11/10/2012  .  Personal history of fall   . Abnormality of gait   . Other nonspecific abnormal result of function study of brain and central nervous system     Percival Spanish, PT, MPT 02/11/2019, 1:06 PM  [facility] [address]  [place], [place], [address] Phone: [phone]  Fax:  [phone]  Name: Julie Pope MRN: [id number] Date of Birth: 10/28/1980

## 2019-02-15 ENCOUNTER — Encounter: Payer: Self-pay | Admitting: Physical Therapy

## 2019-02-15 ENCOUNTER — Ambulatory Visit: Payer: Medicaid Other | Admitting: Physical Therapy

## 2019-02-15 ENCOUNTER — Other Ambulatory Visit: Payer: Self-pay

## 2019-02-15 DIAGNOSIS — R2681 Unsteadiness on feet: Secondary | ICD-10-CM

## 2019-02-15 DIAGNOSIS — R2689 Other abnormalities of gait and mobility: Secondary | ICD-10-CM

## 2019-02-15 DIAGNOSIS — M6281 Muscle weakness (generalized): Secondary | ICD-10-CM

## 2019-02-15 DIAGNOSIS — R296 Repeated falls: Secondary | ICD-10-CM

## 2019-02-15 NOTE — Therapy (Signed)
[facility] [place] [address]  [place], [place], [address] Phone: [phone]   Fax:  [phone]  Physical Therapy Treatment  Patient Details  Name: Julie Pope MRN: [id number] Date of Birth: Jul 12, 1980 Referring Provider (PT): Marcial Pacas, MD   Encounter Date: 02/15/2019  PT End of Session - 02/15/19 1459    Visit Number  7    Number of Visits  13    Date for PT Re-Evaluation  02/26/19    Authorization Type  Medicaid    Authorization Time Period  01/15/19 - 02/25/19    Authorization - Visit Number  6    Authorization - Number of Visits  12    PT Start Time  7902    PT Stop Time  1543    PT Time Calculation (min)  44 min    Equipment Utilized During Treatment  Gait belt    Activity Tolerance  Patient tolerated treatment well    Behavior During Therapy  [facility] for tasks assessed/performed       Past Medical History:  Diagnosis Date  . Abnormality of gait   . Hypertension   . MS (multiple sclerosis) ([place])   . Other nonspecific abnormal result of function study of brain and central nervous system   . Personal history of fall     Past Surgical History:  Procedure Laterality Date  . CHOLECYSTECTOMY    . GALLBLADDER SURGERY      There were no vitals filed for this visit.  Subjective Assessment - 02/15/19 1505    Subjective  Pt reporting 2 falls over the weekend while trying to work on sit <> stand exercise from HEP.    Patient Stated Goals  "To see if I can move the R foot better"    Currently in Pain?  No/denies                       [facility] Adult PT Treatment/Exercise - 02/15/19 1459      Exercises   Exercises  Knee/Hip      Knee/Hip Exercises: Aerobic   Nustep  L4 x 6 min (UE/LE)      Knee/Hip Exercises: Standing   Hip Flexion  Right;Left;10 reps;Knee bent;Stengthening    Hip Flexion Limitations  marching with looped yellow TB at midfoot; UE support on counter    Hip Abduction   Right;Left;10 reps;Knee straight;Stengthening    Abduction Limitations  looped yellow TB at ankles - cues for toes fwd to avoid hip ER; UE support on counter    Hip Extension  Right;Left;10 reps;Knee straight;Stengthening    Extension Limitations  looped yellow TB at ankles - cues for toes fwd and to avoid knee flexion;  UE support on counter    Functional Squat  15 reps;3 seconds    Functional Squat Limitations  counter squat with chair for safety          Balance Exercises - 02/15/19 1459      Balance Exercises: Standing   Standing Eyes Opened  Narrow base of support (BOS);Solid surface;30 secs;Head turns;5 reps   rhomberg stance   Standing Eyes Closed  Narrow base of support (BOS);Solid surface;10 secs    Partial Tandem Stance  Eyes open;30 secs;Intermittent upper extremity support;Cognitive challenge;5 reps;Eyes closed;10 secs          PT Short Term Goals - 02/02/19 1100      PT SHORT TERM GOAL #1   Title  Patient to be independent with initial HEP    Status  Achieved      PT SHORT TERM GOAL #2   Title  Patient will demonstrate safe transfers with RW with </= 25% cues from PT    Status  Achieved        PT Long Term Goals - 01/19/19 0939      PT LONG TERM GOAL #1   Title  Patient to be independent with ongoing/advanced HEP    Status  On-going      PT LONG TERM GOAL #2   Title  Patient to improve 5x STS by >/= 6 sec (16.8 sec) without use of UEs.    Status  On-going      PT LONG TERM GOAL #3   Title  Patient to score <14 sec on TUG testing with LRAD to decrease risk of falls.     Status  On-going      PT LONG TERM GOAL #4   Title  Patient to improve Berg to >/= 46/56 to reduce fall risk with daily tasks in home.    Status  On-going      PT LONG TERM GOAL #5   Title  Patient to report >/= 50% reduction in frequency of falls at home.    Status  On-going            Plan - 02/15/19 1509    Clinical Impression Statement  Lynford HumphreyShanda reporting 2 falls over  the weekend, both times while trying to work on sit <> stand with RW as part of HEP - deferred this exercise in favor of counter squats with chair for safety with patient able to perform these safely during review today. Added yellow TB resistance during standing exercises, however extensive cues necessary to avoid substitution, therefore no changes made to HEP. Progressed balance with introduction of corner balance activities with narrow and partial-tandem BOS with a few minor self-corrected LOB - will plan to address this further during PT before including such activities in HEP.    Rehab Potential  Good    PT Frequency  2x / week    PT Duration  6 weeks    PT Treatment/Interventions  ADLs/Self Care Home Management;Cryotherapy;Electrical Stimulation;Functional mobility training;Stair training;Gait training;DME Instruction;Ultrasound;Moist Heat;Therapeutic activities;Therapeutic exercise;Balance training;Neuromuscular re-education;Patient/family education;Orthotic Fit/Training;Passive range of motion;Manual techniques;Dry needling;Energy conservation;Splinting;Taping;Vasopneumatic Device;Vestibular    PT Next Visit Plan  LE strengthening, balance training    Consulted and Agree with Plan of Care  Patient       Patient will benefit from skilled therapeutic intervention in order to improve the following deficits and impairments:  Abnormal gait, Decreased coordination, Decreased activity tolerance, Decreased balance, Decreased endurance, Decreased mobility, Decreased range of motion, Decreased safety awareness, Decreased strength, Difficulty walking, Increased muscle spasms, Impaired flexibility, Impaired sensation, Improper body mechanics, Postural dysfunction, Pain  Visit Diagnosis: Unsteadiness on feet  Other abnormalities of gait and mobility  Muscle weakness (generalized)  Repeated falls     Problem List Patient Active Problem List   Diagnosis Date Noted  . Other fatigue 12/31/2018  .  Secondary hypertension 06/11/2017  . Essential hypertension 03/08/2015  . Encounter for therapeutic drug monitoring 03/17/2014  . Multiple sclerosis (HCC) 11/10/2012  . Personal history of fall   . Abnormality of gait   . Other nonspecific abnormal result of function study of brain and central nervous system     Marry GuanJoAnne M Xue Low, PT, MPT 02/15/2019, 3:56 PM  [facility] [place]  [address]  [address] [place], [place], [address] Phone: [phone]   Fax:  [phone]  Name: FLOREE LITWAK MRN: [id number] Date of Birth: [birth year]

## 2019-02-17 ENCOUNTER — Ambulatory Visit: Payer: Medicaid Other

## 2019-02-17 ENCOUNTER — Other Ambulatory Visit: Payer: Self-pay

## 2019-02-17 DIAGNOSIS — M6281 Muscle weakness (generalized): Secondary | ICD-10-CM

## 2019-02-17 DIAGNOSIS — R296 Repeated falls: Secondary | ICD-10-CM

## 2019-02-17 DIAGNOSIS — R2681 Unsteadiness on feet: Secondary | ICD-10-CM | POA: Diagnosis not present

## 2019-02-17 DIAGNOSIS — R2689 Other abnormalities of gait and mobility: Secondary | ICD-10-CM

## 2019-02-17 NOTE — Therapy (Signed)
[facility] [address]  [place], [place], [address] Phone: [phone]   Fax:  [phone]  Physical Therapy Treatment  Patient Details  Name: Julie Pope MRN: [id number] Date of Birth: 1980/06/26 Referring Provider (PT): Marcial Pacas, MD   Encounter Date: 02/17/2019  PT End of Session - 02/17/19 1113    Visit Number  8    Number of Visits  13    Date for PT Re-Evaluation  02/26/19    Authorization Type  Medicaid    Authorization Time Period  01/15/19 - 02/25/19    Authorization - Visit Number  7    Authorization - Number of Visits  12    PT Start Time  1100    PT Stop Time  1143    PT Time Calculation (min)  43 min    Equipment Utilized During Treatment  Gait belt    Activity Tolerance  Patient tolerated treatment well    Behavior During Therapy  WFL for tasks assessed/performed       Past Medical History:  Diagnosis Date  . Abnormality of gait   . Hypertension   . MS (multiple sclerosis) ([place])   . Other nonspecific abnormal result of function study of brain and central nervous system   . Personal history of fall     Past Surgical History:  Procedure Laterality Date  . CHOLECYSTECTOMY    . GALLBLADDER SURGERY      There were no vitals filed for this visit.  Subjective Assessment - 02/17/19 1112    Subjective  Pt. reporting she has not had falls since before last session.    Patient Stated Goals  "To see if I can move the R foot better"    Currently in Pain?  No/denies    Pain Score  0-No pain    Multiple Pain Sites  No                       [facility] Adult PT Treatment/Exercise - 02/17/19 0001      Knee/Hip Exercises: Aerobic   Nustep  L4 x 6 min (UE/LE)      Knee/Hip Exercises: Standing   Heel Raises  Both;15 reps    Heel Raises Limitations  UE support at RW and chair     Other Standing Knee Exercises  Side stepping with 2 HH assist from therapist and yellow TB at ankles 6 x  10 ft at mat table       Knee/Hip Exercises: Seated   Long Arc Quad  Both;15 reps;Strengthening    Long Arc Quad Limitations  looped yellow TB at ankles - clarifying anchoring band under heel of stationary foot    Other Seated Knee/Hip Exercises  B fitter leg press (1 blue 1 black band) x 10 rpes each way     Sit to Sand  10 reps;without UE support   from mat table to standing with close S from therapist          Balance Exercises - 02/17/19 1152      Balance Exercises: Standing   Standing Eyes Opened  Narrow base of support (BOS);Head turns;Solid surface;5 reps;20 secs    Partial Tandem Stance  Eyes open;30 secs;Intermittent upper extremity support;5 reps;Eyes closed;15 secs          PT Short Term Goals - 02/02/19 1100      PT SHORT TERM GOAL #1   Title  Patient to  be independent with initial HEP    Status  Achieved      PT SHORT TERM GOAL #2   Title  Patient will demonstrate safe transfers with RW with </= 25% cues from PT    Status  Achieved        PT Long Term Goals - 01/19/19 0939      PT LONG TERM GOAL #1   Title  Patient to be independent with ongoing/advanced HEP    Status  On-going      PT LONG TERM GOAL #2   Title  Patient to improve 5x STS by >/= 6 sec (16.8 sec) without use of UEs.    Status  On-going      PT LONG TERM GOAL #3   Title  Patient to score <14 sec on TUG testing with LRAD to decrease risk of falls.     Status  On-going      PT LONG TERM GOAL #4   Title  Patient to improve Berg to >/= 46/56 to reduce fall risk with daily tasks in home.    Status  On-going      PT LONG TERM GOAL #5   Title  Patient to report >/= 50% reduction in frequency of falls at home.    Status  On-going            Plan - 02/17/19 1118    Clinical Impression Statement  Julie Pope denies recent falls.  Tolerated addition of seated fitter leg press and side stepping with yellow TB resistance at ankles well today.  Does require cueing to avoid hip substitutions  at times with standing therex.  Progressed duration of narrow stance and staggered stance balance activities today with close supervision provided to pt. for safety.  Julie Pope with most difficulty maintaining balance with B staggered stance + eyes closed however no overt LOB or LE instability noted.  Reports she has adjusted performing sit<>stand at counter with UE support and chair behind her for safety.  Pt. will continue to benefit from skilled therapy services for improved functional strength and balance to reduce chance of falls and improve functional mobility.    Personal Factors and Comorbidities  Time since onset of injury/illness/exacerbation;Fitness;Past/Current Experience    Rehab Potential  Good    PT Treatment/Interventions  ADLs/Self Care Home Management;Cryotherapy;Electrical Stimulation;Functional mobility training;Stair training;Gait training;DME Instruction;Ultrasound;Moist Heat;Therapeutic activities;Therapeutic exercise;Balance training;Neuromuscular re-education;Patient/family education;Orthotic Fit/Training;Passive range of motion;Manual techniques;Dry needling;Energy conservation;Splinting;Taping;Vasopneumatic Device;Vestibular    PT Next Visit Plan  LE strengthening, balance training    Consulted and Agree with Plan of Care  Patient       Patient will benefit from skilled therapeutic intervention in order to improve the following deficits and impairments:  Abnormal gait, Decreased coordination, Decreased activity tolerance, Decreased balance, Decreased endurance, Decreased mobility, Decreased range of motion, Decreased safety awareness, Decreased strength, Difficulty walking, Increased muscle spasms, Impaired flexibility, Impaired sensation, Improper body mechanics, Postural dysfunction, Pain  Visit Diagnosis: Unsteadiness on feet  Other abnormalities of gait and mobility  Muscle weakness (generalized)  Repeated falls     Problem List Patient Active Problem List    Diagnosis Date Noted  . Other fatigue 12/31/2018  . Secondary hypertension 06/11/2017  . Essential hypertension 03/08/2015  . Encounter for therapeutic drug monitoring 03/17/2014  . Multiple sclerosis (HCC) 11/10/2012  . Personal history of fall   . Abnormality of gait   . Other nonspecific abnormal result of function study of brain and central nervous system  Kermit BaloMicah Jodine Muchmore, PTA 02/17/19 12:23 PM   [facility] [place] [address]  [address] [place], [place], [address] Phone: [phone]   Fax:  [phone]  Name: [name] MRN: [id number] Date of Birth: [birth year]

## 2019-02-23 ENCOUNTER — Ambulatory Visit: Payer: Medicaid Other | Admitting: Physical Therapy

## 2019-02-23 ENCOUNTER — Encounter: Payer: Self-pay | Admitting: Physical Therapy

## 2019-02-23 ENCOUNTER — Other Ambulatory Visit: Payer: Self-pay

## 2019-02-23 DIAGNOSIS — M6281 Muscle weakness (generalized): Secondary | ICD-10-CM

## 2019-02-23 DIAGNOSIS — R2681 Unsteadiness on feet: Secondary | ICD-10-CM | POA: Diagnosis not present

## 2019-02-23 DIAGNOSIS — R2689 Other abnormalities of gait and mobility: Secondary | ICD-10-CM

## 2019-02-23 DIAGNOSIS — R296 Repeated falls: Secondary | ICD-10-CM

## 2019-02-23 NOTE — Therapy (Addendum)
[facility] [address]  [place], [place], [address] Phone: [phone]   Fax:  [phone]  Physical Therapy Treatment / Discharge Summary  Patient Details  Name: Julie Pope MRN: [id number] Date of Birth: [birth year] Referring Provider (PT): Marcial Pacas, MD   Encounter Date: 02/23/2019  PT End of Session - 02/23/19 1448    Visit Number  9    Number of Visits  13    Date for PT Re-Evaluation  02/26/19    Authorization Type  Medicaid    Authorization Time Period  01/15/19 - 02/25/19    Authorization - Visit Number  8    Authorization - Number of Visits  12    PT Start Time  1448    PT Stop Time  1529    PT Time Calculation (min)  41 min    Equipment Utilized During Treatment  Gait belt    Activity Tolerance  Patient tolerated treatment well    Behavior During Therapy  [facility] for tasks assessed/performed       Past Medical History:  Diagnosis Date  . Abnormality of gait   . Hypertension   . MS (multiple sclerosis) ([facility])   . Other nonspecific abnormal result of function study of brain and central nervous system   . Personal history of fall     Past Surgical History:  Procedure Laterality Date  . CHOLECYSTECTOMY    . GALLBLADDER SURGERY      There were no vitals filed for this visit.  Subjective Assessment - 02/23/19 1456    Subjective  Pt reporting she has been trying to catch herself so she is not in a position where she might fall.    Patient Stated Goals  "To see if I can move the R foot better"    Currently in Pain?  No/denies                       [facility] Adult PT Treatment/Exercise - 02/23/19 1448      Exercises   Exercises  Knee/Hip      Knee/Hip Exercises: Aerobic   Nustep  L5 x 6 min (UE/LE)      Knee/Hip Exercises: Seated   Clamshell with TheraBand  Red   alt hip ABD/ER 10 x 3"   Marching  Both;10 reps;Strengthening    Marching Limitations  looped red TB at knees    Sit to Sand  10 reps;with UE support   feet on Airex pad with SBA from PT         Balance Exercises - 02/23/19 1448      Balance Exercises: Standing   Standing Eyes Opened  Foam/compliant surface;Wide (BOA);Narrow base of support (BOS);30 secs;2 reps;Cognitive challenge   ball toss   Partial Tandem Stance  Eyes open;Foam/compliant surface;30 secs;2 reps;Cognitive challenge   bsll toss     Balance Exercises: Seated   Dynamic Sitting  Eyes closed;No upper extremity support;Lower extremity support - 2;Reaching outside base of support   seated on dynadisc with feet on Airex pad + ball toss       PT Education - 02/23/19 1530    Education Details  HEP progression - progressed marching & seated clams to red TB    Person(s) Educated  Patient    Methods  Explanation;Demonstration    Comprehension  Verbalized understanding;Returned demonstration       PT Short Term Goals - 02/02/19 1100  PT SHORT TERM GOAL #1   Title  Patient to be independent with initial HEP    Status  Achieved      PT SHORT TERM GOAL #2   Title  Patient will demonstrate safe transfers with RW with </= 25% cues from PT    Status  Achieved        PT Long Term Goals - 02/23/19 1529      PT LONG TERM GOAL #1   Title  Patient to be independent with ongoing/advanced HEP    Status  Partially Met      PT LONG TERM GOAL #2   Title  Patient to improve 5x STS by >/= 6 sec (16.8 sec) without use of UEs.    Status  On-going      PT LONG TERM GOAL #3   Title  Patient to score <14 sec on TUG testing with LRAD to decrease risk of falls.     Status  On-going      PT LONG TERM GOAL #4   Title  Patient to improve Berg to >/= 46/56 to reduce fall risk with daily tasks in home.    Status  On-going      PT LONG TERM GOAL #5   Title  Patient to report >/= 50% reduction in frequency of falls at home.    Status  Partially Met            Plan - 02/23/19 1530    Clinical Impression Statement  Julie Pope  reporting no recent falls at home, feeling like she is better able to "catch" herself to keep from falling. She continues to report good tolerance for HEP and was able to progress resistance with seated marching and clams to red TB today (red band provided for home use). Much of today's session focusing on balance and proprioception incorporating uneven surfaces - close guarding provided by PT but no LOB requiring PT assist to correct witnessed.    Rehab Potential  Good    PT Frequency  2x / week    PT Duration  6 weeks    PT Treatment/Interventions  ADLs/Self Care Home Management;Cryotherapy;Electrical Stimulation;Functional mobility training;Stair training;Gait training;DME Instruction;Ultrasound;Moist Heat;Therapeutic activities;Therapeutic exercise;Balance training;Neuromuscular re-education;Patient/family education;Orthotic Fit/Training;Passive range of motion;Manual techniques;Dry needling;Energy conservation;Splinting;Taping;Vasopneumatic Device;Vestibular    PT Next Visit Plan  Medicaid reauthorization; LE strengthening, balance training    Consulted and Agree with Plan of Care  Patient       Patient will benefit from skilled therapeutic intervention in order to improve the following deficits and impairments:  Abnormal gait, Decreased coordination, Decreased activity tolerance, Decreased balance, Decreased endurance, Decreased mobility, Decreased range of motion, Decreased safety awareness, Decreased strength, Difficulty walking, Increased muscle spasms, Impaired flexibility, Impaired sensation, Improper body mechanics, Postural dysfunction, Pain  Visit Diagnosis: Unsteadiness on feet  Other abnormalities of gait and mobility  Muscle weakness (generalized)  Repeated falls     Problem List Patient Active Problem List   Diagnosis Date Noted  . Other fatigue 12/31/2018  . Secondary hypertension 06/11/2017  . Essential hypertension 03/08/2015  . Encounter for therapeutic drug  monitoring 03/17/2014  . Multiple sclerosis ([place]) 11/10/2012  . Personal history of fall   . Abnormality of gait   . Other nonspecific abnormal result of function study of brain and central nervous system     Percival Spanish, PT, MPT 02/23/2019, 6:46 PM  [facility] [address]  [place], [place], [address]  Phone: [phone]   Fax:  [phone]  Name: Julie Pope MRN: [id number] Date of Birth: 1980-11-06   PHYSICAL THERAPY DISCHARGE SUMMARY  Visits from Start of Care: 9  Current functional level related to goals / functional outcomes:   Refer to above clinical impression for status as of last visit on 02/23/2019. Patient was scheduled for recert and Medicaid reauthorization on 02/25/2019, but cancelled the appointment w/o reason given and did not reschedule within >30 days, therefore will proceed with discharge from PT for this episode.   Remaining deficits:   As above. Unable to formally assess status at discharge due to failure to return.   Education / Equipment:   HEP  Plan: Patient agrees to discharge.  Patient goals were partially met. Patient is being discharged due to not returning since the last visit.  ?????     Percival Spanish, PT, MPT 03/30/19, 9:21 AM  [facility] [address]  [place], [place], [address] Phone: [phone]   Fax:  [phone]

## 2019-02-25 ENCOUNTER — Ambulatory Visit: Payer: Medicaid Other | Admitting: Physical Therapy

## 2019-03-08 ENCOUNTER — Other Ambulatory Visit: Payer: Self-pay | Admitting: Neurology

## 2019-03-08 MED FILL — OCREVUS 300 MG/10ML SOLN: 300 | 34 days supply | Qty: 20 | Fill #0

## 2019-03-09 ENCOUNTER — Other Ambulatory Visit (HOSPITAL_COMMUNITY): Payer: Self-pay | Admitting: General Practice

## 2019-03-16 ENCOUNTER — Other Ambulatory Visit: Payer: Self-pay | Admitting: Neurology

## 2019-03-16 DIAGNOSIS — G35 Multiple sclerosis: Secondary | ICD-10-CM

## 2019-03-16 MED ORDER — METHYLPREDNISOLONE SODIUM SUCC 125 MG IJ SOLR: 125.0000 mg | Freq: Once | INTRAMUSCULAR | 0 refills | Status: AC

## 2019-03-16 MED ORDER — ACETAMINOPHEN 325 MG PO TABS: 650.0000 mg | ORAL_TABLET | Freq: Four times a day (QID) | ORAL | Status: DC | PRN

## 2019-03-16 MED ORDER — DIPHENHYDRAMINE HCL 25 MG PO CAPS: 25.0000 mg | ORAL_CAPSULE | ORAL | 0 refills | Status: DC | PRN

## 2019-03-16 MED ORDER — SODIUM CHLORIDE 0.9 % IV SOLN: 250.0000 mL | INTRAVENOUS | Status: DC

## 2019-03-16 NOTE — Progress Notes (Signed)
Order entered for NS, solumedrol, tylenol, benadryl as needed

## 2019-03-17 ENCOUNTER — Other Ambulatory Visit: Payer: Self-pay

## 2019-03-17 ENCOUNTER — Ambulatory Visit (HOSPITAL_COMMUNITY)
Admission: RE | Admit: 2019-03-17 | Discharge: 2019-03-17 | Disposition: A | Discharge status: Home / Self Care | Payer: Medicaid Other | Source: Ambulatory Visit | Attending: Neurology | Admitting: Neurology

## 2019-03-17 ENCOUNTER — Encounter (HOSPITAL_COMMUNITY): Payer: Self-pay

## 2019-03-17 DIAGNOSIS — G35 Multiple sclerosis: Secondary | ICD-10-CM | POA: Insufficient documentation

## 2019-03-17 MED ORDER — SODIUM CHLORIDE 0.9 % IV SOLN
INTRAVENOUS | Status: DC
  Administered 2019-03-17: 09:00:00 via INTRAVENOUS

## 2019-03-17 MED ORDER — SODIUM CHLORIDE 0.9 % IV SOLN
600.0000 mg | Freq: Once | INTRAVENOUS | Status: AC
  Administered 2019-03-17: 600 mg via INTRAVENOUS
  Filled 2019-03-17: qty 20

## 2019-03-17 MED ORDER — ACETAMINOPHEN 325 MG PO TABS
650.0000 mg | ORAL_TABLET | Freq: Once | ORAL | Status: AC
  Administered 2019-03-17: 650 mg via ORAL
  Filled 2019-03-17: qty 2

## 2019-03-17 MED ORDER — DIPHENHYDRAMINE HCL 50 MG/ML IJ SOLN
25.0000 mg | Freq: Once | INTRAMUSCULAR | Status: AC
  Administered 2019-03-17: 09:00:00 25 mg via INTRAVENOUS
  Filled 2019-03-17: qty 1

## 2019-03-17 NOTE — Discharge Instructions (Signed)
Ocrelizumab injection/Ocrevus Infusion  What is this medicine? OCRELIZUMAB (ok re LIZ ue mab) treats multiple sclerosis. It helps to decrease the number of multiple sclerosis relapses. It is not a cure. This medicine may be used for other purposes; ask your health care provider or pharmacist if you have questions. COMMON BRAND NAME(S): OCREVUS What should I tell my health care provider before I take this medicine? They need to know if you have any of these conditions:  cancer  hepatitis B infection  other infection (especially a virus infection such as chickenpox, cold sores, or herpes)  an unusual or allergic reaction to ocrelizumab, other medicines, foods, dyes or preservatives  pregnant or trying to get pregnant  breast-feeding How should I use this medicine? This medicine is for infusion into a vein. It is given by a health care professional in a hospital or clinic setting. Talk to your pediatrician regarding the use of this medicine in children. Special care may be needed. Overdosage: If you think you have taken too much of this medicine contact a poison control center or emergency room at once. NOTE: This medicine is only for you. Do not share this medicine with others. What if I miss a dose? Keep appointments for follow-up doses as directed. It is important not to miss your dose. Call your doctor or health care professional if you are unable to keep an appointment. What may interact with this medicine?  alemtuzumab  daclizumab  dimethyl fumarate  fingolimod  glatiramer  interferon beta  live virus vaccines  mitoxantrone  natalizumab  peginterferon beta  rituximab  steroid medicines like prednisone or cortisone  teriflunomide This list may not describe all possible interactions. Give your health care provider a list of all the medicines, herbs, non-prescription drugs, or dietary supplements you use. Also tell them if you smoke, drink alcohol, or use illegal  drugs. Some items may interact with your medicine. What should I watch for while using this medicine? Tell your doctor or healthcare professional if your symptoms do not start to get better or if they get worse. This medicine can cause serious allergic reactions. To reduce your risk you may need to take medicine before treatment with this medicine. Take your medicine as directed. Women should inform their doctor if they wish to become pregnant or think they might be pregnant. There is a potential for serious side effects to an unborn child. Talk to your health care professional or pharmacist for more information. Female patients should use effective birth control methods while receiving this medicine and for 6 months after the last dose. Call your doctor or health care professional for advice if you get a fever, chills or sore throat, or other symptoms of a cold or flu. Do not treat yourself. This drug decreases your body's ability to fight infections. Try to avoid being around people who are sick. If you have a hepatitis B infection or a history of a hepatitis B infection, talk to your doctor. The symptoms of hepatitis B may get worse if you take this medicine. In some patients, this medicine may cause a serious brain infection that may cause death. If you have any problems seeing, thinking, speaking, walking, or standing, tell your doctor right away. If you cannot reach your doctor, urgently seek other source of medical care. This medicine can decrease the response to a vaccine. If you need to get vaccinated, tell your healthcare professional if you have received this medicine. Extra booster doses may be needed. Talk  to your doctor to see if a different vaccination schedule is needed. Talk to your doctor about your risk of cancer. You may be more at risk for certain types of cancers if you take this medicine. What side effects may I notice from receiving this medicine? Side effects that you should  report to your doctor or health care professional as soon as possible:  allergic reactions like skin rash, itching or hives, swelling of the face, lips, or tongue  breathing problems  facial flushing  fast, irregular heartbeat  lump or soreness in the breast  signs and symptoms of herpes such as cold sore, shingles, or genital sores  signs and symptoms of infection like fever or chills, cough, sore throat, pain or trouble passing urine  signs and symptoms of low blood pressure like dizziness; feeling faint or lightheaded, falls; unusually weak or tired  signs and symptoms of progressive multifocal leukoencephalopathy (PML) like changes in vision; clumsiness; confusion; personality changes; weakness on one side of the body  swelling of the ankles, feet, hands Side effects that usually do not require medical attention (report these to your doctor or health care professional if they continue or are bothersome):  back pain  depressed mood  diarrhea  pain, redness, or irritation at site where injected This list may not describe all possible side effects. Call your doctor for medical advice about side effects. You may report side effects to FDA at 1-800-FDA-1088. Where should I keep my medicine? This drug is given in a hospital or clinic and will not be stored at home. NOTE: This sheet is a summary. It may not cover all possible information. If you have questions about this medicine, talk to your doctor, pharmacist, or health care provider.  2020 Elsevier/Gold Standard (2015-09-05 09:40:25)

## 2019-04-26 ENCOUNTER — Telehealth: Payer: Self-pay | Admitting: Neurology

## 2019-04-26 NOTE — Telephone Encounter (Signed)
lvm for pt to r/s 11/30 appt, please schedule with NP Judson Roch

## 2019-04-26 NOTE — Telephone Encounter (Signed)
FYI Pt called back, she wants to see Dr Krista Blue so she accepted her next available and is also on wait list, no call back requested.

## 2019-04-26 NOTE — Telephone Encounter (Signed)
The patient would like to see Dr. Krista Blue for her upcoming appt.  It will be left as originally scheduled on 05/03/2019.

## 2019-05-03 ENCOUNTER — Other Ambulatory Visit: Payer: Self-pay

## 2019-05-03 ENCOUNTER — Ambulatory Visit: Payer: Medicaid Other | Admitting: Neurology

## 2019-05-03 ENCOUNTER — Encounter: Payer: Self-pay | Admitting: Neurology

## 2019-05-03 VITALS — BP 114/78 | HR 83 | Temp 97.1°F | Ht 64.0 in | Wt 213.5 lb

## 2019-05-03 DIAGNOSIS — R159 Full incontinence of feces: Secondary | ICD-10-CM | POA: Diagnosis not present

## 2019-05-03 DIAGNOSIS — R269 Unspecified abnormalities of gait and mobility: Secondary | ICD-10-CM | POA: Diagnosis not present

## 2019-05-03 DIAGNOSIS — G35 Multiple sclerosis: Secondary | ICD-10-CM | POA: Diagnosis not present

## 2019-05-03 DIAGNOSIS — R32 Unspecified urinary incontinence: Secondary | ICD-10-CM | POA: Diagnosis not present

## 2019-05-03 DIAGNOSIS — G35D Multiple sclerosis, unspecified: Secondary | ICD-10-CM

## 2019-05-03 MED ORDER — SERTRALINE HCL 50 MG PO TABS: 100.0000 mg | ORAL_TABLET | Freq: Every day | ORAL | 11 refills | Status: DC

## 2019-05-03 NOTE — Progress Notes (Signed)
PATIENT: Julie Pope DOB: 01/09/1981  REASON FOR VISIT: follow up HISTORY FROM: patient  HISTORY OF PRESENT ILLNESS:  She has history of hypertension, in May 18, 2010,while walking towards her car at the end of her working day, her legs give out underneath her, she fell face down on the concrete pavement at her Parking lot infront of [employer], knocked out her front teeth.   MRI of the brain,in May 19, 2010 without contrast, demonstrated diffuse white matter changes throughout the supratentorial and infratentorial region, with corpus callosum involvement, MRI scan of the cervical spine shows demyelinating enhancing plaques at C 3 on right and C 4-5 on left. consistent with MS   Repeat MRI scan of the brain in 2012, showed multiple brainstem, cerebellar, subcortical, corpus callosal and periventricular white matter lesions typical for demyelinating disease. Several T 1 black holes and enhancing lesions are seen bilaterally suggesting chronic and active disease. Significant progression and worsening compared with noncontrast MRI 05/19/10.  CSF showed elevated Ig G index, 9 ocb,extensive lab test for mimic were negative, including HIV, RPR, b12 b6, ACE lyme SSA, B, CBC, CMP, PEP, ana, copper, mild ele crp 6.5, and very low vit D<4.   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 In 05/17/13, index value was 1.17. Positive with titer of 1.6 9 in June 07 2014  Over the years, repeat MRI of the brain and cervical spine showed significant lesion load, but there was no significant contrast-enhancement  Since 2017, she began to notice gradual worsening gait  abnormality, more weakness at right upper and lower extremity,  Had a trial of baclofen for lower extremity spasticity with increased fatigue, no significant help in a progressive gait abnormality.   She began to receive ocrelizumab infusion since February 2020 because of worsening gait abnormality, spasticity, in addition, she also complains of increased fatigue, bowel and bladder incontinence on a daily basis, developed right foot drop, has been relying on her walker since 2019, frequent falling, also complains of worsening depression,  I have personally reviewed most recent MRI of the brain, cervical spine in August 2020, multiple T2 hyperintensity foci within the spinal cord, adjacent to C1, C2, C3, C4-5, C5-6  MRI of brain showed multiple scattered periventricular, chest cortical, pericallosal, pontine, cerebral peduncle, right cerebellar white matter hyperintensity lesions, no contrast-enhancement, no significant change compared to previous scan in 2019,  Laboratory evaluation showed normal IgG 1380, IgA mildly elevated 425, normal IgM 29,CMP, creatinine was mildly elevated 1.03, nondetectable CD19 and 20 normal TSH, CBC,   REVIEW OF SYSTEMS: Out of a complete 14 system review of symptoms, the patient complains only of the following symptoms, and all other reviewed systems are negative. As above  ALLERGIES: No Known Allergies  HOME MEDICATIONS: Outpatient Medications Prior to Visit  Medication Sig Dispense Refill  . atenolol (TENORMIN) 50 MG tablet Take 1 tablet (50 mg total) by mouth daily. 90 tablet 4  . calcium citrate (CALCITRATE - DOSED IN MG ELEMENTAL CALCIUM) 950 MG tablet Take 1 tablet by mouth daily.      . Cholecalciferol (VITAMIN D3) 1000 units CAPS Take by mouth daily.    Marland Kitchen  diphenhydrAMINE (BENADRYL ALLERGY) 25 mg capsule Take 1 capsule (25 mg total) by mouth as needed for itching or allergies. 2 capsule 0  . ibuprofen (ADVIL,MOTRIN) 800 MG tablet Take 800 mg by mouth as  needed.    Marland Kitchen lisinopril (PRINIVIL,ZESTRIL) 40 MG tablet TAKE 1/2 TABLET BY MOUTH EVERY DAY 30 tablet 5  . methocarbamol (ROBAXIN) 500 MG tablet Take 500 mg by mouth as needed.    Marland Kitchen ocrelizumab 600 mg in sodium chloride 0.9 % 500 mL Inject 600 mg into the vein every 6 (six) months.    . OCREVUS 300 MG/10ML injection INFUSE 600MG  EVERY SIX MONTHS. 20 mL 0  . PROVIGIL 200 MG tablet Take 1 tablet (200 mg total) by mouth daily. Provigil preferred on [place] Medicaid drug list.  Approved.  NF#[id number].  Valid through 12/31/2019 30 tablet 5  . sertraline (ZOLOFT) 25 MG tablet Take 1 tablet (25 mg total) by mouth daily. 30 tablet 5  . 0.9 %  sodium chloride infusion     . acetaminophen (TYLENOL) tablet 650 mg      No facility-administered medications prior to visit.     PAST MEDICAL HISTORY: Past Medical History:  Diagnosis Date  . Abnormality of gait   . Hypertension   . MS (multiple sclerosis) ([place])   . Other nonspecific abnormal result of function study of brain and central nervous system   . Personal history of fall     PAST SURGICAL HISTORY: Past Surgical History:  Procedure Laterality Date  . CHOLECYSTECTOMY    . GALLBLADDER SURGERY      FAMILY HISTORY: Family History  Problem Relation Age of Onset  . Cancer Maternal Grandmother   . High blood pressure Other        Runs in both sides of the family    SOCIAL HISTORY: Social History   Socioeconomic History  . Marital status: Single    Spouse name: Not on file  . Number of children: 2  . Years of education: 59  . Highest education level: Not on file  Occupational History  . Occupation: FILE Armed forces operational officer: [employer]  Social Needs  . Financial resource strain: Not on file  . Food insecurity    Worry: Not on file    Inability: Not on file  . Transportation needs    Medical: Not on file    Non-medical: Not on file  Tobacco Use  . Smoking status: Never Smoker  . Smokeless tobacco: Never Used  Substance and  Sexual Activity  . Alcohol use: No  . Drug use: No  . Sexual activity: Not on file  Lifestyle  . Physical activity    Days per week: Not on file    Minutes per session: Not on file  . Stress: Not on file  Relationships  . Social Herbalist on phone: Not on file    Gets together: Not on file    Attends religious service: Not on file    Active member of club or organization: Not on file    Attends meetings of clubs or organizations: Not on file    Relationship status: Not on file  . Intimate partner violence    Fear of current or ex partner: Not on file    Emotionally abused: Not on file    Physically abused: Not on file    Forced sexual activity: Not on file  Other Topics Concern  . Not on file  Social History  Narrative   Patient has a high school education and two children. Patient is not currently working.   Patient is right-handed.   Patient drinks three cups of tea daily and two cans of soda daily.      PHYSICAL EXAM  Vitals:   05/03/19 1305  BP: 114/78  Pulse: 83  Temp: (!) 97.1 F (36.2 C)  Weight: 213 lb 8 oz (96.8 kg)  Height: 5\' 4"  (1.626 m)   Body mass index is 36.65 kg/m.  PHYSICAL EXAMNIATION:  Gen: NAD, conversant, well nourised, obese, well groomed                     Cardiovascular: Regular rate rhythm, no peripheral edema, warm, nontender. Eyes: Conjunctivae clear without exudates or hemorrhage Neck: Supple, no carotid bruits. Pulmonary: Clear to auscultation bilaterally   NEUROLOGICAL EXAM: Tired looking middle-aged female  MENTAL STATUS: Speech:    Speech is normal; fluent and spontaneous with normal comprehension.  Cognition:     Orientation to time, place and person     Normal recent and remote memory     Normal Attention span and concentration     Normal Language, naming, repeating,spontaneous speech     Fund of knowledge   CRANIAL NERVES:  CN II: Visual fields are full to confrontation.   Pupils are round equal and  briskly reactive to light. CN III, IV, VI: extraocular movement are normal. No ptosis. CN V: Facial sensation is intact to pinprick in all 3 divisions bilaterally. Corneal responses are intact.  CN VII: Face is symmetric with normal eye closure and smile. CN VIII: Hearing is normal to rubbing fingers CN IX, X: Palate elevates symmetrically. Phonation is normal. CN XI: Head turning and shoulder shrug are intact CN XII: Tongue is midline with normal movements and no atrophy.  MOTOR: Mild spasticity of right upper and lower extremity, pronation drift of right upper extremity, fixation of right upper extremity upon rapid rotating movement, proximal and distal strength of right upper extremity 4 out of 5  She also has mild right proximal lower extremity weakness, right ankle dorsiflexion 3 out of 5  REFLEXES: Hyperreflexia of right upper and lower extremity, right side Babinski sign  SENSORY: Length dependent decreased light touch pinprick and vibratory sensation to mid shin level  COORDINATION: Mild difficulty with right finger-to-nose, heel-to-shin, proportional to weakness  GAIT/STANCE: She needs to push on chair arm to get up from seated position, unsteady, dragging her right leg across the floor  DIAGNOSTIC DATA (LABS, IMAGING, TESTING) - I reviewed patient records, labs, notes, testing and imaging myself where available.  Lab Results  Component Value Date   WBC 8.0 12/31/2018   HGB 11.2 12/31/2018   HCT 34.6 12/31/2018   MCV 91 12/31/2018   PLT 321 06/15/2018      Component Value Date/Time   NA 139 12/31/2018 1235   K 4.5 12/31/2018 1235   CL 98 12/31/2018 1235   CO2 26 12/31/2018 1235   GLUCOSE 96 12/31/2018 1235   GLUCOSE 84 05/18/2010 1900   BUN 19 12/31/2018 1235   CREATININE 1.03 (H) 12/31/2018 1235   CALCIUM 9.7 12/31/2018 1235   PROT 7.7 12/31/2018 1235   ALBUMIN 4.7 12/31/2018 1235   AST 13 12/31/2018 1235   ALT 11 12/31/2018 1235   ALKPHOS 104 12/31/2018  1235   BILITOT 0.3 12/31/2018 1235   GFRNONAA 69 12/31/2018 1235   GFRAA 79 12/31/2018 1235    Lab Results  Component  Value Date   TSH 0.926 12/31/2018      ASSESSMENT AND PLAN 38 y.o. year old female  Relapsing Remitting Multiple Sclerosis since 2011  Tysabri infusion from February 2012 to December 2018  Ocrelizumab infusion since February 2019  I have personally reviewed most recent MRI of the brain, cervical spine in August 2020, multiple T2 hyperintensity foci within the spinal cord, adjacent to C1, C2, C3, C4-5, C5-6  MRI of brain showed multiple scattered periventricular, chest cortical, pericallosal, pontine, cerebral peduncle, right cerebellar white matter hyperintensity lesions, no contrast-enhancement, no significant change compared to previous scan in 2019,  She has significant lesion noted on both brain and cervical spine,   Progressive worsening gait abnormality   since 2017, right foot drop, requiring right ankle brace, rely on her walker  Worsening urinary and bowel incontinence  Accident on a daily basis  Worsening fatigue Worsening depression  Increase Zoloft to 100 mg daily     With her progressive clinical course, worsening gait abnormality, urinary and bowel incontinence, worsening fatigue, depression, I do not think she is suitable for any gainful employment,  Levert FeinsteinYijun Malayla Granberry, M.D. Ph.D.  [facility] [address] [place], [place] [address] Phone: [phone] Fax:      [phone]

## 2019-05-25 ENCOUNTER — Ambulatory Visit: Payer: Medicaid Other | Admitting: Neurology

## 2019-06-28 ENCOUNTER — Ambulatory Visit: Payer: Medicaid Other | Admitting: Neurology

## 2019-09-06 ENCOUNTER — Telehealth: Payer: Self-pay | Admitting: *Deleted

## 2019-09-06 ENCOUNTER — Other Ambulatory Visit: Payer: Self-pay | Admitting: Neurology

## 2019-09-06 NOTE — Telephone Encounter (Signed)
PA for Ocrevus pending approval by [facility] Medicaid 450 [id number]).  Case submitted on [facility] Tracks provider portal. Pt BS#[id number].   Diagnosed with MS in 05/2010.  Tysabri from 07/2010 to 05/2017.  JCV ab 2.88.  Changed to Ocrevus 07/2017.  Outcome will be faxed to [facility] at [phone].

## 2019-09-07 MED FILL — OCREVUS 300 MG/10ML SOLN: 300 | 34 days supply | Qty: 20 | Fill #0

## 2019-09-07 NOTE — Telephone Encounter (Signed)
PA approved from 09/07/19 through 09/01/20. KK#[id number]. Approval notification faxed and confirmed to [facility].

## 2019-09-15 ENCOUNTER — Other Ambulatory Visit: Payer: Self-pay | Admitting: Neurology

## 2019-09-15 NOTE — Progress Notes (Signed)
Ocrelizumb infusion order was placed on April 14h 2021

## 2019-09-16 ENCOUNTER — Ambulatory Visit (HOSPITAL_COMMUNITY)
Admission: RE | Admit: 2019-09-16 | Discharge: 2019-09-16 | Disposition: A | Discharge status: Home / Self Care | Payer: Medicaid Other | Source: Ambulatory Visit | Attending: Internal Medicine | Admitting: Internal Medicine

## 2019-09-16 ENCOUNTER — Other Ambulatory Visit: Payer: Self-pay

## 2019-09-16 DIAGNOSIS — G35 Multiple sclerosis: Secondary | ICD-10-CM | POA: Insufficient documentation

## 2019-09-16 MED ORDER — METHYLPREDNISOLONE SODIUM SUCC 125 MG IJ SOLR: 125.0000 mg | INTRAMUSCULAR | 0 refills | Status: DC | PRN

## 2019-09-16 MED ORDER — SODIUM CHLORIDE 0.9 % IV SOLN: 600.0000 mg | INTRAVENOUS | 5 refills | Status: AC

## 2019-09-16 MED ORDER — DIPHENHYDRAMINE HCL 25 MG PO CAPS
50.0000 mg | ORAL_CAPSULE | Freq: Four times a day (QID) | ORAL | Status: DC | PRN
  Administered 2019-09-16: 09:00:00 50 mg via ORAL
  Filled 2019-09-16: qty 2

## 2019-09-16 MED ORDER — ACETAMINOPHEN 500 MG PO TABS
500.0000 mg | ORAL_TABLET | Freq: Once | ORAL | Status: AC
  Administered 2019-09-16: 09:00:00 500 mg via ORAL
  Filled 2019-09-16: qty 1

## 2019-09-16 MED ORDER — SODIUM CHLORIDE 0.9 % IV SOLN
600.0000 mg | Freq: Once | INTRAVENOUS | Status: AC
  Administered 2019-09-16: 09:00:00 600 mg via INTRAVENOUS
  Filled 2019-09-16: qty 20

## 2019-09-16 MED ORDER — METHYLPREDNISOLONE SODIUM SUCC 125 MG IJ SOLR
125.0000 mg | Freq: Once | INTRAMUSCULAR | Status: AC
  Administered 2019-09-16: 09:00:00 125 mg via INTRAVENOUS
  Filled 2019-09-16: qty 2

## 2019-09-16 MED ORDER — DIPHENHYDRAMINE HCL 50 MG PO TABS: 50.0000 mg | ORAL_TABLET | ORAL | 0 refills | Status: DC | PRN

## 2019-09-16 MED ORDER — ACETAMINOPHEN 500 MG PO TABS: 500.0000 mg | ORAL_TABLET | ORAL | 0 refills | Status: DC | PRN

## 2019-09-16 NOTE — Progress Notes (Addendum)
0800 [facility]umb [facility]. Pt A&Ox4, ambulatory at time of arrival. Pt resting in recliner, VSS, IV started with NS at [facility]. Awaiting meds from pharmacy. WCTM.   5597 Patient pre-medicated with benadryl, solu-medrol, and tylenol. Awaiting ocrelizumb from pharmacy. WCTM.   4163 Ocrelizumb infusion started. Pt tolerating well, WCTM.   0940 Dose increased, pt tolerating well, WCTM.   1010 Pt still tolerating well, dose increased per MAR, WCTM.   1100 Pt assisted up to restroom, snack and drink provided, pt tolerating infusion well, WCTM.   1110 Rate increased per MAR, WCTM.   1200 Pt still tolerating infusion, no needs at this time, WCTM.  1315 Ocrelizumb infusion finished, post VSS. Pt refused to stay for monitoring post infusion, RN educated about s/s of reaction, pt verbalized understanding. IV removed without difficulty. RN reviewed discharge instructions with pt, pt understands without assistance. Pt A&Ox4, ambulatory at time of discharge from the [facility].

## 2019-09-16 NOTE — Discharge Instructions (Signed)
Ocrelizumab injection °What is this medicine? °OCRELIZUMAB (ok re LIZ ue mab) treats multiple sclerosis. It helps to decrease the number of multiple sclerosis relapses. It is not a cure. °This medicine may be used for other purposes; ask your health care provider or pharmacist if you have questions. °COMMON BRAND NAME(S): OCREVUS °What should I tell my health care provider before I take this medicine? °They need to know if you have any of these conditions: °· cancer °· hepatitis B infection °· other infection (especially a virus infection such as chickenpox, cold sores, or herpes) °· an unusual or allergic reaction to ocrelizumab, other medicines, foods, dyes or preservatives °· pregnant or trying to get pregnant °· breast-feeding °How should I use this medicine? °This medicine is for infusion into a vein. It is given by a health care professional in a hospital or clinic setting. °A special MedGuide will be given to you before each treatment. Be sure to read this information carefully each time. °Talk to your pediatrician regarding the use of this medicine in children. Special care may be needed. °Overdosage: If you think you have taken too much of this medicine contact a poison control center or emergency room at once. °NOTE: This medicine is only for you. Do not share this medicine with others. °What if I miss a dose? °Keep appointments for follow-up doses as directed. It is important not to miss your dose. Call your doctor or health care professional if you are unable to keep an appointment. °What may interact with this medicine? °· alemtuzumab °· daclizumab °· dimethyl fumarate °· fingolimod °· glatiramer °· interferon beta °· live virus vaccines °· mitoxantrone °· natalizumab °· peginterferon beta °· rituximab °· steroid medicines like prednisone or cortisone °· teriflunomide °This list may not describe all possible interactions. Give your health care provider a list of all the medicines, herbs,  non-prescription drugs, or dietary supplements you use. Also tell them if you smoke, drink alcohol, or use illegal drugs. Some items may interact with your medicine. °What should I watch for while using this medicine? °Tell your doctor or healthcare professional if your symptoms do not start to get better or if they get worse. °This medicine can cause serious allergic reactions. To reduce your risk you may need to take medicine before treatment with this medicine. Take your medicine as directed. °Women should inform their doctor if they wish to become pregnant or think they might be pregnant. There is a potential for serious side effects to an unborn child. Talk to your health care professional or pharmacist for more information. Female patients should use effective birth control methods while receiving this medicine and for 6 months after the last dose. °Call your doctor or health care professional for advice if you get a fever, chills or sore throat, or other symptoms of a cold or flu. Do not treat yourself. This drug decreases your body's ability to fight infections. Try to avoid being around people who are sick. °If you have a hepatitis B infection or a history of a hepatitis B infection, talk to your doctor. The symptoms of hepatitis B may get worse if you take this medicine. °In some patients, this medicine may cause a serious brain infection that may cause death. If you have any problems seeing, thinking, speaking, walking, or standing, tell your doctor right away. If you cannot reach your doctor, urgently seek other source of medical care. °This medicine can decrease the response to a vaccine. If you need to get   vaccinated, tell your healthcare professional if you have received this medicine. Extra booster doses may be needed. Talk to your doctor to see if a different vaccination schedule is needed. °Talk to your doctor about your risk of cancer. You may be more at risk for certain types of cancers if you  take this medicine. °What side effects may I notice from receiving this medicine? °Side effects that you should report to your doctor or health care professional as soon as possible: °· allergic reactions like skin rash, itching or hives, swelling of the face, lips, or tongue °· breathing problems °· facial flushing °· fast, irregular heartbeat °· lump or soreness in the breast °· signs and symptoms of herpes such as cold sore, shingles, or genital sores °· signs and symptoms of infection like fever or chills, cough, sore throat, pain or trouble passing urine °· signs and symptoms of low blood pressure like dizziness; feeling faint or lightheaded, falls; unusually weak or tired °· signs and symptoms of progressive multifocal leukoencephalopathy (PML) like changes in vision; clumsiness; confusion; personality changes; weakness on one side of the body °· swelling of the ankles, feet, hands °Side effects that usually do not require medical attention (report these to your doctor or health care professional if they continue or are bothersome): °· back pain °· depressed mood °· diarrhea °· pain, redness, or irritation at site where injected °This list may not describe all possible side effects. Call your doctor for medical advice about side effects. You may report side effects to FDA at 1-800-FDA-1088. °Where should I keep my medicine? °This drug is given in a hospital or clinic and will not be stored at home. °NOTE: This sheet is a summary. It may not cover all possible information. If you have questions about this medicine, talk to your doctor, pharmacist, or health care provider. °© 2020 [employer] (2018-05-25 07:41:53) ° °

## 2019-11-02 ENCOUNTER — Other Ambulatory Visit: Payer: Self-pay

## 2019-11-02 ENCOUNTER — Ambulatory Visit: Payer: Medicaid Other | Admitting: Neurology

## 2019-11-02 ENCOUNTER — Encounter: Payer: Self-pay | Admitting: Neurology

## 2019-11-02 VITALS — BP 110/75 | HR 81 | Ht 64.0 in | Wt 218.0 lb

## 2019-11-02 DIAGNOSIS — R269 Unspecified abnormalities of gait and mobility: Secondary | ICD-10-CM | POA: Diagnosis not present

## 2019-11-02 DIAGNOSIS — R5383 Other fatigue: Secondary | ICD-10-CM | POA: Diagnosis not present

## 2019-11-02 DIAGNOSIS — G35 Multiple sclerosis: Secondary | ICD-10-CM

## 2019-11-02 MED ORDER — PROVIGIL 200 MG PO TABS: 200.0000 mg | ORAL_TABLET | Freq: Every day | ORAL | 5 refills | Status: DC

## 2019-11-02 NOTE — Patient Instructions (Signed)
Check blood work today Send you for physical therapy  Continue current medications  See you back in 6 months

## 2019-11-02 NOTE — Progress Notes (Signed)
PATIENT: CHALEY Pope DOB: 1981/01/24  REASON FOR VISIT: follow up HISTORY FROM: patient  HISTORY OF PRESENT ILLNESS: Today 11/02/19  HISTORY She has history of hypertension, in May 18, 2010,while walking towards her car at the end of her working day, her legs give out underneath her, she fell face down on the concrete pavement at her Parking lot infront of [employer], knocked out her front teeth.   MRI of the brain,in May 19, 2010 without contrast, demonstrated diffuse white matter changes throughout the supratentorial and infratentorial region, with corpus callosum involvement, MRI scan of the cervical spine shows demyelinating enhancing plaques at C 3 on right and C 4-5 on left. consistent with MS   Repeat MRI scan of the brain in 2012, showed multiple brainstem, cerebellar, subcortical, corpus callosal and periventricular white matter lesions typical for demyelinating disease. Several T 1 black holes and enhancing lesions are seen bilaterally suggesting chronic and active disease. Significant progression and worsening compared with noncontrast MRI 05/19/10.  CSF showed elevated Ig G index, 9 ocb,extensive lab test for mimic were negative, including HIV, RPR, b12 b6, ACE lyme SSA, B, CBC, CMP, PEP, ana, copper, mild ele crp 6.5, and very low vit D<4.   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 In 05/17/13, index value was 1.17. Positive with titer of 1.6 9 in June 07 2014  Over the years, repeat MRI of the brain and cervical spine showed significant lesion load, but there was no significant contrast-enhancement  Since 2017, she began to  notice gradual worsening gait abnormality, more weakness at right upper and lower extremity,  Had a trial of baclofen for lower extremity spasticity with increased fatigue, no significant help in a progressive gait abnormality.   She began to receive ocrelizumab infusion since February 2020 because of worsening gait abnormality, spasticity, in addition, she also complains of increased fatigue, bowel and bladder incontinence on a daily basis, developed right foot drop, has been relying on her walker since 2019, frequent falling, also complains of worsening depression,  I have personally reviewed most recent MRI of the brain, cervical spine in August 2020,   multiple T2 hyperintensity foci within the spinal cord, adjacent to C1, C2, C3, C4-5, C5-6             MRI of brain showed multiple scattered periventricular, chest cortical, pericallosal, pontine, cerebral peduncle, right cerebellar white matter hyperintensity lesions, no contrast-enhancement, no significant change compared to previous scan in 2019,  Laboratory evaluation showed normal IgG 1380, IgA mildly elevated 425, normal IgM 29,CMP, creatinine was mildly elevated 1.03, nondetectable CD19 and 20 normal TSH, CBC,   Update November 02, 2019 SS: Here today with her mother, remains on Ocrevus, most recent infusion in February.  Continues to have difficulty with her gait, has had several falls since last seen.  Does not wear her right ankle brace consistently.  For a few months, complains of mid back pain, worse when lying down, unrelated to falls.  Has chronic daily urinary and bowel incontinence.  Is currently trying to get disability.  She lives with her mother.  No longer on Provigil, ran out of  refills, has reported more fatigue.  Uses walker.  Is on Zoloft for depression-higher dose seems to be helping.  REVIEW OF SYSTEMS: Out of a complete 14 system review of symptoms, the patient complains only of the following symptoms, and all other reviewed  systems are negative.  Numbness, fatigue  ALLERGIES: No Known Allergies  HOME MEDICATIONS: Outpatient Medications Prior to Visit  Medication Sig Dispense Refill  . acetaminophen (TYLENOL) 500 MG tablet Take 1 tablet (500 mg total) by mouth as needed. As needed for infusion reaction 30 tablet 0  . atenolol (TENORMIN) 50 MG tablet Take 1 tablet (50 mg total) by mouth daily. 90 tablet 4  . calcium citrate (CALCITRATE - DOSED IN MG ELEMENTAL CALCIUM) 950 MG tablet Take 1 tablet by mouth daily.      . Cholecalciferol (VITAMIN D3) 1000 units CAPS Take by mouth daily.    . diphenhydrAMINE (BENADRYL ALLERGY) 25 mg capsule Take 1 capsule (25 mg total) by mouth as needed for itching or allergies. 2 capsule 0  . diphenhydrAMINE (BENADRYL) 50 MG tablet Take 1 tablet (50 mg total) by mouth as needed. 30 minutes before ocrelizumab infusion 30 tablet 0  . ibuprofen (ADVIL,MOTRIN) 800 MG tablet Take 800 mg by mouth as needed.    Marland Kitchen lisinopril (PRINIVIL,ZESTRIL) 40 MG tablet TAKE 1/2 TABLET BY MOUTH EVERY DAY 30 tablet 5  . methocarbamol (ROBAXIN) 500 MG tablet Take 500 mg by mouth as needed.    . methylPREDNISolone sodium succinate (SOLU-MEDROL) 125 mg/2 mL injection Inject 2 mLs (125 mg total) into the vein as needed. 30 minutes before ocrelizumab infusion 1 each 0  . ocrelizumab 600 mg in sodium chloride 0.9 % 500 mL Inject 600 mg into the vein every 6 (six) months.    Marland Kitchen ocrelizumab 600 mg in sodium chloride 0.9 % 500 mL Inject 600 mg into the vein every 6 (six) months. 1 each 5  . OCREVUS 300 MG/10ML injection INFUSE 600MG  EVERY SIX MONTHS. 20 mL 0  . sertraline (ZOLOFT) 50 MG tablet Take 2 tablets (100 mg total) by mouth daily. 60 tablet 11  . PROVIGIL 200 MG tablet Take 1 tablet (200 mg total) by mouth daily. Provigil preferred on [place] Medicaid drug list.  Approved.  .  Valid through 12/31/2019 30 tablet 5   No facility-administered medications prior to visit.    PAST MEDICAL  HISTORY: Past Medical History:  Diagnosis Date  . Abnormality of gait   . Hypertension   . MS (multiple sclerosis) (HCC)   . Other nonspecific abnormal result of function study of brain and central nervous system   . Personal history of fall     PAST SURGICAL HISTORY: Past Surgical History:  Procedure Laterality Date  . CHOLECYSTECTOMY    . GALLBLADDER SURGERY      FAMILY HISTORY: Family History  Problem Relation Age of Onset  . Cancer Maternal Grandmother   . High blood pressure Other        Runs in both sides of the family    SOCIAL HISTORY: Social History   Socioeconomic History  . Marital status: Single    Spouse name: Not on file  . Number of children: 2  . Years of education: 50  . Highest education level: Not on file  Occupational History  . 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 Smoking status: Never Smoker  . Smokeless tobacco: Never Used  Substance and Sexual Activity  . Alcohol  use: No  . Drug use: No  . Sexual activity: Not on file  Other Topics Concern  . Not on file  Social History Narrative   Patient has a high school education and two children. Patient is not currently working.   Patient is right-handed.   Patient drinks three cups of tea daily and two cans of soda daily.   Social Determinants of Health   Financial Resource Strain:   . Difficulty of Paying Living Expenses:   Food Insecurity:   . Worried About Charity fundraiser in the Last Year:   . Arboriculturist in the Last Year:   Transportation Needs:   . Film/video editor (Medical):   Marland Kitchen Lack of Transportation (Non-Medical):   Physical Activity:   . Days of Exercise per Week:   . Minutes of Exercise per Session:   Stress:   . Feeling of Stress :   Social Connections:   . Frequency of Communication with Friends and Family:   . Frequency of Social Gatherings with Friends and Family:   . Attends Religious Services:   . Active Member of Clubs or  Organizations:   . Attends Archivist Meetings:   Marland Kitchen Marital Status:   Intimate Partner Violence:   . Fear of Current or Ex-Partner:   . Emotionally Abused:   Marland Kitchen Physically Abused:   . Sexually Abused:    PHYSICAL EXAM  Vitals:   11/02/19 1050  BP: 110/75  Pulse: 81  Weight: 218 lb (98.9 kg)  Height: 5\' 4"  (1.626 m)   Body mass index is 37.42 kg/m.  Generalized: Well developed, in no acute distress   Neurological examination  Mentation: Alert oriented to time, place, history taking. Follows all commands speech and language fluent Cranial nerve II-XII: Pupils were equal round reactive to light. Extraocular movements were full, visual field were full on confrontational test. Facial sensation and strength were normal.  Head turning and shoulder shrug  were normal and symmetric. Motor: 4/5 weakness right upper and lower extremity, right foot drop, right weaker grip strength compared to left Sensory: Sensory testing is intact to soft touch on all 4 extremities. No evidence of extinction is noted.  Coordination: Mild difficulty with coordination in the right and lower extremity, extremities are heavy feeling to her Gait and station: Has to push up on walker to stand, unsteady, dragging right leg Reflexes: Deep tendon reflexes are symmetric but brisk on the right  DIAGNOSTIC DATA (LABS, IMAGING, TESTING) - I reviewed patient records, labs, notes, testing and imaging myself where available.  Lab Results  Component Value Date   WBC 8.0 12/31/2018   HGB 11.2 12/31/2018   HCT 34.6 12/31/2018   MCV 91 12/31/2018   PLT 321 06/15/2018      Component Value Date/Time   NA 139 12/31/2018 1235   K 4.5 12/31/2018 1235   CL 98 12/31/2018 1235   CO2 26 12/31/2018 1235   GLUCOSE 96 12/31/2018 1235   GLUCOSE 84 05/18/2010 1900   BUN 19 12/31/2018 1235   CREATININE 1.03 (H) 12/31/2018 1235   CALCIUM 9.7 12/31/2018 1235   PROT 7.7 12/31/2018 1235   ALBUMIN 4.7 12/31/2018 1235    AST 13 12/31/2018 1235   ALT 11 12/31/2018 1235   ALKPHOS 104 12/31/2018 1235   BILITOT 0.3 12/31/2018 1235   GFRNONAA 69 12/31/2018 1235   GFRAA 79 12/31/2018 1235   No results found for: CHOL, HDL, LDLCALC, LDLDIRECT, TRIG, CHOLHDL No results  found for: HGBA1C No results found for: VITAMINB12 Lab Results  Component Value Date   TSH 0.926 12/31/2018      ASSESSMENT AND PLAN 39 y.o. year old female  has a past medical history of Abnormality of gait, Hypertension, MS (multiple sclerosis) (HCC), Other nonspecific abnormal result of function study of brain and central nervous system, and Personal history of fall. here with:  1.  Relapsing remitting multiple sclerosis since 2011 -Tysabri infusion from February 2012 to December 2018 -Continue Ocrevus since February 2019 -Most recent MRI of the brain and cervical spine in August 2020 showed multiple T2 hyperintensity foci within the spinal cord, adjacent to C1, C2, C3, C4-5, C5-6, MRI of the brain showed multiple scattered periventricular, chest cortical, pericallosal, pontine, cerebral peduncle, right cerebellar white matter hyperintensity lesions, no contrast-enhancement, no significant change compared to previous scan in 2019 -Significant lesion noted on brain and cervical spine -Check CBC, CMP, IgA, IgG, IgM today -Return in 6 months or sooner if needed with Dr. Terrace Arabia  2.  Progressive worsening gait abnormality -Since 2017, right foot drop, requiring right ankle brace, relies on her walker -Refer to physical therapy, possible gait problems may be contributing to thoracic back pain, holding off on imaging for now  3.  Worsening urinary and bowel incontinence -Accidents on daily basis  4.  Worsening fatigue -Restart Provigil 200 mg daily  5.  Worsening depression -Continue Zoloft 100 mg daily  I spent 30 minutes of face-to-face and non-face-to-face time with patient.  This included previsit chart review, lab review, study review,  order entry, electronic health record documentation, patient education.  Margie Ege, AGNP-C, DNP 11/02/2019, 11:42 AM [facility] [address], [address] [place], [place] [address] [phone]

## 2019-11-03 LAB — COMPREHENSIVE METABOLIC PANEL
ALT: 12 IU/L (ref 0–32)
AST: 17 IU/L (ref 0–40)
Albumin/Globulin Ratio: 1.4 (ref 1.2–2.2)
Albumin: 4.6 g/dL (ref 3.8–4.8)
Alkaline Phosphatase: 113 IU/L (ref 48–121)
BUN/Creatinine Ratio: 12 (ref 9–23)
BUN: 16 mg/dL (ref 6–24)
Bilirubin Total: 0.3 mg/dL (ref 0.0–1.2)
CO2: 23 mmol/L (ref 20–29)
Calcium: 9.5 mg/dL (ref 8.7–10.2)
Chloride: 99 mmol/L (ref 96–106)
Creatinine, Ser: 1.29 mg/dL — ABNORMAL HIGH (ref 0.57–1.00)
GFR calc Af Amer: 60 mL/min/{1.73_m2} (ref >59)
GFR calc non Af Amer: 52 mL/min/{1.73_m2} — ABNORMAL LOW (ref >59)
Globulin, Total: 3.3 g/dL (ref 1.5–4.5)
Glucose: 92 mg/dL (ref 65–99)
Potassium: 4.7 mmol/L (ref 3.5–5.2)
Sodium: 138 mmol/L (ref 134–144)
Total Protein: 7.9 g/dL (ref 6.0–8.5)

## 2019-11-03 LAB — CBC WITH DIFFERENTIAL/PLATELET
Basophils Absolute: 0 10*3/uL (ref 0.0–0.2)
Basos: 0 %
EOS (ABSOLUTE): 0.1 10*3/uL (ref 0.0–0.4)
Eos: 1 %
Hematocrit: 32.6 % — ABNORMAL LOW (ref 34.0–46.6)
Hemoglobin: 10.4 g/dL — ABNORMAL LOW (ref 11.1–15.9)
Immature Grans (Abs): 0.1 10*3/uL (ref 0.0–0.1)
Immature Granulocytes: 1 %
Lymphocytes Absolute: 1.3 10*3/uL (ref 0.7–3.1)
Lymphs: 13 %
MCH: 27.5 pg (ref 26.6–33.0)
MCHC: 31.9 g/dL (ref 31.5–35.7)
MCV: 86 fL (ref 79–97)
Monocytes Absolute: 0.8 10*3/uL (ref 0.1–0.9)
Monocytes: 8 %
Neutrophils Absolute: 8 10*3/uL — ABNORMAL HIGH (ref 1.4–7.0)
Neutrophils: 77 %
Platelets: 466 10*3/uL — ABNORMAL HIGH (ref 150–450)
RBC: 3.78 x10E6/uL (ref 3.77–5.28)
RDW: 13.5 % (ref 11.7–15.4)
WBC: 10.2 10*3/uL (ref 3.4–10.8)

## 2019-11-03 LAB — IGG, IGA, IGM
IgA/Immunoglobulin A, Serum: 376 mg/dL — ABNORMAL HIGH (ref 87–352)
IgG (Immunoglobin G), Serum: 1296 mg/dL (ref 586–1602)
IgM (Immunoglobulin M), Srm: 30 mg/dL (ref 26–217)

## 2019-11-22 DIAGNOSIS — Z0271 Encounter for disability determination: Secondary | ICD-10-CM

## 2019-11-30 ENCOUNTER — Ambulatory Visit: Payer: Medicaid Other

## 2020-03-07 ENCOUNTER — Other Ambulatory Visit: Payer: Self-pay | Admitting: Neurology

## 2020-03-17 ENCOUNTER — Ambulatory Visit (HOSPITAL_COMMUNITY)
Admission: RE | Admit: 2020-03-17 | Discharge: 2020-03-17 | Disposition: A | Discharge status: Home / Self Care | Payer: Medicaid Other | Source: Ambulatory Visit | Attending: Internal Medicine | Admitting: Internal Medicine

## 2020-03-17 ENCOUNTER — Other Ambulatory Visit: Payer: Self-pay

## 2020-03-17 DIAGNOSIS — G35 Multiple sclerosis: Secondary | ICD-10-CM | POA: Insufficient documentation

## 2020-03-17 MED ORDER — ACETAMINOPHEN 500 MG PO TABS
500.0000 mg | ORAL_TABLET | Freq: Once | ORAL | Status: AC
  Administered 2020-03-17: 500 mg via ORAL
  Filled 2020-03-17: qty 1

## 2020-03-17 MED ORDER — SODIUM CHLORIDE 0.9 % IV SOLN
600.0000 mg | INTRAVENOUS | Status: DC
  Administered 2020-03-17: 600 mg via INTRAVENOUS
  Filled 2020-03-17: qty 20

## 2020-03-17 MED ORDER — SODIUM CHLORIDE 0.9 % IV SOLN
INTRAVENOUS | Status: DC | PRN
  Administered 2020-03-17: 250 mL via INTRAVENOUS

## 2020-03-17 MED ORDER — DIPHENHYDRAMINE HCL 25 MG PO CAPS
50.0000 mg | ORAL_CAPSULE | Freq: Once | ORAL | Status: AC
  Administered 2020-03-17: 50 mg via ORAL
  Filled 2020-03-17: qty 2

## 2020-03-17 MED ORDER — METHYLPREDNISOLONE SODIUM SUCC 125 MG IJ SOLR
125.0000 mg | Freq: Once | INTRAMUSCULAR | Status: AC
  Administered 2020-03-17: 125 mg via INTRAVENOUS
  Filled 2020-03-17: qty 2

## 2020-03-17 NOTE — Progress Notes (Signed)
PATIENT CARE CENTER NOTE  Diagnosis: Multiple Sclerosis    Provider: Levert Feinstein, MD   Procedure: Ocrevus 600 mg infusion    Note: Patient received Ocrevus infusion via PIV. Pre-medications given (Tylenol, Benadryl and Solu-medrol) per order. Infusion titrated per protocol. Patient tolerated well with no adverse reaction. Observed patient for 1 hour post-infusion. Vital signs remained stable. Discharge instructions given. Patient to come back every 6 months for infusion. Alert, oriented and ambulatory at discharge.

## 2020-03-17 NOTE — Discharge Instructions (Signed)
Ocrelizumab injection °What is this medicine? °OCRELIZUMAB (ok re LIZ ue mab) treats multiple sclerosis. It helps to decrease the number of multiple sclerosis relapses. It is not a cure. °This medicine may be used for other purposes; ask your health care provider or pharmacist if you have questions. °COMMON BRAND NAME(S): OCREVUS °What should I tell my health care provider before I take this medicine? °They need to know if you have any of these conditions: °· cancer °· hepatitis B infection °· other infection (especially a virus infection such as chickenpox, cold sores, or herpes) °· an unusual or allergic reaction to ocrelizumab, other medicines, foods, dyes or preservatives °· pregnant or trying to get pregnant °· breast-feeding °How should I use this medicine? °This medicine is for infusion into a vein. It is given by a health care professional in a hospital or clinic setting. °A special MedGuide will be given to you before each treatment. Be sure to read this information carefully each time. °Talk to your pediatrician regarding the use of this medicine in children. Special care may be needed. °Overdosage: If you think you have taken too much of this medicine contact a poison control center or emergency room at once. °NOTE: This medicine is only for you. Do not share this medicine with others. °What if I miss a dose? °Keep appointments for follow-up doses as directed. It is important not to miss your dose. Call your doctor or health care professional if you are unable to keep an appointment. °What may interact with this medicine? °· alemtuzumab °· daclizumab °· dimethyl fumarate °· fingolimod °· glatiramer °· interferon beta °· live virus vaccines °· mitoxantrone °· natalizumab °· peginterferon beta °· rituximab °· steroid medicines like prednisone or cortisone °· teriflunomide °This list may not describe all possible interactions. Give your health care provider a list of all the medicines, herbs,  non-prescription drugs, or dietary supplements you use. Also tell them if you smoke, drink alcohol, or use illegal drugs. Some items may interact with your medicine. °What should I watch for while using this medicine? °Tell your doctor or healthcare professional if your symptoms do not start to get better or if they get worse. °This medicine can cause serious allergic reactions. To reduce your risk you may need to take medicine before treatment with this medicine. Take your medicine as directed. °Women should inform their doctor if they wish to become pregnant or think they might be pregnant. There is a potential for serious side effects to an unborn child. Talk to your health care professional or pharmacist for more information. Female patients should use effective birth control methods while receiving this medicine and for 6 months after the last dose. °Call your doctor or health care professional for advice if you get a fever, chills or sore throat, or other symptoms of a cold or flu. Do not treat yourself. This drug decreases your body's ability to fight infections. Try to avoid being around people who are sick. °If you have a hepatitis B infection or a history of a hepatitis B infection, talk to your doctor. The symptoms of hepatitis B may get worse if you take this medicine. °In some patients, this medicine may cause a serious brain infection that may cause death. If you have any problems seeing, thinking, speaking, walking, or standing, tell your doctor right away. If you cannot reach your doctor, urgently seek other source of medical care. °This medicine can decrease the response to a vaccine. If you need to get   vaccinated, tell your healthcare professional if you have received this medicine. Extra booster doses may be needed. Talk to your doctor to see if a different vaccination schedule is needed. °Talk to your doctor about your risk of cancer. You may be more at risk for certain types of cancers if you  take this medicine. °What side effects may I notice from receiving this medicine? °Side effects that you should report to your doctor or health care professional as soon as possible: °· allergic reactions like skin rash, itching or hives, swelling of the face, lips, or tongue °· breathing problems °· facial flushing °· fast, irregular heartbeat °· lump or soreness in the breast °· signs and symptoms of herpes such as cold sore, shingles, or genital sores °· signs and symptoms of infection like fever or chills, cough, sore throat, pain or trouble passing urine °· signs and symptoms of low blood pressure like dizziness; feeling faint or lightheaded, falls; unusually weak or tired °· signs and symptoms of progressive multifocal leukoencephalopathy (PML) like changes in vision; clumsiness; confusion; personality changes; weakness on one side of the body °· swelling of the ankles, feet, hands °Side effects that usually do not require medical attention (report these to your doctor or health care professional if they continue or are bothersome): °· back pain °· depressed mood °· diarrhea °· pain, redness, or irritation at site where injected °This list may not describe all possible side effects. Call your doctor for medical advice about side effects. You may report side effects to FDA at 1-800-FDA-1088. °Where should I keep my medicine? °This drug is given in a hospital or clinic and will not be stored at home. °NOTE: This sheet is a summary. It may not cover all possible information. If you have questions about this medicine, talk to your doctor, pharmacist, or health care provider. °© 2020 [employer] (2018-05-25 07:41:53) ° °

## 2020-04-05 NOTE — Progress Notes (Signed)
I have reviewed and agreed above plan. 

## 2020-05-04 ENCOUNTER — Ambulatory Visit: Payer: Medicaid Other | Admitting: Neurology

## 2020-05-04 ENCOUNTER — Encounter: Payer: Self-pay | Admitting: Neurology

## 2020-05-04 ENCOUNTER — Telehealth: Payer: Self-pay

## 2020-05-04 VITALS — BP 121/82 | HR 82 | Ht 64.0 in | Wt 220.5 lb

## 2020-05-04 DIAGNOSIS — R5383 Other fatigue: Secondary | ICD-10-CM | POA: Insufficient documentation

## 2020-05-04 DIAGNOSIS — R159 Full incontinence of feces: Secondary | ICD-10-CM

## 2020-05-04 DIAGNOSIS — G35 Multiple sclerosis: Secondary | ICD-10-CM

## 2020-05-04 DIAGNOSIS — M25511 Pain in right shoulder: Secondary | ICD-10-CM

## 2020-05-04 DIAGNOSIS — G8929 Other chronic pain: Secondary | ICD-10-CM

## 2020-05-04 MED ORDER — PROVIGIL 200 MG PO TABS: 200.0000 mg | ORAL_TABLET | Freq: Every day | ORAL | 4 refills | Status: DC

## 2020-05-04 NOTE — Telephone Encounter (Signed)
PA request for Provigil submitted to [facility], Key: [id number] - PA Case ID: [id number]. Awaiting determination from [employer].

## 2020-05-04 NOTE — Progress Notes (Signed)
HISTORY OF PRESENT ILLNESS:  She has history of hypertension, in May 18, 2010,while walking towards her car at the end of her working day, her legs give out underneath her, she fell face down on the concrete pavement at her Parking lot infront of [employer], knocked out her front teeth.   MRI of the brain,in May 19, 2010 without contrast, demonstrated diffuse white matter changes throughout the supratentorial and infratentorial region, with corpus callosum involvement, MRI scan of the cervical spine shows demyelinating enhancing plaques at C 3 on right and C 4-5 on left. consistent with MS   Repeat MRI scan of the brain in 2012, showed multiple brainstem, cerebellar, subcortical, corpus callosal and periventricular white matter lesions typical for demyelinating disease. Several T 1 black holes and enhancing lesions are seen bilaterally suggesting chronic and active disease. Significant progression and worsening compared with noncontrast MRI 05/19/10.  CSF showed elevated Ig G index, 9 ocb,extensive lab test for mimic were negative, including HIV, RPR, b12 b6, ACE lyme SSA, B, CBC, CMP, PEP, ana, copper, mild ele crp 6.5, and very low vit D<4.   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 In 05/17/13, index value was 1.17. Positive with titer of 1.6 9 in June 07 2014  Over the years, repeat MRI of the brain and cervical spine showed significant lesion load, but there was no significant contrast-enhancement  Since 2017, she began to notice gradual worsening gait abnormality, more weakness at right upper and lower extremity,  Had a trial of baclofen for lower  extremity spasticity with increased fatigue, no significant help in a progressive gait abnormality.   She began to receive ocrelizumab infusion since February 2020 because of worsening gait abnormality, spasticity, in addition, she also complains of increased fatigue, bowel and bladder incontinence on a daily basis, developed right foot drop, has been relying on her walker since 2019, frequent falling, also complains of worsening depression,  I have personally reviewed most recent MRI of the brain, cervical spine in August 2020,   multiple T2 hyperintensity foci within the spinal cord, adjacent to C1, C2, C3, C4-5, C5-6             MRI of brain showed multiple scattered periventricular, chest cortical, pericallosal, pontine, cerebral peduncle, right cerebellar white matter hyperintensity lesions, no contrast-enhancement, no significant change compared to previous scan in 2019,  Laboratory evaluation showed normal IgG 1380, IgA mildly elevated 425, normal IgM 29,CMP, creatinine was mildly elevated 1.03, nondetectable CD19 and 20 normal TSH, CBC,   UPDATE May 05 2020: She came in with her mother, she continue with ocrelizumab, continue have worsening gait abnormality, worsening urinary and bowel incontinence  Last MRI of the brain was in August 2020, I personally reviewed films, multiple cervical spinal cord involvement, scattered at C1-C5 6 level, MRI of the brain showed multiple MS lesions and periventricular, juxtacortical, pericallosal, pontine, cerebral peduncle, right cerebellar white matter, multiple T1 black holes, there was no contrast-enhancement   REVIEW OF SYSTEMS: Out of a complete 14 system review of symptoms, the patient complains only of the following symptoms, and all other reviewed systems are negative. As above ALLERGIES: No  Known Allergies  HOME MEDICATIONS: Outpatient Medications Prior to Visit  Medication Sig Dispense Refill  . acetaminophen (TYLENOL) 500 MG tablet Take 1  tablet (500 mg total) by mouth as needed. As needed for infusion reaction 30 tablet 0  . atenolol (TENORMIN) 50 MG tablet Take 1 tablet (50 mg total) by mouth daily. 90 tablet 4  . calcium citrate (CALCITRATE - DOSED IN MG ELEMENTAL CALCIUM) 950 MG tablet Take 1 tablet by mouth daily.      . Cholecalciferol (VITAMIN D3) 1000 units CAPS Take by mouth daily.    . diphenhydrAMINE (BENADRYL ALLERGY) 25 mg capsule Take 1 capsule (25 mg total) by mouth as needed for itching or allergies. 2 capsule 0  . diphenhydrAMINE (BENADRYL) 50 MG tablet Take 1 tablet (50 mg total) by mouth as needed. 30 minutes before ocrelizumab infusion 30 tablet 0  . ibuprofen (ADVIL,MOTRIN) 800 MG tablet Take 800 mg by mouth as needed.    Marland Kitchen lisinopril (PRINIVIL,ZESTRIL) 40 MG tablet TAKE 1/2 TABLET BY MOUTH EVERY DAY 30 tablet 5  . methocarbamol (ROBAXIN) 500 MG tablet Take 500 mg by mouth as needed.    . methylPREDNISolone sodium succinate (SOLU-MEDROL) 125 mg/2 mL injection Inject 2 mLs (125 mg total) into the vein as needed. 30 minutes before ocrelizumab infusion 1 each 0  . ocrelizumab 600 mg in sodium chloride 0.9 % 500 mL Inject 600 mg into the vein every 6 (six) months.    Marland Kitchen ocrelizumab 600 mg in sodium chloride 0.9 % 500 mL Inject 600 mg into the vein every 6 (six) months. 1 each 5  . OCREVUS 300 MG/10ML injection INFUSE 600MG  EVERY SIX MONTHS. 20 mL 0  . PROVIGIL 200 MG tablet Take 1 tablet (200 mg total) by mouth daily. Provigil preferred on [place] Medicaid drug list.  Approved.  .  Valid through 12/31/2019 30 tablet 5  . sertraline (ZOLOFT) 50 MG tablet Take 2 tablets (100 mg total) by mouth daily. 60 tablet 11   No facility-administered medications prior to visit.    PAST MEDICAL HISTORY: Past Medical History:  Diagnosis Date  . Abnormality of gait   . Hypertension   . MS (multiple sclerosis) (HCC)   . Other nonspecific abnormal result of function study of brain and central nervous system   .  Personal history of fall     PAST SURGICAL HISTORY: Past Surgical History:  Procedure Laterality Date  . CHOLECYSTECTOMY    . GALLBLADDER SURGERY      FAMILY HISTORY: Family History  Problem Relation Age of Onset  . Cancer Maternal Grandmother   . High blood pressure Other        Runs in both sides of the family    SOCIAL HISTORY: Social History   Socioeconomic History  . Marital status: Single    Spouse name: Not on file  . Number of children: 2  . Years of education: 4  . Highest education level: Not on file  Occupational History  . 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 Smoking status: Never Smoker  . Smokeless tobacco: Never Used  Vaping Use  . Vaping Use: Never used  Substance and Sexual Activity  . Alcohol use: No  . Drug use: No  . Sexual activity: Not on file  Other Topics Concern  . Not on file  Social History Narrative   Patient has a high school education and two children. Patient is not currently working.  Patient is right-handed.   Patient drinks three cups of tea daily and two cans of soda daily.   Social Determinants of Health   Financial Resource Strain:   . Difficulty of Paying Living Expenses: Not on file  Food Insecurity:   . Worried About Programme researcher, broadcasting/film/video in the Last Year: Not on file  . Ran Out of Food in the Last Year: Not on file  Transportation Needs:   . Lack of Transportation (Medical): Not on file  . Lack of Transportation (Non-Medical): Not on file  Physical Activity:   . Days of Exercise per Week: Not on file  . Minutes of Exercise per Session: Not on file  Stress:   . Feeling of Stress : Not on file  Social Connections:   . Frequency of Communication with Friends and Family: Not on file  . Frequency of Social Gatherings with Friends and Family: Not on file  . Attends Religious Services: Not on file  . Active Member of Clubs or Organizations: Not on file  . Attends Banker  Meetings: Not on file  . Marital Status: Not on file  Intimate Partner Violence:   . Fear of Current or Ex-Partner: Not on file  . Emotionally Abused: Not on file  . Physically Abused: Not on file  . Sexually Abused: Not on file   PHYSICAL EXAM  Vitals:   05/04/20 1046  BP: 121/82  Pulse: 82  Weight: 220 lb 8 oz (100 kg)  Height: 5\' 4"  (1.626 m)   Body mass index is 37.85 kg/m.  Generalized: Well developed, in no acute distress   Neurological examination  Mentation: Alert oriented to time, place, history taking. Follows all commands speech and language fluent Cranial nerve II-XII: Pupils were equal round reactive to light. Extraocular movements were full, visual field were full on confrontational test. Facial sensation and strength were normal.  Head turning and shoulder shrug  were normal and symmetric. Motor: Spastic right hemiparesis, right upper extremity strength is 4, mild right hip flexion, moderate right ankle dorsiflexion weakness, Sensory: Sensory testing is intact to soft touch on all 4 extremities. No evidence of extinction is noted.  Coordination: Mild difficulty with coordination in the right and lower extremity, extremities are heavy feeling to her Gait and station: Has to push up to get up from seated position, unsteady, dragging right leg Reflexes: Deep tendon reflexes are symmetric but brisk on the right  DIAGNOSTIC DATA (LABS, IMAGING, TESTING) - I reviewed patient records, labs, notes, testing and imaging myself where available.  Lab Results  Component Value Date   WBC 10.2 11/02/2019   HGB 10.4 (L) 11/02/2019   HCT 32.6 (L) 11/02/2019   MCV 86 11/02/2019   PLT 466 (H) 11/02/2019      Component Value Date/Time   NA 138 11/02/2019 1129   K 4.7 11/02/2019 1129   CL 99 11/02/2019 1129   CO2 23 11/02/2019 1129   GLUCOSE 92 11/02/2019 1129   GLUCOSE 84 05/18/2010 1900   BUN 16 11/02/2019 1129   CREATININE 1.29 (H) 11/02/2019 1129   CALCIUM 9.5  11/02/2019 1129   PROT 7.9 11/02/2019 1129   ALBUMIN 4.6 11/02/2019 1129   AST 17 11/02/2019 1129   ALT 12 11/02/2019 1129   ALKPHOS 113 11/02/2019 1129   BILITOT 0.3 11/02/2019 1129   GFRNONAA 52 (L) 11/02/2019 1129   GFRAA 60 11/02/2019 1129   No results found for: CHOL, HDL, LDLCALC, LDLDIRECT, TRIG, CHOLHDL No results  found for: HGBA1C No results found for: VITAMINB12 Lab Results  Component Value Date   TSH 0.926 12/31/2018      ASSESSMENT AND PLAN 39 y.o. year old female    Relapsing remitting multiple sclerosis since 2011  Tysabri infusion from February 2012 to December 2018  Continue Ocrevus since February 2019  Repeat MRI of brain and cervical spine w/wo  Laboratory evaluations.   Progressive worsening gait abnormality  Encourage moderate exercise  Worsening urinary and bowel incontinence  Accidents on daily basis  Worsening fatigue  Restart Provigil 200 mg daily  Worsening depression  Continue Zoloft 100 mg daily  Levert Feinstein, M.D. Ph.D.  [facility] [address] [place], [place] [address] Phone: [phone] Fax:      [phone]

## 2020-05-08 ENCOUNTER — Telehealth: Payer: Self-pay | Admitting: Neurology

## 2020-05-08 NOTE — Telephone Encounter (Signed)
[employer] community auth: [id number] & [id number] (Exp. 05/08/20 to 06/22/20) order sent to GI. They will reach out to the patient to schedule.

## 2020-05-08 NOTE — Telephone Encounter (Signed)
Approved on December 2 -- Request Reference Number: [id number]. PROVIGIL TAB 200MG  is approved through 05/04/2021. For further questions, call 14/07/2020 at [phone].

## 2020-05-18 ENCOUNTER — Ambulatory Visit
Admission: RE | Admit: 2020-05-18 | Discharge: 2020-05-18 | Disposition: A | Discharge status: Home / Self Care | Payer: Medicaid Other | Source: Ambulatory Visit | Attending: Neurology | Admitting: Neurology

## 2020-05-18 ENCOUNTER — Other Ambulatory Visit: Payer: Self-pay

## 2020-05-18 DIAGNOSIS — G35 Multiple sclerosis: Secondary | ICD-10-CM

## 2020-05-18 DIAGNOSIS — R159 Full incontinence of feces: Secondary | ICD-10-CM

## 2020-05-18 DIAGNOSIS — G8929 Other chronic pain: Secondary | ICD-10-CM

## 2020-05-18 DIAGNOSIS — R5383 Other fatigue: Secondary | ICD-10-CM

## 2020-05-18 MED ORDER — GADOBENATE DIMEGLUMINE 529 MG/ML IV SOLN
20.0000 mL | Freq: Once | INTRAVENOUS | Status: AC | PRN
  Administered 2020-05-18: 20 mL via INTRAVENOUS

## 2020-05-21 ENCOUNTER — Other Ambulatory Visit: Payer: Medicaid Other

## 2020-05-22 ENCOUNTER — Telehealth: Payer: Self-pay | Admitting: Neurology

## 2020-05-22 NOTE — Telephone Encounter (Signed)
I spoke to the patient and she verbalized understanding of the MRI findings.

## 2020-05-22 NOTE — Telephone Encounter (Signed)
   IMPRESSION: This MRI of the brain with and without contrast shows the following: 1.   Multiple T2/FLAIR hyperintense foci in the brainstem, cerebellum, spinal cord, thalamus and hemispheres in a pattern and configuration consistent with chronic demyelinating plaque associated with multiple sclerosis.  None of the foci enhance or appear to be acute.  Compared to the MRI from 01/28/2019, there do not appear to be any new lesions.      2.   No acute findings. 3.   Normal enhancement pattern.  Please call patient, MRI of the brain and cervical spine showed chronic MS lesions, there was no new lesions compared to previous study in August 2020

## 2020-06-05 ENCOUNTER — Telehealth: Payer: Self-pay | Admitting: *Deleted

## 2020-06-05 ENCOUNTER — Encounter: Payer: Self-pay | Admitting: *Deleted

## 2020-06-05 NOTE — Telephone Encounter (Signed)
Our office received a request from [employer]. They need to update the "Patient Consent Form". Per the notification, they have attempted to reach the patient several times, unsuccessfully. If the patient is to continue Ocrevus, they have to have this form completed so she can stay enrolled.  I have called the patient and left her a message providing her with this information. She needs to call [employer] back at [phone].  He emergency contact number is incorrect. She does not have an active mychart account.    I have also mailed her a letter and attached a copy of the "Patient Consent Form". She may call the company to give consent over the phone.

## 2020-09-19 ENCOUNTER — Other Ambulatory Visit: Payer: Self-pay | Admitting: Neurology

## 2020-09-19 ENCOUNTER — Telehealth: Payer: Self-pay | Admitting: *Deleted

## 2020-09-19 DIAGNOSIS — G35 Multiple sclerosis: Secondary | ICD-10-CM

## 2020-09-19 NOTE — Progress Notes (Signed)
Order for Julie Pope was put in

## 2020-09-19 NOTE — Telephone Encounter (Signed)
The patient has her next Ocrevus infusion scheduled at [facility] on Thursday, 09/21/20. New orders need to be placed in Epic.

## 2020-09-21 ENCOUNTER — Non-Acute Institutional Stay (HOSPITAL_COMMUNITY)
Admission: RE | Admit: 2020-09-21 | Discharge: 2020-09-21 | Disposition: A | Discharge status: Home / Self Care | Payer: Medicaid Other | Source: Ambulatory Visit | Attending: Internal Medicine | Admitting: Internal Medicine

## 2020-09-21 ENCOUNTER — Other Ambulatory Visit: Payer: Self-pay

## 2020-09-21 DIAGNOSIS — G35 Multiple sclerosis: Secondary | ICD-10-CM | POA: Insufficient documentation

## 2020-09-21 MED ORDER — DIPHENHYDRAMINE HCL 25 MG PO CAPS: 50.0000 mg | ORAL_CAPSULE | ORAL | Status: DC | PRN

## 2020-09-21 MED ORDER — ACETAMINOPHEN 325 MG PO TABS
650.0000 mg | ORAL_TABLET | ORAL | Status: DC
  Administered 2020-09-21: 650 mg via ORAL
  Filled 2020-09-21: qty 2

## 2020-09-21 MED ORDER — SODIUM CHLORIDE 0.9 % IV SOLN
600.0000 mg | Freq: Once | INTRAVENOUS | Status: AC
  Administered 2020-09-21: 600 mg via INTRAVENOUS
  Filled 2020-09-21: qty 20

## 2020-09-21 MED ORDER — DIPHENHYDRAMINE HCL 25 MG PO CAPS
50.0000 mg | ORAL_CAPSULE | Freq: Once | ORAL | Status: AC
  Administered 2020-09-21: 50 mg via ORAL
  Filled 2020-09-21: qty 2

## 2020-09-21 MED ORDER — METHYLPREDNISOLONE SODIUM SUCC 125 MG IJ SOLR
125.0000 mg | Freq: Once | INTRAMUSCULAR | Status: AC
  Administered 2020-09-21: 125 mg via INTRAVENOUS
  Filled 2020-09-21: qty 2

## 2020-09-21 MED ORDER — SODIUM CHLORIDE 0.9 % IV SOLN
INTRAVENOUS | Status: DC | PRN
  Administered 2020-09-21: 250 mL via INTRAVENOUS

## 2020-09-21 MED ORDER — METHYLPREDNISOLONE SODIUM SUCC 125 MG IJ SOLR: 125.0000 mg | INTRAMUSCULAR | Status: DC

## 2020-09-21 NOTE — Progress Notes (Signed)
PATIENT CARE CENTER NOTE  Diagnosis: Relapsing remitting multiple sclerosis (HCC) (G35)   Provider: Levert Feinstein, MD   Procedure: Ocrevus infusion    Note: Patient received Ocrevus infusion via PIV. Pre-medications given (Tylenol, Benadryl, Solumedrol) per order. Patient declined 1 hour observation post-infusion. Vital signs stable. Discharge instructions given. Patient alert, oriented and ambulatory at discharge.

## 2020-09-21 NOTE — Discharge Instructions (Signed)
Ocrelizumab injection What is this medicine? OCRELIZUMAB (ok re LIZ ue mab) treats multiple sclerosis. It helps to decrease the number of multiple sclerosis relapses. It is not a cure. This medicine may be used for other purposes; ask your health care provider or pharmacist if you have questions. COMMON BRAND NAME(S): OCREVUS What should I tell my health care provider before I take this medicine? They need to know if you have any of these conditions:  cancer  hepatitis B infection  other infection (especially a virus infection such as chickenpox, cold sores, or herpes)  an unusual or allergic reaction to ocrelizumab, other medicines, foods, dyes or preservatives  pregnant or trying to get pregnant  breast-feeding How should I use this medicine? This medicine is for infusion into a vein. It is given by a health care professional in a hospital or clinic setting. A special MedGuide will be given to you before each treatment. Be sure to read this information carefully each time. Talk to your pediatrician regarding the use of this medicine in children. Special care may be needed. Overdosage: If you think you have taken too much of this medicine contact a poison control center or emergency room at once. NOTE: This medicine is only for you. Do not share this medicine with others. What if I miss a dose? Keep appointments for follow-up doses as directed. It is important not to miss your dose. Call your doctor or health care professional if you are unable to keep an appointment. What may interact with this medicine?  alemtuzumab  daclizumab  dimethyl fumarate  fingolimod  glatiramer  interferon beta  live virus vaccines  mitoxantrone  natalizumab  peginterferon beta  rituximab  steroid medicines like prednisone or cortisone  teriflunomide This list may not describe all possible interactions. Give your health care provider a list of all the medicines, herbs,  non-prescription drugs, or dietary supplements you use. Also tell them if you smoke, drink alcohol, or use illegal drugs. Some items may interact with your medicine. What should I watch for while using this medicine? Your condition will be monitored carefully while you are receiving this drug. This drug can cause serious allergic reactions. To reduce your risk, you may need to take drug before treatment with this drug. Take your drug as directed. Do not become pregnant while taking this drug or for 6 months after stopping it. Women should inform their health care provider if they wish to become pregnant or think they might be pregnant. There is potential for serious harm to an unborn child. Talk to your health care provider for more information. This drug may increase your risk of getting an infection. Call your health care provider for advice if you get a fever, chills, or sore throat, or other symptoms of a cold or flu. Do not treat yourself. Try to avoid being around people who are sick. If you have hepatitis B, talk to your health care provider if you plan to stop this drug. The symptoms of hepatitis B may get worse if you stop this drug. In some patients, this drug may cause a serious brain infection that may cause death. If you have any problems seeing, thinking, speaking, walking, or standing, tell your health care provider right away. If you cannot reach your health care provider, urgently seek other source of medical care. This drug can decrease the response to a vaccine. If you need to get vaccinated, tell your health care provider if you have received this drug. Extra   booster doses may be needed. Talk to your health care provider to see if a different vaccination schedule is needed. Talk to your health care provider about your risk of cancer. You may be more at risk for certain types of cancer if you take this drug. What side effects may I notice from receiving this medicine? Side effects that  you should report to your doctor or health care professional as soon as possible:  allergic reactions (skin rash, itching or hives; swelling of the face, lips, or tongue)  facial flushing  fast, irregular heartbeat  infection (fever, chills, cough, sore throat, pain or trouble passing urine)  liver injury (dark yellow or brown urine; general ill feeling or flu-like symptoms; loss of appetite, right upper belly pain; unusually weak or tired, yellowing of the eyes or skin)  low blood pressure (dizziness; feeling faint or lightheaded, falls; unusually weak or tired)  swelling of the ankles, feet, hands  trouble breathing Side effects that usually do not require medical attention (report these to your doctor or health care professional if they continue or are bothersome):  back pain  depressed mood  diarrhea  pain, redness, or irritation at site where injected This list may not describe all possible side effects. Call your doctor for medical advice about side effects. You may report side effects to FDA at 1-800-FDA-1088. Where should I keep my medicine? This drug is given in a hospital or clinic and will not be stored at home. NOTE: This sheet is a summary. It may not cover all possible information. If you have questions about this medicine, talk to your doctor, pharmacist, or health care provider.  2021 Elsevier/Gold Standard (2019-04-20 13:09:45)  

## 2020-09-25 DIAGNOSIS — Z0271 Encounter for disability determination: Secondary | ICD-10-CM

## 2020-11-02 ENCOUNTER — Ambulatory Visit: Payer: Medicaid Other | Admitting: Neurology

## 2020-12-25 ENCOUNTER — Encounter: Payer: Self-pay | Admitting: Neurology

## 2020-12-25 ENCOUNTER — Ambulatory Visit: Payer: Medicaid Other | Admitting: Neurology

## 2020-12-25 VITALS — BP 120/83 | HR 71 | Ht 64.0 in | Wt 215.0 lb

## 2020-12-25 DIAGNOSIS — R269 Unspecified abnormalities of gait and mobility: Secondary | ICD-10-CM | POA: Diagnosis not present

## 2020-12-25 DIAGNOSIS — G35 Multiple sclerosis: Secondary | ICD-10-CM

## 2020-12-25 DIAGNOSIS — R159 Full incontinence of feces: Secondary | ICD-10-CM

## 2020-12-25 DIAGNOSIS — R5383 Other fatigue: Secondary | ICD-10-CM | POA: Diagnosis not present

## 2020-12-25 MED ORDER — SERTRALINE HCL 100 MG PO TABS: 100.0000 mg | ORAL_TABLET | Freq: Every day | ORAL | 4 refills | Status: DC

## 2020-12-25 NOTE — Progress Notes (Signed)
ASSESSMENT AND PLAN 40 y.o. year old female    Relapsing remitting multiple sclerosis since 2011  Tysabri infusion from February 2012 to December 2018  Start Ocrevus since February 2019, tolerating it well, no clinical flareup since    Progressive worsening gait abnormality  Encourage moderate exercise  Worsening urinary and bowel incontinence  Accidents on daily basis  Fatigue  Continue Provigil 200 mg daily  Worsening depression  Continue Zoloft 100 mg daily   DIAGNOSTIC DATA (LABS, IMAGING, TESTING) - I reviewed patient records, labs, notes, testing and imaging myself where available. MRI of brain w/wo Dec 2021  Multiple T2/FLAIR hyperintense foci in the brainstem, cerebellum, spinal cord, thalamus and hemispheres in a pattern and configuration consistent with chronic demyelinating plaque associated with multiple sclerosis.  None of the foci enhance or appear to be acute.  Compared to the MRI from 01/28/2019, there do not appear to be any new lesions.      2.   No acute findings. 3.   Normal enhancement pattern.  MRI cervical Spine w/wo Dec 2021: 1.   Multiple T2 hyperintense foci within the spinal cord as detailed above in a pattern consistent with chronic demyelinating plaque associated with multiple sclerosis.  None of the foci appear to be acute.  They do not enhance.  Compared to the MRI from 01/28/2019, there are no new lesions. 2.   Normal enhancement pattern 3.   Minimal stable degenerative changes at C4-C5  Laboratory evaluations in April 2022, normal iron saturation, ferritin 634, TIBC, B12 660, CBC showed mild anemia hemoglobin of 11.3,  HISTORY OF PRESENT ILLNESS:  She has history of hypertension, in May 18, 2010,while walking towards her car at the end of her working day, her legs give out underneath her, she fell face down on the concrete pavement at her Parking lot infront of [employer], knocked out her front teeth.    MRI of the brain,in  May 19, 2010 without contrast, demonstrated diffuse white matter changes throughout the supratentorial and infratentorial region, with corpus callosum involvement, MRI scan of the cervical spine shows demyelinating enhancing plaques at C 3 on right and C 4-5 on left. consistent with MS    Repeat MRI scan of the brain in 2012, showed multiple brainstem, cerebellar, subcortical, corpus callosal and periventricular white matter lesions typical for demyelinating disease. Several T 1 black holes and enhancing lesions are seen bilaterally suggesting chronic and active disease. Significant progression and worsening compared with noncontrast MRI 05/19/10.  CSF showed elevated Ig G index, 9 ocb, extensive lab test for mimic were negative, including HIV, RPR, b12 b6, ACE lyme SSA, B, CBC, CMP, PEP, ana, copper, mild ele crp 6.5, and very low vit D<4.    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 In 05/17/13,  index value was 1.17. Positive with titer of 1.6 9 in June 07 2014   Over the years, repeat MRI of the brain and cervical spine showed significant lesion load, but there was no significant contrast-enhancement   Since 2017, she began to notice gradual worsening gait abnormality, more weakness at right upper and lower extremity,  Had a trial of baclofen for lower extremity spasticity with increased fatigue, no significant help in a progressive gait abnormality.    She began to receive ocrelizumab infusion since February 2020 because of worsening gait abnormality, spasticity, in addition, she also complains of increased fatigue, bowel and bladder incontinence on a daily basis,  developed right foot drop, has been relying on her walker since 2019, frequent falling, also complains of worsening depression,   I have personally reviewed most recent MRI of the brain, cervical spine in August 2020,   multiple T2 hyperintensity foci within the spinal cord, adjacent to C1, C2, C3, C4-5, C5-6             MRI of brain showed multiple scattered periventricular, chest cortical, pericallosal, pontine, cerebral peduncle, right cerebellar white matter hyperintensity lesions, no contrast-enhancement, no significant change compared to previous scan in 2019,   Laboratory evaluation showed normal IgG 1380, IgA mildly elevated 425, normal IgM 29,CMP, creatinine was mildly elevated 1.03, nondetectable CD19 and 20 normal TSH, CBC,   UPDATE May 05 2020: She came in with her mother, she continue with ocrelizumab, continue have worsening gait abnormality, worsening urinary and bowel incontinence  Last MRI of the brain was in August 2020, I personally reviewed films, multiple cervical spinal cord involvement, scattered at C1-C5 6 level, MRI of the brain showed multiple MS lesions and periventricular, juxtacortical, pericallosal, pontine, cerebral peduncle, right cerebellar white matter, multiple T1 black holes, there was no contrast-enhancement  Update December 27, 2020: She got disability finally, overall doing well, tolerating ocrelizumab infusion, continue have gait abnormality, right side has more difficulty, rely on walker for longer distance, also has fatigue, urinary urgency, incontinence  PHYSICAL EXAM  Vitals:   12/25/20 1153  BP: 120/83  Pulse: 71  Weight: 215 lb (97.5 kg)  Height: 5\' 4"  (1.626 m)   Body mass index is 36.9 kg/m.  PHYSICAL EXAMNIATION:  Gen: NAD, conversant, well nourised, well groomed                     Cardiovascular: Regular rate rhythm, no peripheral edema, warm, nontender. Eyes: Conjunctivae clear without exudates or hemorrhage Neck: Supple, no carotid  bruits. Pulmonary: Clear to auscultation bilaterally   NEUROLOGICAL EXAM:  MENTAL STATUS: Speech/Cognition: Awake, alert, normal speech, oriented to history taking and casual conversation.  CRANIAL NERVES: CN II: Visual fields are full to confrontation.  Pupils are round equal and briskly reactive to light. CN III, IV, VI: extraocular movement are normal. No ptosis. CN V: Facial sensation is intact to light touch. CN VII: Face is symmetric with normal eye closure and smile. CN VIII: Hearing is normal to casual conversation CN IX, X: Palate elevates symmetrically. Phonation is normal. CN XI: Head turning and shoulder shrug are intact CN XII: Tongue is midline with normal movements and no atrophy.  MOTOR: Mild right upper extremity pronation, mild fixation of right arm upon rapid rotating movement, mild drift of right lower extremity,  REFLEXES: Reflexes are 1  and symmetric at the biceps, triceps, knees and ankles.    SENSORY: Intact to light touch, pinprick, positional and vibratory sensation at fingers and toes.  COORDINATION: There is no trunk or limb ataxia.    GAIT/STANCE: She needs push-up to get up from seated position, wide-based, unsteady dragging right leg  REVIEW OF SYSTEMS: Out of a complete 14 system review of symptoms, the patient complains only of the following symptoms, and all other reviewed systems are negative. As  above  ALLERGIES: No Known Allergies  HOME MEDICATIONS: Outpatient Medications Prior to Visit  Medication Sig Dispense Refill   acetaminophen (TYLENOL) 500 MG tablet Take 1 tablet (500 mg total) by mouth as needed. As needed for infusion reaction 30 tablet 0   atenolol (TENORMIN) 50 MG tablet Take 1 tablet (50 mg total) by mouth daily. 90 tablet 4   calcium citrate (CALCITRATE - DOSED IN MG ELEMENTAL CALCIUM) 950 MG tablet Take 1 tablet by mouth daily.       Cholecalciferol (VITAMIN D3) 1000 units CAPS Take by mouth daily.     diphenhydrAMINE  (BENADRYL ALLERGY) 25 mg capsule Take 1 capsule (25 mg total) by mouth as needed for itching or allergies. 2 capsule 0   diphenhydrAMINE (BENADRYL) 50 MG tablet Take 1 tablet (50 mg total) by mouth as needed. 30 minutes before ocrelizumab infusion 30 tablet 0   ibuprofen (ADVIL,MOTRIN) 800 MG tablet Take 800 mg by mouth as needed.     lisinopril (PRINIVIL,ZESTRIL) 40 MG tablet TAKE 1/2 TABLET BY MOUTH EVERY DAY 30 tablet 5   methocarbamol (ROBAXIN) 500 MG tablet Take 500 mg by mouth as needed.     methylPREDNISolone sodium succinate (SOLU-MEDROL) 125 mg/2 mL injection Inject 2 mLs (125 mg total) into the vein as needed. 30 minutes before ocrelizumab infusion 1 each 0   ocrelizumab 600 mg in sodium chloride 0.9 % 500 mL Inject 600 mg into the vein every 6 (six) months.     ocrelizumab 600 mg in sodium chloride 0.9 % 500 mL Inject 600 mg into the vein every 6 (six) months. 1 each 5   OCREVUS 300 MG/10ML injection INFUSE 600MG  EVERY SIX MONTHS. 20 mL 0   PROVIGIL 200 MG tablet Take 1 tablet (200 mg total) by mouth daily. 90 tablet 4   sertraline (ZOLOFT) 50 MG tablet Take 2 tablets (100 mg total) by mouth daily. 60 tablet 11   No facility-administered medications prior to visit.    PAST MEDICAL HISTORY: Past Medical History:  Diagnosis Date   Abnormality of gait    Hypertension    MS (multiple sclerosis) (HCC)    Other nonspecific abnormal result of function study of brain and central nervous system    Personal history of fall     PAST SURGICAL HISTORY: Past Surgical History:  Procedure Laterality Date   CHOLECYSTECTOMY     GALLBLADDER SURGERY      FAMILY HISTORY: Family History  Problem Relation Age of Onset   Cancer Maternal Grandmother    High blood pressure Other        Runs in both sides of the family    SOCIAL HISTORY: Social History   Socioeconomic History   Marital status: Single    Spouse name: Not on file   Number of children: 2   Years of education: 12    Highest education level: Not on file  Occupational History   Occupation: FILE Marine scientist: [employer]  Tobacco Use   Smoking status: Never   Smokeless tobacco: Never  Vaping Use   Vaping Use: Never used  Substance and Sexual Activity   Alcohol use: No   Drug use: No   Sexual activity: Not on file  Other Topics Concern   Not on file  Social History Narrative   Patient has a high school education and two children. Patient is not currently working.   Patient is right-handed.   Patient drinks three cups of tea daily  and two cans of soda daily.   Social Determinants of Health   Financial Resource Strain: Not on file  Food Insecurity: Not on file  Transportation Needs: Not on file  Physical Activity: Not on file  Stress: Not on file  Social Connections: Not on file  Intimate Partner Violence: Not on file    Levert Feinstein, M.D. Ph.D.  [facility] [address] [place], [place] [address] Phone: [phone] Fax:      [phone]

## 2020-12-27 ENCOUNTER — Telehealth: Payer: Self-pay | Admitting: Neurology

## 2020-12-27 LAB — COMPREHENSIVE METABOLIC PANEL
ALT: 10 IU/L (ref 0–32)
AST: 13 IU/L (ref 0–40)
Albumin/Globulin Ratio: 1.5 (ref 1.2–2.2)
Albumin: 4.8 g/dL (ref 3.8–4.8)
Alkaline Phosphatase: 106 IU/L (ref 44–121)
BUN/Creatinine Ratio: 13 (ref 9–23)
BUN: 15 mg/dL (ref 6–24)
Bilirubin Total: 0.3 mg/dL (ref 0.0–1.2)
CO2: 24 mmol/L (ref 20–29)
Calcium: 10.4 mg/dL — ABNORMAL HIGH (ref 8.7–10.2)
Chloride: 100 mmol/L (ref 96–106)
Creatinine, Ser: 1.18 mg/dL — ABNORMAL HIGH (ref 0.57–1.00)
Globulin, Total: 3.1 g/dL (ref 1.5–4.5)
Glucose: 93 mg/dL (ref 65–99)
Potassium: 5 mmol/L (ref 3.5–5.2)
Sodium: 139 mmol/L (ref 134–144)
Total Protein: 7.9 g/dL (ref 6.0–8.5)
eGFR: 60 mL/min/{1.73_m2} (ref >59)

## 2020-12-27 LAB — CBC WITH DIFFERENTIAL
Basophils Absolute: 0 10*3/uL (ref 0.0–0.2)
Basos: 0 %
EOS (ABSOLUTE): 0.1 10*3/uL (ref 0.0–0.4)
Eos: 1 %
Hematocrit: 36.2 % (ref 34.0–46.6)
Hemoglobin: 11.9 g/dL (ref 11.1–15.9)
Immature Grans (Abs): 0 10*3/uL (ref 0.0–0.1)
Immature Granulocytes: 0 %
Lymphocytes Absolute: 1.4 10*3/uL (ref 0.7–3.1)
Lymphs: 14 %
MCH: 29.2 pg (ref 26.6–33.0)
MCHC: 32.9 g/dL (ref 31.5–35.7)
MCV: 89 fL (ref 79–97)
Monocytes Absolute: 0.7 10*3/uL (ref 0.1–0.9)
Monocytes: 7 %
Neutrophils Absolute: 7.9 10*3/uL — ABNORMAL HIGH (ref 1.4–7.0)
Neutrophils: 78 %
RBC: 4.07 x10E6/uL (ref 3.77–5.28)
RDW: 12.6 % (ref 11.7–15.4)
WBC: 10.1 10*3/uL (ref 3.4–10.8)

## 2020-12-27 LAB — TSH: TSH: 0.838 u[IU]/mL (ref 0.450–4.500)

## 2020-12-27 LAB — IGG, IGA, IGM
IgA/Immunoglobulin A, Serum: 373 mg/dL — ABNORMAL HIGH (ref 87–352)
IgG (Immunoglobin G), Serum: 1208 mg/dL (ref 586–1602)
IgM (Immunoglobulin M), Srm: 23 mg/dL — ABNORMAL LOW (ref 26–217)

## 2020-12-27 LAB — CD19 AND CD20, FLOW CYTOMETRY

## 2020-12-27 NOTE — Telephone Encounter (Signed)
Please call patient, laboratory evaluation showed less than 0.5% of B-cell, no CD 19 and 20 population is identified, slight elevation in IgA, mildly decreased IgM, which are all expected change for patient receiving ocrelizumab  Normal CBC, slight elevation of creatinine 1.18, she would benefit increase water intake,

## 2020-12-28 NOTE — Telephone Encounter (Signed)
I spoke to the patient. She verbalized understanding of her lab results. Agreeable to increase her water intake.

## 2021-02-20 ENCOUNTER — Other Ambulatory Visit: Payer: Self-pay | Admitting: Neurology

## 2021-02-20 DIAGNOSIS — G35 Multiple sclerosis: Secondary | ICD-10-CM

## 2021-02-20 NOTE — Progress Notes (Signed)
Hospital admission order for ocrelizumab every 6 months was placed  Premedicated with Solu-Medrol 125 mg IV, Tylenol 650 mg, Benadryl 50 mg, and Pepcid 20 mg

## 2021-02-21 ENCOUNTER — Encounter (HOSPITAL_COMMUNITY): Payer: Medicaid Other

## 2021-03-23 ENCOUNTER — Other Ambulatory Visit: Payer: Self-pay

## 2021-03-23 ENCOUNTER — Non-Acute Institutional Stay (HOSPITAL_COMMUNITY)
Admission: RE | Admit: 2021-03-23 | Discharge: 2021-03-23 | Disposition: A | Discharge status: Home / Self Care | Payer: Medicaid Other | Source: Ambulatory Visit | Attending: Internal Medicine | Admitting: Internal Medicine

## 2021-03-23 DIAGNOSIS — G35 Multiple sclerosis: Secondary | ICD-10-CM | POA: Diagnosis present

## 2021-03-23 MED ORDER — SODIUM CHLORIDE 0.9 % IV SOLN
600.0000 mg | INTRAVENOUS | Status: DC
  Administered 2021-03-23: 600 mg via INTRAVENOUS
  Filled 2021-03-23: qty 20

## 2021-03-23 MED ORDER — SODIUM CHLORIDE 0.9 % IV SOLN: INTRAVENOUS | Status: DC | PRN

## 2021-03-23 MED ORDER — DIPHENHYDRAMINE HCL 25 MG PO CAPS
50.0000 mg | ORAL_CAPSULE | ORAL | Status: DC
  Administered 2021-03-23: 50 mg via ORAL
  Filled 2021-03-23: qty 2

## 2021-03-23 MED ORDER — METHYLPREDNISOLONE SODIUM SUCC 125 MG IJ SOLR
125.0000 mg | INTRAMUSCULAR | Status: DC
  Administered 2021-03-23: 125 mg via INTRAVENOUS
  Filled 2021-03-23: qty 2

## 2021-03-23 MED ORDER — FAMOTIDINE IN NACL 20-0.9 MG/50ML-% IV SOLN
20.0000 mg | INTRAVENOUS | Status: DC
  Administered 2021-03-23: 20 mg via INTRAVENOUS
  Filled 2021-03-23: qty 50

## 2021-03-23 MED ORDER — ACETAMINOPHEN 325 MG PO TABS
650.0000 mg | ORAL_TABLET | ORAL | Status: DC
  Administered 2021-03-23: 650 mg via ORAL
  Filled 2021-03-23: qty 2

## 2021-03-23 NOTE — Progress Notes (Signed)
PATIENT CARE CENTER NOTE    Diagnosis: Relapsing remitting multiple sclerosis (HCC) (G35)     Provider: Levert Feinstein, MD     Procedure: Ocrevus infusion      Note: Patient received Ocrevus infusion via PIV. Pre-medications given (Tylenol, Benadryl, Solumedrol, Pepcid) per order. Infusion titrated per protocol. Patient declined 1 hour observation post infusion. Vital signs stable. AVS offered but patient refused. Patient alert, oriented and ambulatory at discharge.

## 2021-06-28 ENCOUNTER — Encounter: Payer: Self-pay | Admitting: Neurology

## 2021-06-28 ENCOUNTER — Ambulatory Visit (INDEPENDENT_AMBULATORY_CARE_PROVIDER_SITE_OTHER): Payer: Medicare Other | Admitting: Neurology

## 2021-06-28 ENCOUNTER — Other Ambulatory Visit: Payer: Self-pay | Admitting: Neurology

## 2021-06-28 ENCOUNTER — Other Ambulatory Visit: Payer: Self-pay

## 2021-06-28 VITALS — BP 105/65 | HR 90 | Ht 66.0 in | Wt 210.3 lb

## 2021-06-28 DIAGNOSIS — R5383 Other fatigue: Secondary | ICD-10-CM

## 2021-06-28 DIAGNOSIS — G35 Multiple sclerosis: Secondary | ICD-10-CM | POA: Diagnosis not present

## 2021-06-28 DIAGNOSIS — R269 Unspecified abnormalities of gait and mobility: Secondary | ICD-10-CM | POA: Diagnosis not present

## 2021-06-28 MED ORDER — PROVIGIL 200 MG PO TABS: 200.0000 mg | ORAL_TABLET | Freq: Every day | ORAL | 1 refills | Status: DC

## 2021-06-28 NOTE — Patient Instructions (Signed)
Check labs  Continue ocrevus Continue same medications  See you back in 6 months

## 2021-06-28 NOTE — Progress Notes (Signed)
PATIENT: Julie Pope DOB: 14-Apr-1981  REASON FOR VISIT: Follow up for MS HISTORY FROM: Patient PRIMARY NEUROLOGIST: Dr. Terrace Arabia   HISTORY  She has history of hypertension, in May 18, 2010,while walking towards her car at the end of her working day, her legs give out underneath her, she fell face down on the concrete pavement at her Parking lot infront of [employer], knocked out her front teeth.    MRI of the brain,in May 19, 2010 without contrast, demonstrated diffuse white matter changes throughout the supratentorial and infratentorial region, with corpus callosum involvement, MRI scan of the cervical spine shows demyelinating enhancing plaques at C 3 on right and C 4-5 on left. consistent with MS    Repeat MRI scan of the brain in 2012, showed multiple brainstem, cerebellar, subcortical, corpus callosal and periventricular white matter lesions typical for demyelinating disease. Several T 1 black holes and enhancing lesions are seen bilaterally suggesting chronic and active disease. Significant progression and worsening compared with noncontrast MRI 05/19/10.  CSF showed elevated Ig G index, 9 ocb, extensive lab test for mimic were negative, including HIV, RPR, b12 b6, ACE lyme SSA, B, CBC, CMP, PEP, ana, copper, mild ele crp 6.5, and very low vit D<4.    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 In 05/17/13,  index value was 1.17. Positive with titer of 1.6 9 in June 07 2014   Over the years, repeat MRI of the brain and cervical spine showed significant lesion load, but there was no significant contrast-enhancement   Since 2017, she began to notice  gradual worsening gait abnormality, more weakness at right upper and lower extremity,   Had a trial of baclofen for lower extremity spasticity with increased fatigue, no significant help in a progressive gait abnormality.    She began to receive ocrelizumab infusion since February 2020 because of worsening gait abnormality, spasticity, in addition, she also complains of increased fatigue, bowel and bladder incontinence on a daily basis, developed right foot drop, has been relying on her walker since 2019, frequent falling, also complains of worsening depression,   I have personally reviewed most recent MRI of the brain, cervical spine in August 2020,   multiple T2 hyperintensity foci within the spinal cord, adjacent to C1, C2, C3, C4-5, C5-6             MRI of brain showed multiple scattered periventricular, chest cortical, pericallosal, pontine, cerebral peduncle, right cerebellar white matter hyperintensity lesions, no contrast-enhancement, no significant change compared to previous scan in 2019,   Laboratory evaluation showed normal IgG 1380, IgA mildly elevated 425, normal IgM 29,CMP, creatinine was mildly elevated 1.03, nondetectable CD19 and 20 normal TSH, CBC,    UPDATE May 05 2020: She came in with her mother, she continue with ocrelizumab, continue have worsening gait abnormality, worsening urinary and bowel incontinence   Last MRI of the brain was in August 2020, I personally reviewed films, multiple cervical spinal cord involvement, scattered at C1-C5 6 level, MRI of the brain showed multiple MS lesions and periventricular, juxtacortical, pericallosal, pontine, cerebral peduncle, right cerebellar white matter, multiple T1 black holes,  there was no contrast-enhancement   Update December 27, 2020: She got disability finally, overall doing well, tolerating ocrelizumab infusion, continue have gait abnormality, right side has more difficulty, rely on walker for longer distance, also has fatigue,  urinary urgency, incontinence  Update June 28, 2021 SS: Here today with mom, next De Nurse is in April, goes to the hospital. Few weeks before tired, dragging right foot, AFO doesn't fit. Lives with mom, uses walker. Fall going to the car, right foot got hung. Still urinary and bowel urgency, but better. Had breast biopsy done, was benign.  Labs in July 2022 showed IgM slightly low 23,  REVIEW OF SYSTEMS: Out of a complete 14 system review of symptoms, the patient complains only of the following symptoms, and all other reviewed systems are negative.  See HPI  ALLERGIES: No Known Allergies  HOME MEDICATIONS: Outpatient Medications Prior to Visit  Medication Sig Dispense Refill   acetaminophen (TYLENOL) 500 MG tablet Take 1 tablet (500 mg total) by mouth as needed. As needed for infusion reaction 30 tablet 0   atenolol (TENORMIN) 50 MG tablet Take 1 tablet (50 mg total) by mouth daily. 90 tablet 4   calcium citrate (CALCITRATE - DOSED IN MG ELEMENTAL CALCIUM) 950 MG tablet Take 1 tablet by mouth daily.       Cholecalciferol (VITAMIN D3) 1000 units CAPS Take by mouth daily.     diphenhydrAMINE (BENADRYL ALLERGY) 25 mg capsule Take 1 capsule (25 mg total) by mouth as needed for itching or allergies. 2 capsule 0   diphenhydrAMINE (BENADRYL) 50 MG tablet Take 1 tablet (50 mg total) by mouth as needed. 30 minutes before ocrelizumab infusion 30 tablet 0   ibuprofen (ADVIL,MOTRIN) 800 MG tablet Take 800 mg by mouth as needed.     lisinopril (PRINIVIL,ZESTRIL) 40 MG tablet TAKE 1/2 TABLET BY MOUTH EVERY DAY 30 tablet 5   methocarbamol (ROBAXIN) 500 MG tablet Take 500 mg by mouth as needed.     methylPREDNISolone sodium succinate (SOLU-MEDROL) 125 mg/2 mL injection Inject 2 mLs (125 mg total) into the vein as needed. 30 minutes before ocrelizumab infusion 1 each 0   ocrelizumab 600 mg in sodium chloride 0.9 % 500 mL Inject 600 mg into the vein every 6 (six) months.     ocrelizumab 600 mg in sodium  chloride 0.9 % 500 mL Inject 600 mg into the vein every 6 (six) months. 1 each 5   OCREVUS 300 MG/10ML injection INFUSE 600MG  EVERY SIX MONTHS. 20 mL 0   PROVIGIL 200 MG tablet Take 1 tablet (200 mg total) by mouth daily. 90 tablet 4   sertraline (ZOLOFT) 100 MG tablet Take 1 tablet (100 mg total) by mouth daily. 90 tablet 4   No facility-administered medications prior to visit.    PAST MEDICAL HISTORY: Past Medical History:  Diagnosis Date   Abnormality of gait    Hypertension    MS (multiple sclerosis) ([place])    Other nonspecific abnormal result of function study of brain and central nervous system    Personal history of fall     PAST SURGICAL HISTORY: Past Surgical History:  Procedure Laterality Date   CHOLECYSTECTOMY     GALLBLADDER SURGERY      FAMILY HISTORY: Family History  Problem Relation Age of Onset   Cancer Maternal Grandmother    High blood pressure Other        Runs in both sides of the family    SOCIAL HISTORY: Social History  Socioeconomic History   Marital status: Single    Spouse name: Not on file   Number of children: 2   Years of education: 38   Highest education level: Not on file  Occupational History   Occupation: FILE Armed forces operational officer: [employer]  Tobacco Use   Smoking status: Never   Smokeless tobacco: Never  Vaping Use   Vaping Use: Never used  Substance and Sexual Activity   Alcohol use: No   Drug use: No   Sexual activity: Not on file  Other Topics Concern   Not on file  Social History Narrative   Patient has a high school education and two children. Patient is not currently working.   Patient is right-handed.   Patient drinks three cups of tea daily and two cans of soda daily.   Social Determinants of Health   Financial Resource Strain: Not on file  Food Insecurity: Not on file  Transportation Needs: Not on file  Physical Activity: Not on file  Stress: Not on file  Social Connections: Not on file  Intimate Partner  Violence: Not on file   PHYSICAL EXAM  Vitals:   06/28/21 1546  BP: 105/65  Pulse: 90  Weight: 210 lb 5 oz (95.4 kg)  Height: 5\' 6"  (1.676 m)   Body mass index is 33.95 kg/m.  Generalized: Well developed, in no acute distress   Neurological examination  Mentation: Alert oriented to time, place, history taking. Follows all commands speech and language fluent Cranial nerve II-XII: Pupils were equal round reactive to light. Extraocular movements were full, visual field were full on confrontational test. Facial sensation and strength were normal.  Head turning and shoulder shrug  were normal and symmetric. Motor: Mild right upper extremity weakness, 4/5 weakness right lower extremity, right foot drop Sensory: Sensory testing is intact to soft touch on all 4 extremities. No evidence of extinction is noted.  Coordination: Cerebellar testing reveals good finger-nose-finger and heel-to-shin bilaterally.  Gait and station: Has to push off from seated position to stand, gait is wide-based, unsteady, relies on walker, drags the right leg Reflexes: Deep tendon reflexes are symmetric but slightly increased   DIAGNOSTIC DATA (LABS, IMAGING, TESTING) - I reviewed patient records, labs, notes, testing and imaging myself where available.  Lab Results  Component Value Date   WBC 10.1 12/25/2020   HGB 11.9 12/25/2020   HCT 36.2 12/25/2020   MCV 89 12/25/2020   PLT 466 (H) 11/02/2019      Component Value Date/Time   NA 139 12/25/2020 1258   K 5.0 12/25/2020 1258   CL 100 12/25/2020 1258   CO2 24 12/25/2020 1258   GLUCOSE 93 12/25/2020 1258   GLUCOSE 84 05/18/2010 1900   BUN 15 12/25/2020 1258   CREATININE 1.18 (H) 12/25/2020 1258   CALCIUM 10.4 (H) 12/25/2020 1258   PROT 7.9 12/25/2020 1258   ALBUMIN 4.8 12/25/2020 1258   AST 13 12/25/2020 1258   ALT 10 12/25/2020 1258   ALKPHOS 106 12/25/2020 1258   BILITOT 0.3 12/25/2020 1258   GFRNONAA 52 (L) 11/02/2019 1129   GFRAA 60 11/02/2019  1129   No results found for: CHOL, HDL, LDLCALC, LDLDIRECT, TRIG, CHOLHDL No results found for: HGBA1C No results found for: VITAMINB12 Lab Results  Component Value Date   TSH 0.838 12/25/2020      ASSESSMENT AND PLAN 41 y.o. year old female  has a past medical history of Abnormality of gait, Hypertension, MS (multiple sclerosis) (  [place]), Other nonspecific abnormal result of function study of brain and central nervous system, and Personal history of fall. here with:  1.  Relapsing remitting multiple sclerosis since 2011 -Continue Ocrevus, check labs today -Check MRI of the brain and cervical spine with and without contrast for routine MS surveillance -On Tysabri infusion from February 2012 to December 2018 -On Ocrevus since February 2019 -Last imaging MRI of the brain with and without contrast was stable compared to prior in August 2020, MRI of cervical spine with and without contrast was also stable, no new lesions -Follow-up in 6 months or sooner if needed  2.  Progressive worsening gait abnormality -Ordered AFO, right  3.  Worsening urinary and bowel incontinence  4.  Fatigue -Continue Provigil 200 mg daily  5.  Worsening depression -Continue Zoloft 100 mg daily  Butler Denmark, [place], DNP 06/28/2021, 4:21 PM [facility] [address], [place], [place] [address] [phone]

## 2021-06-28 NOTE — Telephone Encounter (Signed)
Can we check with her pharmacy [facility] in [facility] about her Provigil, drug registry shows not filled since 05/2020, she claims has been taking it

## 2021-06-28 NOTE — Addendum Note (Signed)
Addended by: Rosezella [place] on: 06/28/2021 05:03 PM   Modules accepted: Orders

## 2021-06-28 NOTE — Telephone Encounter (Signed)
Pharmacy states last fill was 05/2020

## 2021-06-29 LAB — COMPREHENSIVE METABOLIC PANEL
ALT: 11 IU/L (ref 0–32)
AST: 12 IU/L (ref 0–40)
Albumin/Globulin Ratio: 1.7 (ref 1.2–2.2)
Albumin: 4.5 g/dL (ref 3.8–4.8)
Alkaline Phosphatase: 98 IU/L (ref 44–121)
BUN/Creatinine Ratio: 19 (ref 9–23)
BUN: 21 mg/dL (ref 6–24)
Bilirubin Total: 0.2 mg/dL (ref 0.0–1.2)
CO2: 23 mmol/L (ref 20–29)
Calcium: 9.3 mg/dL (ref 8.7–10.2)
Chloride: 103 mmol/L (ref 96–106)
Creatinine, Ser: 1.1 mg/dL — ABNORMAL HIGH (ref 0.57–1.00)
Globulin, Total: 2.6 g/dL (ref 1.5–4.5)
Glucose: 96 mg/dL (ref 70–99)
Potassium: 4.1 mmol/L (ref 3.5–5.2)
Sodium: 145 mmol/L — ABNORMAL HIGH (ref 134–144)
Total Protein: 7.1 g/dL (ref 6.0–8.5)
eGFR: 64 mL/min/{1.73_m2} (ref >59)

## 2021-06-29 LAB — CBC WITH DIFFERENTIAL/PLATELET
Basophils Absolute: 0 10*3/uL (ref 0.0–0.2)
Basos: 0 %
EOS (ABSOLUTE): 0.1 10*3/uL (ref 0.0–0.4)
Eos: 1 %
Hematocrit: 30.7 % — ABNORMAL LOW (ref 34.0–46.6)
Hemoglobin: 10.4 g/dL — ABNORMAL LOW (ref 11.1–15.9)
Immature Grans (Abs): 0.1 10*3/uL (ref 0.0–0.1)
Immature Granulocytes: 1 %
Lymphocytes Absolute: 1.5 10*3/uL (ref 0.7–3.1)
Lymphs: 14 %
MCH: 29.1 pg (ref 26.6–33.0)
MCHC: 33.9 g/dL (ref 31.5–35.7)
MCV: 86 fL (ref 79–97)
Monocytes Absolute: 0.8 10*3/uL (ref 0.1–0.9)
Monocytes: 8 %
Neutrophils Absolute: 8.2 10*3/uL — ABNORMAL HIGH (ref 1.4–7.0)
Neutrophils: 76 %
Platelets: 431 10*3/uL (ref 150–450)
RBC: 3.57 x10E6/uL — ABNORMAL LOW (ref 3.77–5.28)
RDW: 13.1 % (ref 11.7–15.4)
WBC: 10.6 10*3/uL (ref 3.4–10.8)

## 2021-06-29 LAB — IGG, IGA, IGM
IgA/Immunoglobulin A, Serum: 294 mg/dL (ref 87–352)
IgG (Immunoglobin G), Serum: 1143 mg/dL (ref 586–1602)
IgM (Immunoglobulin M), Srm: 18 mg/dL — ABNORMAL LOW (ref 26–217)

## 2021-07-02 ENCOUNTER — Telehealth: Payer: Self-pay | Admitting: Neurology

## 2021-07-02 ENCOUNTER — Ambulatory Visit: Payer: Medicaid Other | Admitting: Neurology

## 2021-07-02 NOTE — Telephone Encounter (Signed)
Medicare/mcd [facility] community approved for Brain Cervical auth: [id number] (exp. 07/02/21 to 08/16/21)   Order sent to GI, they will reach out to the patient to schedule.

## 2021-07-03 ENCOUNTER — Telehealth: Payer: Self-pay | Admitting: Neurology

## 2021-07-03 ENCOUNTER — Telehealth: Payer: Self-pay

## 2021-07-03 NOTE — Telephone Encounter (Signed)
[employer] community auth: [id number] (exp. 07/02/21 to 08/16/21)

## 2021-07-03 NOTE — Telephone Encounter (Signed)
I have submitted a PA request for Provigil 200mg  on CMM, Key: [id number] - PA Case ID: [id number]. Awaiting determination from [facility].

## 2021-07-03 NOTE — Telephone Encounter (Signed)
Attempted to call pt, LVM for results per DPR. °Ask pt to call back for questions or concerns.  °

## 2021-07-03 NOTE — Telephone Encounter (Signed)
Reviewed labs with Dr .Krista Blue since IgM is slightly low at 18, okay to continue Ocrevus every 6 months at 600 mg dosing since IgG is ok, but will recheck in 6 months. Creatinine is improved at 1.10, continue to ensure drinking enough water, also sodium is up at 145, could be dehydration. HGB stable at 10.4.

## 2021-07-03 NOTE — Telephone Encounter (Signed)
Request Reference Number: [id number]. PROVIGIL TAB 200MG  is approved through 07/03/2022.

## 2021-07-15 ENCOUNTER — Ambulatory Visit
Admission: RE | Admit: 2021-07-15 | Discharge: 2021-07-15 | Disposition: A | Discharge status: Home / Self Care | Payer: Medicaid Other | Source: Ambulatory Visit | Attending: Neurology | Admitting: Neurology

## 2021-07-15 DIAGNOSIS — G35 Multiple sclerosis: Secondary | ICD-10-CM

## 2021-07-15 MED ORDER — GADOBENATE DIMEGLUMINE 529 MG/ML IV SOLN
20.0000 mL | Freq: Once | INTRAVENOUS | Status: AC | PRN
  Administered 2021-07-15: 20 mL via INTRAVENOUS

## 2021-07-16 ENCOUNTER — Telehealth: Payer: Self-pay | Admitting: *Deleted

## 2021-07-16 NOTE — Telephone Encounter (Signed)
-----   Message from Glean Salvo, NP sent at 07/16/2021  2:23 PM EST ----- Please call the patient, MRI of cervical spine is stable, no new MS lesions. MRI brain is pending.   IMPRESSION: 1. Stable appearance of patchy signal abnormality throughout the cervical spinal cord, consistent with history of multiple sclerosis. No new lesions to suggest significant disease progression. No enhancement to suggest active demyelination. 2. Mild multilevel cervical spondylosis as above. No significant spinal stenosis or evidence for neural impingement.

## 2021-07-16 NOTE — Telephone Encounter (Signed)
Spoke with patient and informed of MRI cervical spine results. Patient verbalized understanding and expressed appreciation for the call. All questions answered.

## 2021-07-17 ENCOUNTER — Telehealth: Payer: Self-pay | Admitting: *Deleted

## 2021-07-17 NOTE — Telephone Encounter (Signed)
-----   Message from Glean Salvo, NP sent at 07/17/2021  5:57 AM EST ----- Please call the patient, MRI of the brain appears stable, no significant change from previous MRI in Dec 2021. Continue with current treatment plan. Let me know if they have any questions! Thanks!!

## 2021-07-17 NOTE — Telephone Encounter (Signed)
Called patient and informed of MRI brain results. Patient verbalized understanding and expressed appreciation for the call. All questions answered.

## 2021-09-18 ENCOUNTER — Other Ambulatory Visit: Payer: Self-pay | Admitting: Neurology

## 2021-09-18 DIAGNOSIS — G35 Multiple sclerosis: Secondary | ICD-10-CM

## 2021-09-18 NOTE — Progress Notes (Signed)
Orders for Ocrevus placed.  ?

## 2021-09-21 ENCOUNTER — Encounter (HOSPITAL_COMMUNITY): Payer: Medicaid Other

## 2021-10-16 ENCOUNTER — Emergency Department (HOSPITAL_BASED_OUTPATIENT_CLINIC_OR_DEPARTMENT_OTHER)
Admission: EM | Admit: 2021-10-16 | Discharge: 2021-10-16 | Disposition: A | Discharge status: Home / Self Care | Payer: Medicare Other | Attending: Emergency Medicine | Admitting: Emergency Medicine

## 2021-10-16 ENCOUNTER — Other Ambulatory Visit: Payer: Self-pay

## 2021-10-16 ENCOUNTER — Encounter (HOSPITAL_BASED_OUTPATIENT_CLINIC_OR_DEPARTMENT_OTHER): Payer: Self-pay | Admitting: Radiology

## 2021-10-16 DIAGNOSIS — Z79899 Other long term (current) drug therapy: Secondary | ICD-10-CM | POA: Insufficient documentation

## 2021-10-16 DIAGNOSIS — U071 COVID-19: Secondary | ICD-10-CM

## 2021-10-16 DIAGNOSIS — I10 Essential (primary) hypertension: Secondary | ICD-10-CM | POA: Diagnosis not present

## 2021-10-16 DIAGNOSIS — R059 Cough, unspecified: Secondary | ICD-10-CM | POA: Diagnosis present

## 2021-10-16 LAB — COMPREHENSIVE METABOLIC PANEL
ALT: 12 U/L (ref 0–44)
AST: 16 U/L (ref 15–41)
Albumin: 4.1 g/dL (ref 3.5–5.0)
Alkaline Phosphatase: 91 U/L (ref 38–126)
Anion gap: 7 (ref 5–15)
BUN: 23 mg/dL — ABNORMAL HIGH (ref 6–20)
CO2: 26 mmol/L (ref 22–32)
Calcium: 9.3 mg/dL (ref 8.9–10.3)
Chloride: 103 mmol/L (ref 98–111)
Creatinine, Ser: 1.37 mg/dL — ABNORMAL HIGH (ref 0.44–1.00)
GFR, Estimated: 49 mL/min — ABNORMAL LOW (ref >60)
Glucose, Bld: 95 mg/dL (ref 70–99)
Potassium: 3.9 mmol/L (ref 3.5–5.1)
Sodium: 136 mmol/L (ref 135–145)
Total Bilirubin: 0.6 mg/dL (ref 0.3–1.2)
Total Protein: 8 g/dL (ref 6.5–8.1)

## 2021-10-16 LAB — CBC WITH DIFFERENTIAL/PLATELET
Abs Immature Granulocytes: 0.03 10*3/uL (ref 0.00–0.07)
Basophils Absolute: 0 10*3/uL (ref 0.0–0.1)
Basophils Relative: 0 %
Eosinophils Absolute: 0.1 10*3/uL (ref 0.0–0.5)
Eosinophils Relative: 1 %
HCT: 34.3 % — ABNORMAL LOW (ref 36.0–46.0)
Hemoglobin: 11.3 g/dL — ABNORMAL LOW (ref 12.0–15.0)
Immature Granulocytes: 0 %
Lymphocytes Relative: 8 %
Lymphs Abs: 0.8 10*3/uL (ref 0.7–4.0)
MCH: 28.9 pg (ref 26.0–34.0)
MCHC: 32.9 g/dL (ref 30.0–36.0)
MCV: 87.7 fL (ref 80.0–100.0)
Monocytes Absolute: 0.6 10*3/uL (ref 0.1–1.0)
Monocytes Relative: 6 %
Neutro Abs: 8.2 10*3/uL — ABNORMAL HIGH (ref 1.7–7.7)
Neutrophils Relative %: 85 %
Platelets: 427 10*3/uL — ABNORMAL HIGH (ref 150–400)
RBC: 3.91 MIL/uL (ref 3.87–5.11)
RDW: 14.2 % (ref 11.5–15.5)
WBC: 9.7 10*3/uL (ref 4.0–10.5)
nRBC: 0 % (ref 0.0–0.2)

## 2021-10-16 LAB — RESP PANEL BY RT-PCR (FLU A&B, COVID) ARPGX2
Influenza A by PCR: NEGATIVE
Influenza B by PCR: NEGATIVE
SARS Coronavirus 2 by RT PCR: POSITIVE — AB

## 2021-10-16 MED ORDER — MOLNUPIRAVIR EUA 200MG CAPSULE: 4.0000 | ORAL_CAPSULE | Freq: Two times a day (BID) | ORAL | 0 refills | Status: AC

## 2021-10-16 NOTE — ED Triage Notes (Signed)
Pt states that she has had a cough since Monday. Pt son had a + COVID today. ?

## 2021-10-16 NOTE — ED Provider Notes (Signed)
? ?[facility] ?Provider Note: Julie Dell, MD, FACEP ? ?CSN: [id number] ?MRN: [id number] ?ARRIVAL: 10/16/21 at 2000 ?ROOM: [facility]1/[facility]1 ? ? ?CHIEF COMPLAINT  ?Cough ? ? ?HISTORY OF PRESENT ILLNESS  ?10/16/21 11:33 PM ?Julie Pope is a 41 y.o. female with a cough since yesterday.  It became as severe earlier but resolved after exposure to cooler air.  She denies shortness of breath, nasal congestion, sore throat, loss of taste or smell, nausea, vomiting or diarrhea.  Her son had a positive COVID test today. ? ? ?Past Medical History:  ?Diagnosis Date  ? Abnormality of gait   ? Hypertension   ? MS (multiple sclerosis) ([facility])   ? Other nonspecific abnormal result of function study of brain and central nervous system   ? Personal history of fall   ? ? ?Past Surgical History:  ?Procedure Laterality Date  ? CHOLECYSTECTOMY    ? GALLBLADDER SURGERY    ? ? ?Family History  ?Problem Relation Age of Onset  ? Cancer Maternal Grandmother   ? High blood pressure Other   ?     Runs in both sides of the family  ? ? ?Social History  ? ?Tobacco Use  ? Smoking status: Never  ? Smokeless tobacco: Never  ?Vaping Use  ? Vaping Use: Never used  ?Substance Use Topics  ? Alcohol use: No  ? Drug use: No  ? ? ?Prior to Admission medications   ?Medication Sig Start Date End Date Taking? Authorizing Provider  ?molnupiravir EUA (LAGEVRIO) 200 mg CAPS capsule Take 4 capsules (800 mg total) by mouth 2 (two) times daily for 5 days. 10/16/21 10/21/21 Yes Edia Pursifull, MD  ?atenolol (TENORMIN) 50 MG tablet Take 1 tablet (50 mg total) by mouth daily. 03/13/17   Levert Feinstein, MD  ?calcium citrate (CALCITRATE - DOSED IN MG ELEMENTAL CALCIUM) 950 MG tablet Take 1 tablet by mouth daily.   05/14/10   [provider]  ?Cholecalciferol (VITAMIN D3) 1000 units CAPS Take by mouth daily.    [provider]  ?lisinopril (PRINIVIL,ZESTRIL) 40 MG tablet TAKE 1/2 TABLET BY MOUTH EVERY DAY 08/12/18   Levert Feinstein, MD  ?ocrelizumab 600 mg  in sodium chloride 0.9 % 500 mL Inject 600 mg into the vein every 6 (six) months.    [provider]  ?ocrelizumab 600 mg in sodium chloride 0.9 % 500 mL Inject 600 mg into the vein every 6 (six) months. 09/16/19   Levert Feinstein, MD  ?OCREVUS 300 MG/10ML injection INFUSE  EVERY SIX MONTHS. 03/07/20   Levert Feinstein, MD  ?PROVIGIL 200 MG tablet Take 1 tablet (200 mg total) by mouth daily. 06/28/21   Glean Salvo, NP  ?sertraline (ZOLOFT) 100 MG tablet Take 1 tablet (100 mg total) by mouth daily. 12/25/20   Levert Feinstein, MD  ? ? ?Allergies ?Patient has no known allergies. ? ? ?REVIEW OF SYSTEMS  ?Negative except as noted here or in the History of Present Illness. ? ? ?PHYSICAL EXAMINATION  ?Initial Vital Signs ?Blood pressure 122/87, pulse (!) 113, temperature 99 ?F (37.2 ?C), temperature source Oral, resp. rate 16, height  (1.626 m), weight 93 kg, SpO2 100 %. ? ?Examination ?General: Well-developed, well-nourished female in no acute distress; appearance consistent with age of record ?HENT: normocephalic; atraumatic ?Eyes: Normal appearance ?Neck: supple ?Heart: regular rate and rhythm ?Lungs: clear to auscultation bilaterally ?Abdomen: soft; nondistended; nontender; bowel sounds present ?Extremities: No deformity; full range of motion ?Neurologic: Awake, alert and oriented;  motor function intact in all extremities and symmetric; no facial droop ?Skin: Warm and dry ?Psychiatric: Normal mood and affect ? ? ?RESULTS  ?Summary of this visit's results, reviewed and interpreted by myself: ? ? EKG Interpretation ? ?Date/Time:    ?Ventricular Rate:    ?PR Interval:    ?QRS Duration:   ?QT Interval:    ?QTC Calculation:   ?R Axis:     ?Text Interpretation:   ?  ? ?  ? ?Laboratory Studies: ?Results for orders placed or performed during the hospital encounter of 10/16/21 (from the past 24 hour(s))  ?Resp Panel by RT-PCR (Flu A&B, Covid) Nasopharyngeal Swab     Status: Abnormal  ? Collection Time: 10/16/21  8:15 PM  ?  Specimen: Nasopharyngeal Swab; Nasopharyngeal(NP) swabs in vial transport medium  ?Result Value Ref Range  ? SARS Coronavirus 2 by RT PCR POSITIVE (A) NEGATIVE  ? Influenza A by PCR NEGATIVE NEGATIVE  ? Influenza B by PCR NEGATIVE NEGATIVE  ?CBC with Differential     Status: Abnormal  ? Collection Time: 10/16/21  8:26 PM  ?Result Value Ref Range  ? WBC 9.7 4.0 - 10.5 K/uL  ? RBC 3.91 3.87 - 5.11 MIL/uL  ? Hemoglobin 11.3 (L) 12.0 - 15.0 g/dL  ? HCT 34.3 (L) 36.0 - 46.0 %  ? MCV 87.7 80.0 - 100.0 fL  ? MCH 28.9 26.0 - 34.0 pg  ? MCHC 32.9 30.0 - 36.0 g/dL  ? RDW 14.2 11.5 - 15.5 %  ? Platelets 427 (H) 150 - 400 K/uL  ? nRBC 0.0 0.0 - 0.2 %  ? Neutrophils Relative % 85 %  ? Neutro Abs 8.2 (H) 1.7 - 7.7 K/uL  ? Lymphocytes Relative 8 %  ? Lymphs Abs 0.8 0.7 - 4.0 K/uL  ? Monocytes Relative 6 %  ? Monocytes Absolute 0.6 0.1 - 1.0 K/uL  ? Eosinophils Relative 1 %  ? Eosinophils Absolute 0.1 0.0 - 0.5 K/uL  ? Basophils Relative 0 %  ? Basophils Absolute 0.0 0.0 - 0.1 K/uL  ? Immature Granulocytes 0 %  ? Abs Immature Granulocytes 0.03 0.00 - 0.07 K/uL  ?Comprehensive metabolic panel     Status: Abnormal  ? Collection Time: 10/16/21  8:26 PM  ?Result Value Ref Range  ? Sodium 136 135 - 145 mmol/L  ? Potassium 3.9 3.5 - 5.1 mmol/L  ? Chloride 103 98 - 111 mmol/L  ? CO2 26 22 - 32 mmol/L  ? Glucose, Bld 95 70 - 99 mg/dL  ? BUN 23 (H) 6 - 20 mg/dL  ? Creatinine, Ser 1.37 (H) 0.44 - 1.00 mg/dL  ? Calcium 9.3 8.9 - 10.3 mg/dL  ? Total Protein 8.0 6.5 - 8.1 g/dL  ? Albumin 4.1 3.5 - 5.0 g/dL  ? AST 16 15 - 41 U/L  ? ALT 12 0 - 44 U/L  ? Alkaline Phosphatase 91 38 - 126 U/L  ? Total Bilirubin 0.6 0.3 - 1.2 mg/dL  ? GFR, Estimated 49 (L) >60 mL/min  ? Anion gap 7 5 - 15  ? ?Imaging Studies: ?No results found. ? ?ED COURSE and MDM  ?Nursing notes, initial and subsequent vitals signs, including pulse oximetry, reviewed and interpreted by myself. ? ?Vitals:  ? 10/16/21 2007 10/16/21 2007 10/16/21 2241  ?BP:  123/82 122/87  ?Pulse:   (!) 124 (!) 113  ?Resp:   16  ?Temp:  99 ?F (37.2 ?C)   ?TempSrc:  Oral   ?SpO2:  100% 100%  ?  Weight: 93 kg    ?Height: 5\' 4"  (1.626 m)    ? ?Medications - No data to display ? ?The patient does not clinically appear severely ill but she does have risk factors (multiple sclerosis) that I believe justifies the use of a COVID antiviral. ? ?PROCEDURES  ?Procedures ? ? ?ED DIAGNOSES  ? ?  ICD-10-CM   ?1. COVID-19 virus infection  U07.1   ?  ? ? ? ?  ? , MD ?10/16/21 2341 ? ?

## 2021-12-13 ENCOUNTER — Telehealth: Payer: Self-pay

## 2021-12-13 NOTE — Telephone Encounter (Signed)
(  Key: [id number])  Your information has been sent to [employer].

## 2021-12-17 NOTE — Telephone Encounter (Signed)
Received this message in regards to this PA  We have assigned a new case ID to this request, and it is now pending review. You may contact the Prior Authorization Department at [phone] for further questions.

## 2021-12-17 NOTE — Telephone Encounter (Signed)
Received notification from [facility] [phone]) that PA approved is already on file.  [id number] approved through 07/03/2022.  Pt [id number] O.

## 2022-01-02 NOTE — Progress Notes (Unsigned)
PATIENT: KATTALEYA Pope DOB: [birth year]  REASON FOR VISIT: Follow up for MS HISTORY FROM: Patient, mother PRIMARY NEUROLOGIST: Dr. Terrace Arabia   HISTORY  She has history of hypertension, in May 18, 2010,while walking towards her car at the end of her working day, her legs give out underneath her, she fell face down on the concrete pavement at her Parking lot infront of [employer], knocked out her front teeth.    MRI of the brain,in May 19, 2010 without contrast, demonstrated diffuse white matter changes throughout the supratentorial and infratentorial region, with corpus callosum involvement, MRI scan of the cervical spine shows demyelinating enhancing plaques at C 3 on right and C 4-5 on left. consistent with MS    Repeat MRI scan of the brain in 2012, showed multiple brainstem, cerebellar, subcortical, corpus callosal and periventricular white matter lesions typical for demyelinating disease. Several T 1 black holes and enhancing lesions are seen bilaterally suggesting chronic and active disease. Significant progression and worsening compared with noncontrast MRI 05/19/10.  CSF showed elevated Ig G index, 9 ocb, extensive lab test for mimic were negative, including HIV, RPR, b12 b6, ACE lyme SSA, B, CBC, CMP, PEP, ana, copper, mild ele crp 6.5, and very low vit D<4.    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 In 05/17/13,  index value was 1.17. Positive with titer of 1.6 9 in June 07 2014   Over the years, repeat MRI of the brain and cervical spine showed significant lesion load, but there was no significant contrast-enhancement   Since 2017, she began to  notice gradual worsening gait abnormality, more weakness at right upper and lower extremity,   Had a trial of baclofen for lower extremity spasticity with increased fatigue, no significant help in a progressive gait abnormality.    She began to receive ocrelizumab infusion since February 2020 because of worsening gait abnormality, spasticity, in addition, she also complains of increased fatigue, bowel and bladder incontinence on a daily basis, developed right foot drop, has been relying on her walker since 2019, frequent falling, also complains of worsening depression,   I have personally reviewed most recent MRI of the brain, cervical spine in August 2020,   multiple T2 hyperintensity foci within the spinal cord, adjacent to C1, C2, C3, C4-5, C5-6             MRI of brain showed multiple scattered periventricular, chest cortical, pericallosal, pontine, cerebral peduncle, right cerebellar white matter hyperintensity lesions, no contrast-enhancement, no significant change compared to previous scan in 2019,   Laboratory evaluation showed normal IgG 1380, IgA mildly elevated 425, normal IgM 29,CMP, creatinine was mildly elevated 1.03, nondetectable CD19 and 20 normal TSH, CBC,    UPDATE May 05 2020: She came in with her mother, she continue with ocrelizumab, continue have worsening gait abnormality, worsening urinary and bowel incontinence   Last MRI of the brain was in August 2020, I personally reviewed films, multiple cervical spinal cord involvement, scattered at C1-C5 6 level, MRI of the brain showed multiple MS lesions and periventricular, juxtacortical, pericallosal, pontine, cerebral peduncle, right cerebellar white matter, multiple T1 black  holes, there was no contrast-enhancement   Update December 27, 2020: She got disability finally, overall doing well, tolerating ocrelizumab infusion, continue have gait abnormality, right side has more difficulty, rely on walker for longer distance, also has  fatigue, urinary urgency, incontinence  Update June 28, 2021 SS: Here today with mom, next Julie Pope is in April, goes to the hospital. Few weeks before tired, dragging right foot, AFO doesn't fit. Lives with mom, uses walker. Fall going to the car, right foot got hung. Still urinary and bowel urgency, but better. Had breast biopsy done, was benign.  Labs in July 2022 showed IgM slightly low 23,  Update January 03, 2022 SS: In February 2023, MRI cervical spine and brain are stable, no new lesions. Last Ocrevus was in April 2023. Needs refills on Provigil. Sometimes stool urgency/constipation. Using walker, no falls. Vision is fine. Right sided chronically weaker. Takes Zoloft 100 mg AM, mood is better. Working on weight loss, but walking tires you out. Is in a lot better spirits. Is getting disability now for last year.   06/28/21 IgM slightly low at 18, normal IgG 1143 12/26/21 Hgb 10.9, B12 693, ferritin 453  REVIEW OF SYSTEMS: Out of a complete 14 system review of symptoms, the patient complains only of the following symptoms, and all other reviewed systems are negative.  See HPI  ALLERGIES: No Known Allergies  HOME MEDICATIONS: Outpatient Medications Prior to Visit  Medication Sig Dispense Refill   atenolol (TENORMIN) 50 MG tablet Take 1 tablet (50 mg total) by mouth daily. 90 tablet 4   calcium citrate (CALCITRATE - DOSED IN MG ELEMENTAL CALCIUM) 950 MG tablet Take 1 tablet by mouth daily.       Cholecalciferol (VITAMIN D3) 1000 units CAPS Take by mouth daily.     lisinopril (PRINIVIL,ZESTRIL) 40 MG tablet TAKE 1/2 TABLET BY MOUTH EVERY DAY 30 tablet 5   ocrelizumab 600 mg in sodium chloride 0.9 % 500 mL Inject 600 mg into the vein every 6 (six) months.     ocrelizumab 600 mg in sodium chloride 0.9 % 500 mL Inject 600 mg into the vein every 6 (six) months. 1 each 5   OCREVUS 300 MG/10ML injection INFUSE 600MG  EVERY SIX MONTHS. 20 mL 0   PROVIGIL 200 MG tablet Take 1 tablet (200 mg  total) by mouth daily. 90 tablet 1   sertraline (ZOLOFT) 100 MG tablet Take 1 tablet (100 mg total) by mouth daily. 90 tablet 4   No facility-administered medications prior to visit.    PAST MEDICAL HISTORY: Past Medical History:  Diagnosis Date   Abnormality of gait    Hypertension    MS (multiple sclerosis) (HCC)    Other nonspecific abnormal result of function study of brain and central nervous system    Personal history of fall     PAST SURGICAL HISTORY: Past Surgical History:  Procedure Laterality Date   CHOLECYSTECTOMY     GALLBLADDER SURGERY      FAMILY HISTORY: Family History  Problem Relation Age of Onset   Cancer Maternal Grandmother    High blood pressure Other        Runs in both sides of the family    SOCIAL HISTORY: Social History   Socioeconomic History   Marital status: Single    Spouse name: Not on file   Number of children: 2   Years of education: 12   Highest education level: Not on file  Occupational History   Occupation: FILE Solicitor  Employer: [employer]  Tobacco Use   Smoking status: Never   Smokeless tobacco: Never  Vaping Use   Vaping Use: Never used  Substance and Sexual Activity   Alcohol use: No   Drug use: No   Sexual activity: Not Currently  Other Topics Concern   Not on file  Social History Narrative   Patient has a high school education and two children. Patient is not currently working.   Patient is right-handed.   Patient drinks three cups of tea daily and two cans of soda daily.   Social Determinants of Health   Financial Resource Strain: Not on file  Food Insecurity: Not on file  Transportation Needs: Not on file  Physical Activity: Not on file  Stress: Not on file  Social Connections: Not on file  Intimate Partner Violence: Not on file   PHYSICAL EXAM  Vitals:   01/03/22 1421  BP: 111/78  Pulse: 81  Weight: 204 lb (92.5 kg)  Height: 5\' 4"  (1.626 m)    Body mass index is 35.02  kg/m.  Generalized: Well developed, in no acute distress   Neurological examination  Mentation: Alert oriented to time, place, history taking. Follows all commands speech and language fluent Cranial nerve II-XII: Pupils were equal round reactive to light. Extraocular movements were full but with lateral rotation there is rotary nystagmus, visual field were full on confrontational test. Facial sensation and strength were normal.  Head turning and shoulder shrug  were normal and symmetric. Motor: Mild right upper extremity weakness, 4/5 weakness right lower extremity, right foot drop Sensory: Sensory testing is intact to soft touch on all 4 extremities. No evidence of extinction is noted.  Coordination: Cerebellar testing reveals good finger-nose-finger and heel-to-shin bilaterally.  Gait and station: Has to push off from seated position to stand, gait is wide-based, unsteady, relies on walker, favors right leg Reflexes: Deep tendon reflexes are symmetric but slightly increased   DIAGNOSTIC DATA (LABS, IMAGING, TESTING) - I reviewed patient records, labs, notes, testing and imaging myself where available.  Lab Results  Component Value Date   WBC 9.7 10/16/2021   HGB 11.3 (L) 10/16/2021   HCT 34.3 (L) 10/16/2021   MCV 87.7 10/16/2021   PLT 427 (H) 10/16/2021      Component Value Date/Time   NA 136 10/16/2021 2026   NA 145 (H) 06/28/2021 1613   K 3.9 10/16/2021 2026   CL 103 10/16/2021 2026   CO2 26 10/16/2021 2026   GLUCOSE 95 10/16/2021 2026   BUN 23 (H) 10/16/2021 2026   BUN 21 06/28/2021 1613   CREATININE 1.37 (H) 10/16/2021 2026   CALCIUM 9.3 10/16/2021 2026   PROT 8.0 10/16/2021 2026   PROT 7.1 06/28/2021 1613   ALBUMIN 4.1 10/16/2021 2026   ALBUMIN 4.5 06/28/2021 1613   AST 16 10/16/2021 2026   ALT 12 10/16/2021 2026   ALKPHOS 91 10/16/2021 2026   BILITOT 0.6 10/16/2021 2026   BILITOT 0.2 06/28/2021 1613   GFRNONAA 49 (L) 10/16/2021 2026   GFRAA 60 11/02/2019 1129    No results found for: "CHOL", "HDL", "LDLCALC", "LDLDIRECT", "TRIG", "CHOLHDL" No results found for: "HGBA1C" No results found for: "VITAMINB12" Lab Results  Component Value Date   TSH 0.838 12/25/2020   ASSESSMENT AND PLAN 41 y.o. year old female   1.  Relapsing remitting multiple sclerosis since 2011 2.  Progressive worsening gait abnormality -Courtlyn is overall doing well, no worsening of MS -Continue Ocrevus, check labs today -MRI  of the brain and cervical spine with and without contrast in February 2023 were overall stable, no new lesions -On Tysabri infusion from February 2012 to December 2018 -On Ocrevus since February 2019 -Follow-up in 6 months with Dr. Terrace Arabia for revisit   3.  Worsening urinary and bowel incontinence -Increase fiber intake to bulk the stools  4.  Fatigue -Continue Provigil 200 mg daily, will refill   5.  Worsening depression -Continue Zoloft 100 mg daily  Margie Ege, [place], DNP 01/03/2022, 3:24 PM [facility] [address], [address] [place], [place] [address] [phone]

## 2022-01-03 ENCOUNTER — Ambulatory Visit (INDEPENDENT_AMBULATORY_CARE_PROVIDER_SITE_OTHER): Payer: Medicare Other | Admitting: Neurology

## 2022-01-03 ENCOUNTER — Encounter: Payer: Self-pay | Admitting: Neurology

## 2022-01-03 VITALS — BP 111/78 | HR 81 | Ht 64.0 in | Wt 204.0 lb

## 2022-01-03 DIAGNOSIS — R269 Unspecified abnormalities of gait and mobility: Secondary | ICD-10-CM | POA: Diagnosis not present

## 2022-01-03 DIAGNOSIS — R5383 Other fatigue: Secondary | ICD-10-CM | POA: Diagnosis not present

## 2022-01-03 DIAGNOSIS — G35 Multiple sclerosis: Secondary | ICD-10-CM

## 2022-01-03 MED ORDER — PROVIGIL 200 MG PO TABS: 200.0000 mg | ORAL_TABLET | Freq: Every day | ORAL | 1 refills | Status: DC

## 2022-01-03 NOTE — Patient Instructions (Signed)
Check labs today  Increase fiber intake Continue Ocrevus See you back in 6 months

## 2022-01-04 LAB — COMPREHENSIVE METABOLIC PANEL WITH GFR
ALT: 10 IU/L (ref 0–32)
AST: 12 IU/L (ref 0–40)
Albumin/Globulin Ratio: 1.4 (ref 1.2–2.2)
Albumin: 4.6 g/dL (ref 3.9–4.9)
Alkaline Phosphatase: 103 IU/L (ref 44–121)
BUN/Creatinine Ratio: 13 (ref 9–23)
BUN: 17 mg/dL (ref 6–24)
Bilirubin Total: 0.4 mg/dL (ref 0.0–1.2)
CO2: 23 mmol/L (ref 20–29)
Calcium: 9.8 mg/dL (ref 8.7–10.2)
Chloride: 100 mmol/L (ref 96–106)
Creatinine, Ser: 1.27 mg/dL — ABNORMAL HIGH (ref 0.57–1.00)
Globulin, Total: 3.2 g/dL (ref 1.5–4.5)
Glucose: 88 mg/dL (ref 70–99)
Potassium: 4.5 mmol/L (ref 3.5–5.2)
Sodium: 138 mmol/L (ref 134–144)
Total Protein: 7.8 g/dL (ref 6.0–8.5)
eGFR: 54 mL/min/1.73 — ABNORMAL LOW

## 2022-01-04 LAB — IGG, IGA, IGM
IgA/Immunoglobulin A, Serum: 419 mg/dL — ABNORMAL HIGH (ref 87–352)
IgG (Immunoglobin G), Serum: 1314 mg/dL (ref 586–1602)
IgM (Immunoglobulin M), Srm: 37 mg/dL (ref 26–217)

## 2022-01-08 ENCOUNTER — Telehealth: Payer: Self-pay

## 2022-01-08 NOTE — Telephone Encounter (Signed)
-----   Message from Glean Salvo, NP sent at 01/08/2022  7:35 AM EDT ----- Please call the patient, blood work is stable, shows no significant abnormalities, she should make sure she is drinking enough water, with stable slightly high creatinine 1.27.  We will continue Ocrevus every 31-month intervals.  Please let me know if she has any questions.

## 2022-01-08 NOTE — Telephone Encounter (Signed)
Pt verified by name and DOB, results given per provider, pt voiced understanding all question answered. 

## 2022-01-09 ENCOUNTER — Telehealth: Payer: Self-pay

## 2022-01-09 NOTE — Telephone Encounter (Addendum)
I attempted to complete the PA for Provigil through pt's insurance on file and CMM.   Member eligibility could not be found.  Will have to call plan.   I called [employer] and discussed PA. Was advised the PA was already on file and good until 12/2022.  PA ref # [id number], nothing further needed at this time.

## 2022-02-19 ENCOUNTER — Other Ambulatory Visit: Payer: Self-pay | Admitting: Neurology

## 2022-02-19 ENCOUNTER — Non-Acute Institutional Stay (HOSPITAL_COMMUNITY)
Admission: RE | Admit: 2022-02-19 | Discharge: 2022-02-19 | Disposition: A | Discharge status: Home / Self Care | Payer: Medicare Other | Source: Ambulatory Visit | Attending: Internal Medicine | Admitting: Internal Medicine

## 2022-02-19 DIAGNOSIS — G35 Multiple sclerosis: Secondary | ICD-10-CM | POA: Diagnosis not present

## 2022-02-19 MED ORDER — METHYLPREDNISOLONE SODIUM SUCC 125 MG IJ SOLR
125.0000 mg | Freq: Once | INTRAMUSCULAR | Status: AC
  Administered 2022-02-19: 125 mg via INTRAVENOUS
  Filled 2022-02-19: qty 2

## 2022-02-19 MED ORDER — FAMOTIDINE IN NACL 20-0.9 MG/50ML-% IV SOLN
20.0000 mg | Freq: Once | INTRAVENOUS | Status: AC
  Administered 2022-02-19: 20 mg via INTRAVENOUS
  Filled 2022-02-19: qty 50

## 2022-02-19 MED ORDER — ACETAMINOPHEN 325 MG PO TABS
650.0000 mg | ORAL_TABLET | Freq: Once | ORAL | Status: AC
  Administered 2022-02-19: 650 mg via ORAL
  Filled 2022-02-19: qty 2

## 2022-02-19 MED ORDER — SODIUM CHLORIDE 0.9 % IV SOLN
600.0000 mg | Freq: Once | INTRAVENOUS | Status: AC
  Administered 2022-02-19: 600 mg via INTRAVENOUS
  Filled 2022-02-19: qty 20

## 2022-02-19 MED ORDER — SODIUM CHLORIDE 0.9 % IV SOLN: INTRAVENOUS | Status: DC | PRN

## 2022-02-19 MED ORDER — DIPHENHYDRAMINE HCL 50 MG/ML IJ SOLN
50.0000 mg | Freq: Once | INTRAMUSCULAR | Status: AC
  Administered 2022-02-19: 50 mg via INTRAVENOUS
  Filled 2022-02-19: qty 1

## 2022-02-19 NOTE — Progress Notes (Signed)
Orders placed for Ocrevus infusion which is scheduled for today.

## 2022-02-19 NOTE — Progress Notes (Signed)
PATIENT CARE CENTER NOTE   Diagnosis: Relapsing remitting multiple sclerosis ([place]) [G35]   Provider: Butler Denmark, NP   Procedure: De Nurse infusion    Note:  Patient received Ocrevus 600 mg infusion via PIV. Pre-medications given per order. Patient tolerated infusion well with no adverse reaction. Observed patient for 30 minutes post infusion. Patient declined to wait for full 1 hour. Vital signs stable. Discharge instructions given. Patient's next appointment scheduled for 08/20/22. Patient alert, oriented and ambulatory at discharge.

## 2022-04-06 IMAGING — MR MR CERVICAL SPINE WO/W CM
6 of 8 series · 32 of 48 positions shown · IV contrast (20ml Multihance)
Comparison: Previous MRI from 05/18/2020.

CLINICAL DATA: Follow-up examination for multiple sclerosis.

EXAM:
MRI CERVICAL SPINE WITHOUT AND WITH CONTRAST
TECHNIQUE: Multiplanar and multiecho pulse sequences of the cervical spine, to
include the craniocervical junction and cervicothoracic junction,
were obtained without and with intravenous contrast.
CONTRAST:  20mL MULTIHANCE GADOBENATE DIMEGLUMINE 529 MG/ML IV SOLN

[Series 2: T1 · sagittal · 3.0mm · 0.90mm/px · 5 of 16 slices shown (1 of 2)]
[im 1/16]
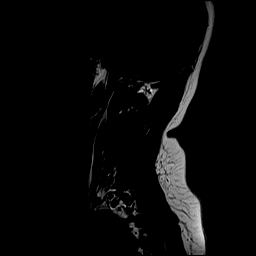
[im 4/16]
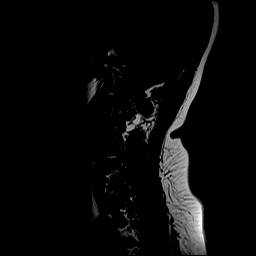
[im 8/16]
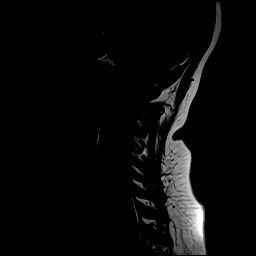
[im 12/16]
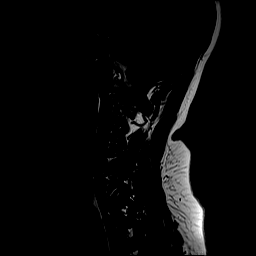
[im 16/16]
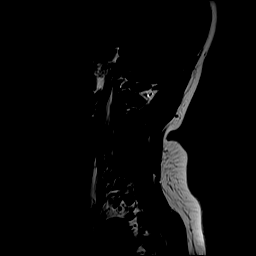

[Series 3: STIR · sagittal · 3.0mm · 0.90mm/px · 3 of 16 slices shown]
[im 1/16]
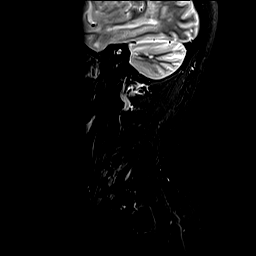
[im 4/16]
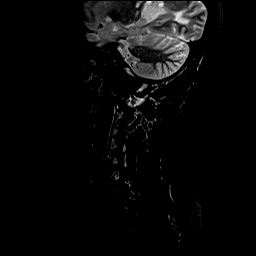
[im 8/16]
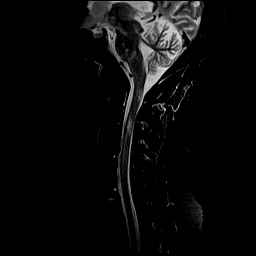

[Series 4: T2 · axial · 3.0mm · 0.70mm/px · z∈[-195,-101]mm · 7 of 26 slices shown (1 of 2)]
[im 1/26]
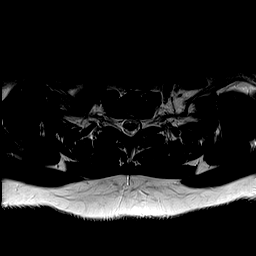
[im 5/26]
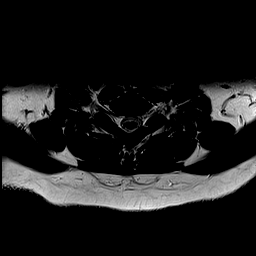
[im 9/26]
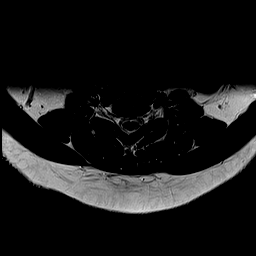
[im 13/26]
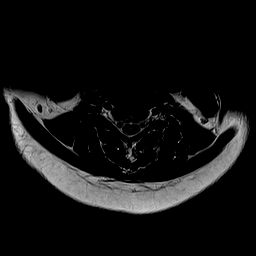
[im 17/26]
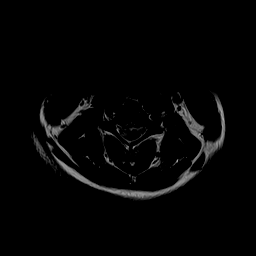
[im 21/26]
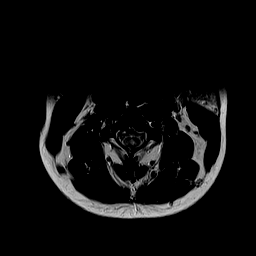
[im 26/26]
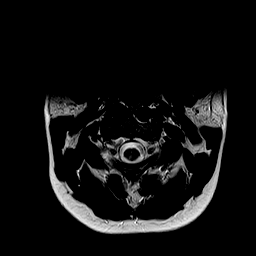

[Series 6: T1 · axial · non-contrast · 3.0mm · 0.35mm/px · z∈[-195,-101]mm · 7 of 26 slices shown (2 of 2)]
[im 1/26]
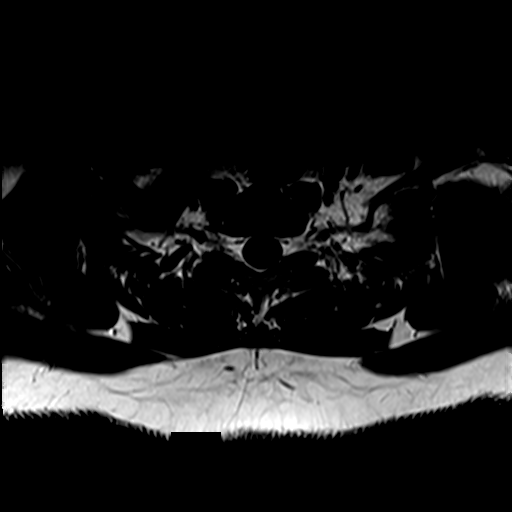
[im 5/26]
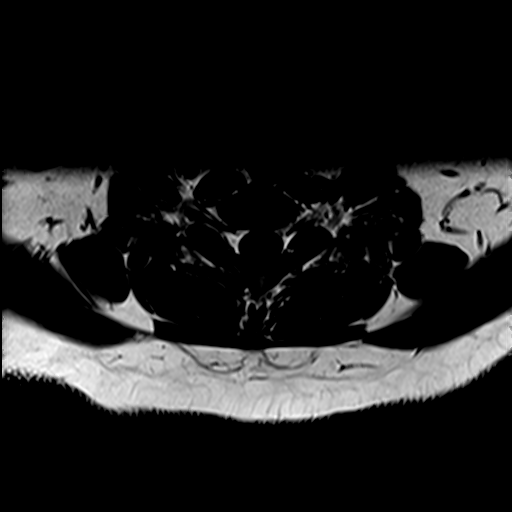
[im 9/26]
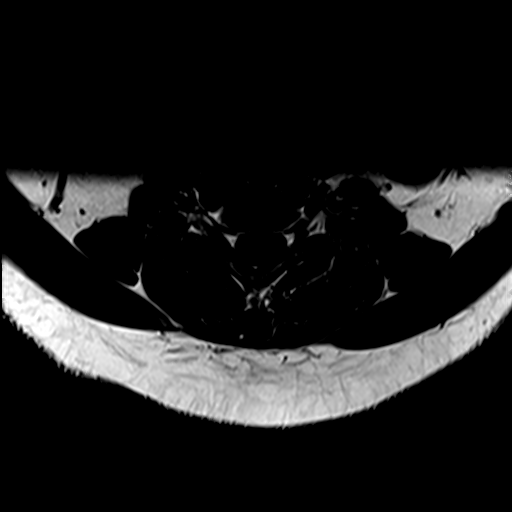
[im 13/26]
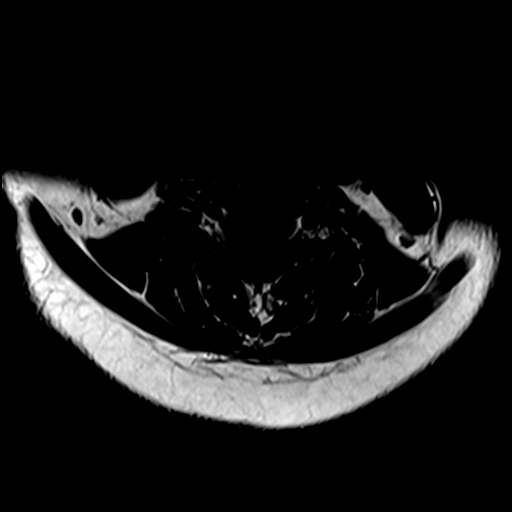
[im 17/26]
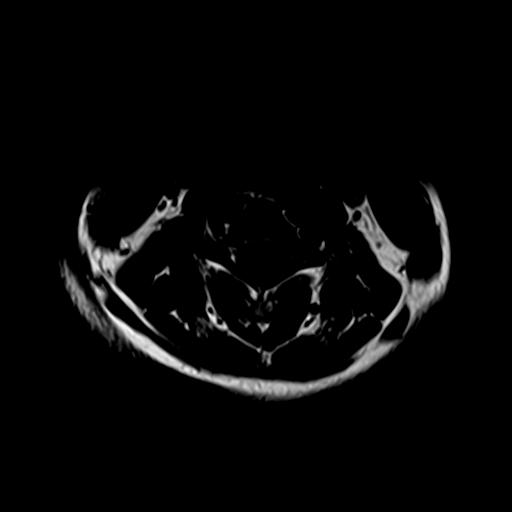
[im 21/26]
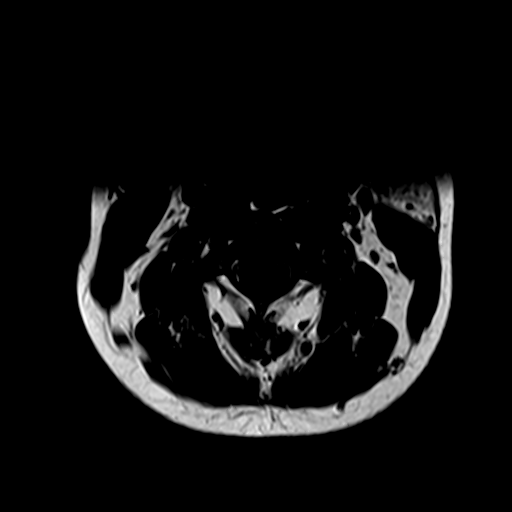
[im 26/26]
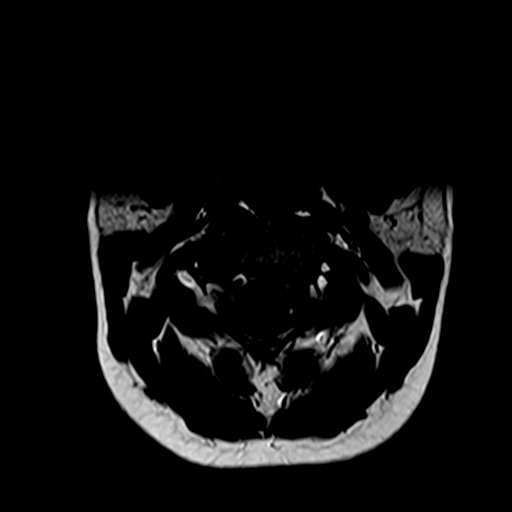

[Series 7: T2 · sagittal · 3.0mm · 0.45mm/px · 5 of 16 slices shown (2 of 2)]
[im 1/16]
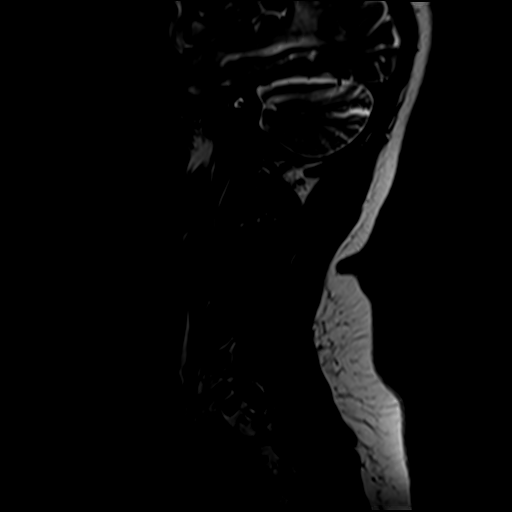
[im 4/16]
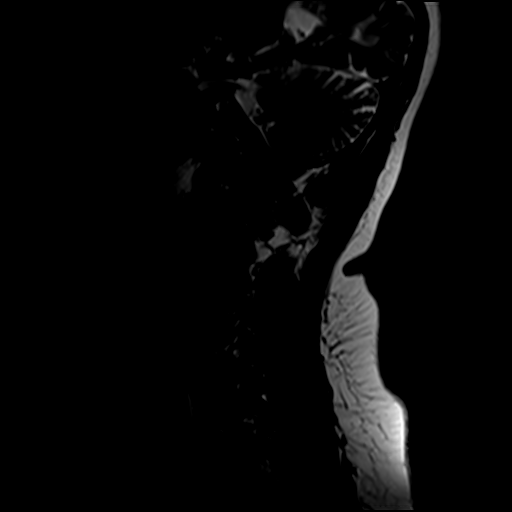
[im 8/16]
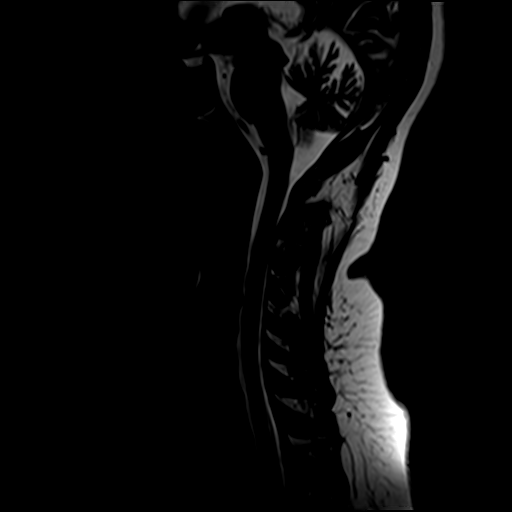
[im 12/16]
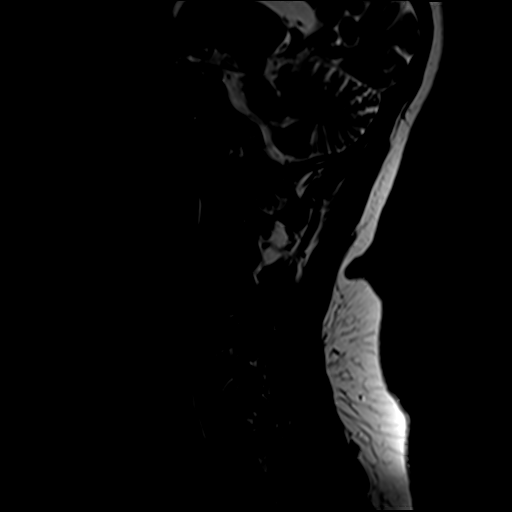
[im 16/16]
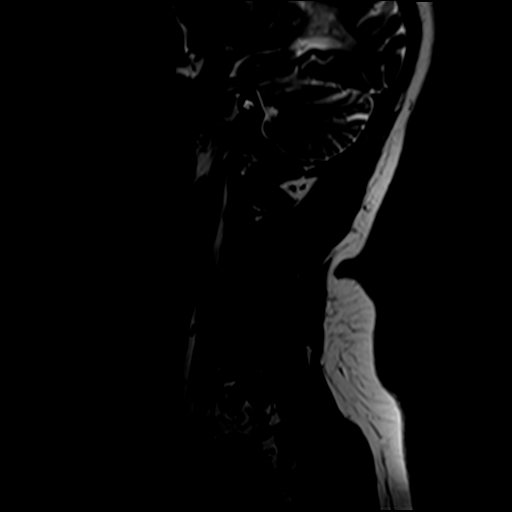

[Series 8: T1 fat-sat post-contrast · sagittal · 3.0mm · 0.90mm/px · 5 of 16 slices shown]
[im 1/16]
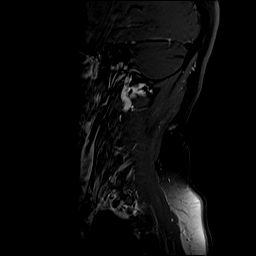
[im 4/16]
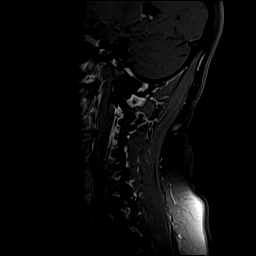
[im 8/16]
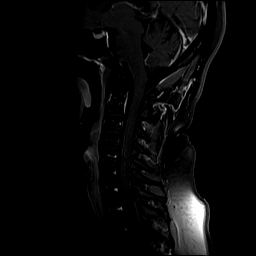
[im 12/16]
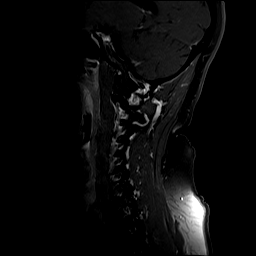
[im 16/16]
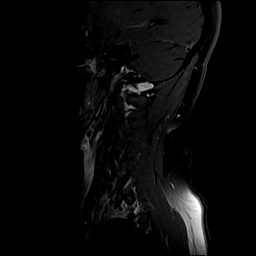

[32 of 48 positions shown; findings below may reference images not displayed]

FINDINGS: Alignment: Straightening of the normal cervical lordosis. No
listhesis.

Vertebrae: Vertebral body height maintained without acute or chronic
fracture. Bone marrow signal intensity within normal limits. No
discrete or worrisome osseous lesions. No abnormal marrow edema or
enhancement.

Cord: Patchy signal abnormality seen involving the right dorsal cord
at the level of C1-2. Signal abnormality seen involving the left
posterior cord at the level of C2-3. Signal abnormality seen
involving the central and right cord at the level of C3-4. Patchy
signal abnormality seen involving the bilateral hemi cord at the
level of C4-5. Patchy signal abnormality involves the left hemi cord
at C5. Overall, appearance is relatively similar as compared to
previous MRI. No new lesions to suggest significant disease
progression. No enhancement to suggest active demyelination. No
significant spinal cord atrophy.

Posterior Fossa, vertebral arteries, paraspinal tissues: Patchy
signal abnormality again noted involving the pons and cerebellum.
Craniocervical junction normal. Paraspinous soft tissues within
normal limits. Normal flow voids seen within the vertebral arteries
bilaterally.

Disc levels:

C2-C3: Unremarkable.

C3-C4: Mild disc bulge with uncovertebral spurring. No spinal
stenosis. Foramina remain patent.

C4-C5: Mild disc bulge with uncovertebral spurring, greater on the
right. No significant spinal stenosis. Mild right C5 foraminal
stenosis. Left neural foramina remains patent.

C5-C6:  Mild annular disc bulge.  No canal or foraminal stenosis.

C6-C7: Normal interspace. No significant canal or foraminal
stenosis.

C7-T1: Normal interspace. Mild facet hypertrophy. No canal or
foraminal stenosis.

Visualized upper thoracic spine and spinal cord demonstrate no
significant finding.
IMPRESSION: 1. Stable appearance of patchy signal abnormality throughout the
cervical spinal cord, consistent with history of multiple sclerosis.
No new lesions to suggest significant disease progression. No
enhancement to suggest active demyelination.
2. Mild multilevel cervical spondylosis as above. No significant
spinal stenosis or evidence for neural impingement.

## 2022-07-08 ENCOUNTER — Ambulatory Visit: Payer: Medicare Other | Admitting: Neurology

## 2022-08-19 ENCOUNTER — Other Ambulatory Visit: Payer: Self-pay | Admitting: Neurology

## 2022-08-19 ENCOUNTER — Telehealth: Payer: Self-pay | Admitting: Neurology

## 2022-08-19 ENCOUNTER — Other Ambulatory Visit (INDEPENDENT_AMBULATORY_CARE_PROVIDER_SITE_OTHER): Payer: Self-pay

## 2022-08-19 DIAGNOSIS — G35 Multiple sclerosis: Secondary | ICD-10-CM

## 2022-08-19 DIAGNOSIS — Z0289 Encounter for other administrative examinations: Secondary | ICD-10-CM

## 2022-08-19 NOTE — Telephone Encounter (Signed)
The patient was unavailable to take a phone call. I was able to leave a detailed voicemail (as per DPR). I have asked that she would come in for lab work before her Ocrevus infusion. I will attempt the call again later today.

## 2022-08-19 NOTE — Telephone Encounter (Signed)
Looks like patient scheduled to have Ocrevus tomorrow morning. Can you ask her to come today to our office to gets labs, IGG, IGA, IGM. I can go ahead and order but would like to have her labs before infusion,   Orders Placed This Encounter  Procedures   CBC with Differential/Platelet   CMP   IgG, IgA, IgM

## 2022-08-19 NOTE — Progress Notes (Signed)
I placed orders for Ocrevus infusion tomorrow. Has office appointment with Dr. Krista Blue 09/09/22. I will ask her to have her labs done today at our office Igg Iga Igm, will be back in the AM.

## 2022-08-19 NOTE — Telephone Encounter (Signed)
I spoke with the patient. She will come by the office around 3:00 PM today for lab draw.

## 2022-08-20 ENCOUNTER — Non-Acute Institutional Stay (HOSPITAL_COMMUNITY)
Admission: RE | Admit: 2022-08-20 | Discharge: 2022-08-20 | Disposition: A | Discharge status: Home / Self Care | Payer: 59 | Source: Ambulatory Visit | Attending: Internal Medicine | Admitting: Internal Medicine

## 2022-08-20 DIAGNOSIS — G35 Multiple sclerosis: Secondary | ICD-10-CM | POA: Insufficient documentation

## 2022-08-20 LAB — CBC WITH DIFFERENTIAL/PLATELET
Basophils Absolute: 0 10*3/uL (ref 0.0–0.2)
Basos: 0 %
EOS (ABSOLUTE): 0.1 10*3/uL (ref 0.0–0.4)
Eos: 1 %
Hematocrit: 32.5 % — ABNORMAL LOW (ref 34.0–46.6)
Hemoglobin: 10.8 g/dL — ABNORMAL LOW (ref 11.1–15.9)
Immature Grans (Abs): 0 10*3/uL (ref 0.0–0.1)
Immature Granulocytes: 0 %
Lymphocytes Absolute: 1.7 10*3/uL (ref 0.7–3.1)
Lymphs: 18 %
MCH: 28.9 pg (ref 26.6–33.0)
MCHC: 33.2 g/dL (ref 31.5–35.7)
MCV: 87 fL (ref 79–97)
Monocytes Absolute: 0.7 10*3/uL (ref 0.1–0.9)
Monocytes: 7 %
Neutrophils Absolute: 6.9 10*3/uL (ref 1.4–7.0)
Neutrophils: 74 %
Platelets: 420 10*3/uL (ref 150–450)
RBC: 3.74 x10E6/uL — ABNORMAL LOW (ref 3.77–5.28)
RDW: 13.3 % (ref 11.7–15.4)
WBC: 9.4 10*3/uL (ref 3.4–10.8)

## 2022-08-20 LAB — COMPREHENSIVE METABOLIC PANEL
ALT: 12 IU/L (ref 0–32)
AST: 8 IU/L (ref 0–40)
Albumin/Globulin Ratio: 1.8 (ref 1.2–2.2)
Albumin: 4.4 g/dL (ref 3.9–4.9)
Alkaline Phosphatase: 94 IU/L (ref 44–121)
BUN/Creatinine Ratio: 12 (ref 9–23)
BUN: 15 mg/dL (ref 6–24)
Bilirubin Total: <0.2 mg/dL (ref 0.0–1.2)
CO2: 22 mmol/L (ref 20–29)
Calcium: 9.5 mg/dL (ref 8.7–10.2)
Chloride: 103 mmol/L (ref 96–106)
Creatinine, Ser: 1.27 mg/dL — ABNORMAL HIGH (ref 0.57–1.00)
Globulin, Total: 2.5 g/dL (ref 1.5–4.5)
Glucose: 90 mg/dL (ref 70–99)
Potassium: 4.3 mmol/L (ref 3.5–5.2)
Sodium: 139 mmol/L (ref 134–144)
Total Protein: 6.9 g/dL (ref 6.0–8.5)
eGFR: 54 mL/min/{1.73_m2} — ABNORMAL LOW (ref >59)

## 2022-08-20 LAB — IGG, IGA, IGM
IgA/Immunoglobulin A, Serum: 345 mg/dL (ref 87–352)
IgG (Immunoglobin G), Serum: 1101 mg/dL (ref 586–1602)
IgM (Immunoglobulin M), Srm: 21 mg/dL — ABNORMAL LOW (ref 26–217)

## 2022-08-20 MED ORDER — FAMOTIDINE IN NACL 20-0.9 MG/50ML-% IV SOLN
20.0000 mg | Freq: Once | INTRAVENOUS | Status: AC
  Administered 2022-08-20: 20 mg via INTRAVENOUS
  Filled 2022-08-20: qty 50

## 2022-08-20 MED ORDER — METHYLPREDNISOLONE SODIUM SUCC 125 MG IJ SOLR
125.0000 mg | Freq: Once | INTRAMUSCULAR | Status: AC
  Administered 2022-08-20: 125 mg via INTRAVENOUS
  Filled 2022-08-20: qty 2

## 2022-08-20 MED ORDER — SODIUM CHLORIDE 0.9 % IV SOLN: INTRAVENOUS | Status: DC | PRN

## 2022-08-20 MED ORDER — ACETAMINOPHEN 325 MG PO TABS
650.0000 mg | ORAL_TABLET | Freq: Once | ORAL | Status: AC
  Administered 2022-08-20: 650 mg via ORAL
  Filled 2022-08-20: qty 2

## 2022-08-20 MED ORDER — DIPHENHYDRAMINE HCL 50 MG/ML IJ SOLN
50.0000 mg | Freq: Once | INTRAMUSCULAR | Status: AC
  Administered 2022-08-20: 50 mg via INTRAVENOUS
  Filled 2022-08-20: qty 1

## 2022-08-20 MED ORDER — SODIUM CHLORIDE 0.9 % IV SOLN
600.0000 mg | Freq: Once | INTRAVENOUS | Status: AC
  Administered 2022-08-20: 600 mg via INTRAVENOUS
  Filled 2022-08-20: qty 20

## 2022-08-20 NOTE — Progress Notes (Signed)
PATIENT CARE CENTER NOTE  Diagnosis: Relapsing remitting multiple sclerosis [facility])    Provider: Butler Denmark, NP  Procedure: Ocrevus 600 mg  Note: Patient received IV Ocrevus 600 mg infusion (dose #1 of 1) via PIV. Pt pre medicated with PO Tylenol, IV Benadryl, IV Solumedrol, and IVPB Pepcid per orders. Infusion titrared per protocol. Tolerated infusion well with no adverse reaction. Vital signs stable. AVS given to pt. Pt advised to schedule next appointment at front desk for 6 months. Alert, oriented and ambulatory at discharge.

## 2022-08-22 ENCOUNTER — Telehealth: Payer: Self-pay

## 2022-08-22 NOTE — Telephone Encounter (Signed)
Called and spoke to patient about results, states she got her ocrevus infusion yesterday and that she will be here for her appointment next month. Pt verbalized understanding. Pt had no questions at this time but was encouraged to call back if questions arise.

## 2022-09-09 ENCOUNTER — Ambulatory Visit (INDEPENDENT_AMBULATORY_CARE_PROVIDER_SITE_OTHER): Payer: 59 | Admitting: Neurology

## 2022-09-09 ENCOUNTER — Encounter: Payer: Self-pay | Admitting: Neurology

## 2022-09-09 VITALS — BP 115/81 | HR 72 | Ht 64.0 in | Wt 200.6 lb

## 2022-09-09 DIAGNOSIS — R269 Unspecified abnormalities of gait and mobility: Secondary | ICD-10-CM | POA: Diagnosis not present

## 2022-09-09 DIAGNOSIS — R5383 Other fatigue: Secondary | ICD-10-CM

## 2022-09-09 DIAGNOSIS — G35 Multiple sclerosis: Secondary | ICD-10-CM

## 2022-09-09 MED ORDER — SERTRALINE HCL 100 MG PO TABS: 100.0000 mg | ORAL_TABLET | Freq: Every day | ORAL | 4 refills | Status: DC

## 2022-09-09 MED ORDER — PROVIGIL 200 MG PO TABS: 200.0000 mg | ORAL_TABLET | Freq: Every day | ORAL | 3 refills | Status: DC

## 2022-09-09 NOTE — Progress Notes (Signed)
ASSESSMENT AND PLAN 42 y.o. year old female   Relapsing remitting multiple sclerosis since 2011 Progressive worsening gait abnormality -Arine is overall doing well, no worsening of MS -Continue Ocrevus,  -MRI of the brain and cervical spine with and without contrast in February 2023 were overall stable, no new lesions, extensive cervical cord involvement -On Tysabri infusion from February 2012 to December 2018 -On Ocrevus since February 2019 Obesity  Referral to weight loss program, encourage moderate exercise, water aerobic Fatigue   Continue Provigil 200 mg daily, will refill   Depression Continue Zoloft 100 mg daily   DIAGNOSTIC DATA (LABS, IMAGING, TESTING) - I reviewed patient records, labs, notes, testing and imaging myself where available.    HISTORY:   She has history of hypertension, in May 18, 2010,while walking towards her car at the end of her working day, her legs give out underneath her, she fell face down on the concrete pavement at her Parking lot infront of [employer], knocked out her front teeth.    MRI of the brain,in May 19, 2010 without contrast, demonstrated diffuse white matter changes throughout the supratentorial and infratentorial region, with corpus callosum involvement, MRI scan of the cervical spine shows demyelinating enhancing plaques at C 3 on right and C 4-5 on left. consistent with MS    Repeat MRI scan of the brain in 2012, showed multiple brainstem, cerebellar, subcortical, corpus callosal and periventricular white matter lesions typical for demyelinating disease. Several T 1 black holes and enhancing lesions are seen bilaterally suggesting chronic and active disease. Significant progression and worsening compared with noncontrast MRI 05/19/10.  CSF showed elevated Ig G index, 9 ocb, extensive lab test for mimic were negative, including HIV, RPR, b12 b6, ACE lyme SSA, B, CBC, CMP, PEP, ana, copper, mild ele crp 6.5, and very low vit  D<4.    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 In 05/17/13,  index value was 1.17. Positive with titer of 1.6 9 in June 07 2014   Over the years, repeat MRI of the brain and cervical spine showed significant lesion load, but there was no significant contrast-enhancement   Since 2017, she began to notice gradual worsening gait abnormality, more weakness at right upper and lower extremity,   Had a trial of baclofen for lower extremity spasticity with increased fatigue, no significant help in a progressive gait abnormality.    She began to receive ocrelizumab infusion since February 2020 because of worsening gait abnormality, spasticity, in addition, she also complains of increased fatigue, bowel and bladder incontinence on a daily basis, developed right foot drop, has been relying on her walker since 2019, frequent falling, also complains of worsening depression,   I have personally reviewed most recent MRI of the brain, cervical spine in August 2020,   multiple T2 hyperintensity foci within the spinal cord, adjacent to C1, C2, C3, C4-5, C5-6             MRI of brain showed multiple scattered periventricular, chest cortical, pericallosal, pontine, cerebral peduncle, right cerebellar white matter hyperintensity lesions, no contrast-enhancement, no significant change compared to previous scan in 2019,  Laboratory evaluation showed normal IgG 1380, IgA mildly elevated 425, normal IgM 29,CMP, creatinine was mildly elevated 1.03, nondetectable CD19 and 20 normal TSH, CBC,    UPDATE May 05 2020: She came in with her mother, she continue with ocrelizumab, continue have worsening gait  abnormality, worsening urinary and bowel incontinence   Last MRI of the brain was in August 2020, I personally reviewed films, multiple cervical spinal cord involvement, scattered at C1-C5 6 level, MRI of the brain showed multiple MS lesions and periventricular, juxtacortical, pericallosal, pontine, cerebral peduncle, right cerebellar white matter, multiple T1 black holes, there was no contrast-enhancement   Update December 27, 2020: She got disability finally, overall doing well, tolerating ocrelizumab infusion, continue have gait abnormality, right side has more difficulty, rely on walker for longer distance, also has fatigue, urinary urgency, incontinence  UPDATE April 8th 2024: She walks with walker now, chronic constipation, no significant bladder incontinence, slow worsening gait abnormality, also bothered by her continuous weight gain,  Personally reviewed MRI of the brain and cervical spine with without contrast from February 2023: Stable supratentorium lesions, also evidence of left pontine involvement, patchy signal abnormality scattered throughout the cervical cord, no contrast-enhancement  Laboratory evaluations, normal IgG, decreased IgM, which is about her baseline level, creatinine was mildly elevated 1.27, EGFR 54, mild anemia hemoglobin of 10.8, which is also about her baseline level, previous laboratory evaluation showed normal B12, iron level,    REVIEW OF SYSTEMS: Out of a complete 14 system review of symptoms, the patient complains only of the following symptoms, and all other reviewed systems are negative.  See HPI  PHYSICAL EXAM  Vitals:   09/09/22 0858  BP: 115/81  Pulse: 72  Weight: 200 lb 9.6 oz (91 kg)  Height: 5\' 4"  (1.626 m)   Body mass index is 34.43 kg/m.   PHYSICAL EXAMNIATION:  Gen: NAD, conversant, well nourised, well groomed                     Cardiovascular: Regular rate rhythm, no peripheral edema, warm, nontender. Eyes: Conjunctivae clear without  exudates or hemorrhage Neck: Supple, no carotid bruits. Pulmonary: Clear to auscultation bilaterally   NEUROLOGICAL EXAM:  MENTAL STATUS: Speech/cognition: Awake, alert oriented to history taking and casual conversation  CRANIAL NERVES: CN II: Visual fields are full to confrontation.  Pupils are round equal and briskly reactive to light. CN III, IV, VI: extraocular movement are normal. No ptosis. CN V: Facial sensation is intact to pinprick in all 3 divisions bilaterally. Corneal responses are intact.  CN VII: Mild shallow right nasolabial fold, CN VIII: Hearing is normal to casual conversation CN IX, X: Palate elevates symmetrically. Phonation is normal. CN XI: Head turning and shoulder shrug are intact CN XII: Tongue is midline with normal movements and no atrophy.  MOTOR: Mild right arm and leg weakness,  REFLEXES: Brisker reflex on the right upper and lower extremity, bilateral Babinski signs  SENSORY: Intact to light touch,   COORDINATION: Rapid alternating movements and fine finger movements are intact. There is no dysmetria on finger-to-nose and heel-knee-shin.    GAIT/STANCE: Rely on her walker to get up from seated position, dragging right leg, stiff, cautious unsteady gait   ALLERGIES: No Known Allergies  HOME MEDICATIONS: Outpatient Medications Prior to Visit  Medication Sig Dispense Refill   atenolol (TENORMIN) 50 MG tablet Take 1 tablet (50 mg total) by mouth daily. 90 tablet 4   calcium citrate (CALCITRATE - DOSED IN MG ELEMENTAL  CALCIUM) 950 MG tablet Take 1 tablet by mouth daily.       Cholecalciferol (VITAMIN D3) 1000 units CAPS Take by mouth daily.     lisinopril (PRINIVIL,ZESTRIL) 40 MG tablet TAKE 1/2 TABLET BY MOUTH EVERY DAY 30 tablet 5   ocrelizumab 600 mg in sodium chloride 0.9 % 500 mL Inject 600 mg into the vein every 6 (six) months.     ocrelizumab 600 mg in sodium chloride 0.9 % 500 mL Inject 600 mg into the vein every 6 (six) months. 1 each 5    OCREVUS 300 MG/10ML injection INFUSE 600MG  EVERY SIX MONTHS. 20 mL 0   PROVIGIL 200 MG tablet Take 1 tablet (200 mg total) by mouth daily. 90 tablet 1   sertraline (ZOLOFT) 100 MG tablet Take 1 tablet (100 mg total) by mouth daily. 90 tablet 4   No facility-administered medications prior to visit.    PAST MEDICAL HISTORY: Past Medical History:  Diagnosis Date   Abnormality of gait    Hypertension    MS (multiple sclerosis)    Other nonspecific abnormal result of function study of brain and central nervous system    Personal history of fall     PAST SURGICAL HISTORY: Past Surgical History:  Procedure Laterality Date   CHOLECYSTECTOMY     GALLBLADDER SURGERY      FAMILY HISTORY: Family History  Problem Relation Age of Onset   Cancer Maternal Grandmother    High blood pressure Other        Runs in both sides of the family    SOCIAL HISTORY: Social History   Socioeconomic History   Marital status: Single    Spouse name: Not on file   Number of children: 2   Years of education: 12   Highest education level: Not on file  Occupational History   Occupation: FILE Marine scientist: [employer]  Tobacco Use   Smoking status: Never   Smokeless tobacco: Never  Vaping Use   Vaping Use: Never used  Substance and Sexual Activity   Alcohol use: No   Drug use: No   Sexual activity: Not Currently  Other Topics Concern   Not on file  Social History Narrative   Patient has a high school education and two children. Patient is not currently working.   Patient is right-handed.   Patient drinks three cups of tea daily and two cans of soda daily.   Social Determinants of Health   Financial Resource Strain: Not on file  Food Insecurity: Not on file  Transportation Needs: Not on file  Physical Activity: Not on file  Stress: Not on file  Social Connections: Not on file  Intimate Partner Violence: Not on file   Levert Feinstein, M.D. Ph.D.  [facility] [address] [place], [place] [address] Phone: [phone] Fax:      [phone]

## 2022-10-02 ENCOUNTER — Other Ambulatory Visit (HOSPITAL_COMMUNITY): Payer: Self-pay

## 2022-11-05 ENCOUNTER — Telehealth: Payer: Self-pay

## 2022-11-05 ENCOUNTER — Other Ambulatory Visit (HOSPITAL_COMMUNITY): Payer: Self-pay

## 2022-11-05 NOTE — Telephone Encounter (Signed)
Patient Advocate Encounter   Received notification from [employer] Medicare Part D that prior authorization is required for Provigil 200MG  tablets   Submitted: 11-05-2022 Key [id number]  Status is pending

## 2022-11-11 NOTE — Telephone Encounter (Signed)
Generic Provigil is fine.  Please let the patient know.  Thanks, Maralyn Sago

## 2022-11-11 NOTE — Telephone Encounter (Signed)
Left msg 1st attempt  

## 2022-11-11 NOTE — Telephone Encounter (Signed)
Patient Advocate Encounter  Received a fax from [employer] Part D regarding Prior Authorization for Provigil 200MG  tablets.   Key: [id number]  Authorization has been DENIED due to

## 2022-11-12 NOTE — Telephone Encounter (Signed)
Pt aware generic provigil is fine an voiced gratitude and understanding.

## 2023-02-19 ENCOUNTER — Other Ambulatory Visit: Payer: Self-pay | Admitting: Neurology

## 2023-02-19 DIAGNOSIS — G35 Multiple sclerosis: Secondary | ICD-10-CM

## 2023-02-20 ENCOUNTER — Inpatient Hospital Stay (HOSPITAL_COMMUNITY): Admission: RE | Admit: 2023-02-20 | Payer: 59 | Source: Ambulatory Visit

## 2023-02-24 ENCOUNTER — Non-Acute Institutional Stay (HOSPITAL_COMMUNITY)
Admission: RE | Admit: 2023-02-24 | Discharge: 2023-02-24 | Disposition: A | Discharge status: Home / Self Care | Payer: 59 | Source: Ambulatory Visit | Attending: Internal Medicine

## 2023-02-24 DIAGNOSIS — G35 Multiple sclerosis: Secondary | ICD-10-CM | POA: Diagnosis present

## 2023-02-24 MED ORDER — DIPHENHYDRAMINE HCL 25 MG PO CAPS: 25.0000 mg | ORAL_CAPSULE | ORAL | 0 refills | Status: AC | PRN

## 2023-02-24 MED ORDER — SODIUM CHLORIDE 0.9 % IV SOLN: INTRAVENOUS | Status: DC | PRN

## 2023-02-24 MED ORDER — SODIUM CHLORIDE 0.9 % IV SOLN: 600.0000 mg | Freq: Once | INTRAVENOUS | 1 refills | Status: AC

## 2023-02-24 MED ORDER — ACETAMINOPHEN 500 MG PO TABS
500.0000 mg | ORAL_TABLET | Freq: Once | ORAL | Status: AC
  Administered 2023-02-24: 500 mg via ORAL
  Filled 2023-02-24: qty 1

## 2023-02-24 MED ORDER — METHYLPREDNISOLONE NA SUC (PF) 40 MG IJ SOLR: 100.0000 mg | Freq: Once | INTRAMUSCULAR | 0 refills | Status: AC

## 2023-02-24 MED ORDER — ACETAMINOPHEN 500 MG PO TABS: 500.0000 mg | ORAL_TABLET | ORAL | 0 refills | Status: AC | PRN

## 2023-02-24 MED ORDER — METHYLPREDNISOLONE SODIUM SUCC 125 MG IJ SOLR
100.0000 mg | Freq: Once | INTRAMUSCULAR | Status: AC
  Administered 2023-02-24: 100 mg via INTRAVENOUS
  Filled 2023-02-24: qty 2

## 2023-02-24 MED ORDER — SODIUM CHLORIDE 0.9 % IV SOLN
600.0000 mg | Freq: Once | INTRAVENOUS | Status: AC
  Administered 2023-02-24: 600 mg via INTRAVENOUS
  Filled 2023-02-24: qty 20

## 2023-02-24 MED ORDER — DIPHENHYDRAMINE HCL 25 MG PO CAPS
25.0000 mg | ORAL_CAPSULE | Freq: Once | ORAL | Status: AC
  Administered 2023-02-24: 25 mg via ORAL
  Filled 2023-02-24: qty 1

## 2023-02-24 NOTE — Progress Notes (Signed)
PATIENT CARE CENTER NOTE     Diagnosis: Relapsing remitting multiple sclerosis (HCC) (G35)     Provider: Levert Feinstein, MD     Procedure: Ocrevus infusion      Note: Patient received Ocrevus infusion via PIV. Pre-medications given (PO Tylenol, PO Benadryl, IV Solumedrol) per order. Infusion titrated per protocol. Patient declined 1 hour observation post infusion. Vital signs stable. AVS offered but patient refused. Patient alert, oriented and ambulatory at discharge.

## 2023-03-24 NOTE — Progress Notes (Unsigned)
ASSESSMENT AND PLAN 42 y.o. year old female   Relapsing remitting multiple sclerosis since 2011 Progressive worsening gait abnormality -Omni is overall doing well, no worsening of MS -Continue Ocrevus,  -MRI of the brain and cervical spine with and without contrast in February 2023 were overall stable, no new lesions, extensive cervical cord involvement -On Tysabri infusion from February 2012 to December 2018 -On Ocrevus since February 2019 Obesity  Referral to weight loss program, encourage moderate exercise, water aerobic Fatigue   Continue Provigil 200 mg daily, will refill   Depression Continue Zoloft 100 mg daily   DIAGNOSTIC DATA (LABS, IMAGING, TESTING) - I reviewed patient records, labs, notes, testing and imaging myself where available.    HISTORY:   She has history of hypertension, in May 18, 2010,while walking towards her car at the end of her working day, her legs give out underneath her, she fell face down on the concrete pavement at her Parking lot infront of [employer], knocked out her front teeth.    MRI of the brain,in May 19, 2010 without contrast, demonstrated diffuse white matter changes throughout the supratentorial and infratentorial region, with corpus callosum involvement, MRI scan of the cervical spine shows demyelinating enhancing plaques at C 3 on right and C 4-5 on left. consistent with MS    Repeat MRI scan of the brain in 2012, showed multiple brainstem, cerebellar, subcortical, corpus callosal and periventricular white matter lesions typical for demyelinating disease. Several T 1 black holes and enhancing lesions are seen bilaterally suggesting chronic and active disease. Significant progression and worsening compared with noncontrast MRI 05/19/10.  CSF showed elevated Ig G index, 9 ocb, extensive lab test for mimic were negative, including HIV, RPR, b12 b6, ACE lyme SSA, B, CBC, CMP, PEP, ana, copper, mild ele crp 6.5, and very low vit  D<4.    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 In 05/17/13,  index value was 1.17. Positive with titer of 1.6 9 in June 07 2014   Over the years, repeat MRI of the brain and cervical spine showed significant lesion load, but there was no significant contrast-enhancement   Since 2017, she began to notice gradual worsening gait abnormality, more weakness at right upper and lower extremity,   Had a trial of baclofen for lower extremity spasticity with increased fatigue, no significant help in a progressive gait abnormality.    She began to receive ocrelizumab infusion since February 2020 because of worsening gait abnormality, spasticity, in addition, she also complains of increased fatigue, bowel and bladder incontinence on a daily basis, developed right foot drop, has been relying on her walker since 2019, frequent falling, also complains of worsening depression,   I have personally reviewed most recent MRI of the brain, cervical spine in August 2020,   multiple T2 hyperintensity foci within the spinal cord, adjacent to C1, C2, C3, C4-5, C5-6             MRI of brain showed multiple scattered periventricular, chest cortical, pericallosal, pontine, cerebral peduncle, right cerebellar white matter hyperintensity lesions, no contrast-enhancement, no significant change compared to previous scan in 2019,  Laboratory evaluation showed normal IgG 1380, IgA mildly elevated 425, normal IgM 29,CMP, creatinine was mildly elevated 1.03, nondetectable CD19 and 20 normal TSH, CBC,    UPDATE May 05 2020: She came in with her mother, she continue with ocrelizumab, continue have worsening gait  abnormality, worsening urinary and bowel incontinence   Last MRI of the brain was in August 2020, I personally reviewed films, multiple cervical spinal cord involvement, scattered at C1-C5 6 level, MRI of the brain showed multiple MS lesions and periventricular, juxtacortical, pericallosal, pontine, cerebral peduncle, right cerebellar white matter, multiple T1 black holes, there was no contrast-enhancement   Update December 27, 2020: She got disability finally, overall doing well, tolerating ocrelizumab infusion, continue have gait abnormality, right side has more difficulty, rely on walker for longer distance, also has fatigue, urinary urgency, incontinence  UPDATE April 8th 2024: She walks with walker now, chronic constipation, no significant bladder incontinence, slow worsening gait abnormality, also bothered by her continuous weight gain,  Personally reviewed MRI of the brain and cervical spine with without contrast from February 2023: Stable supratentorium lesions, also evidence of left pontine involvement, patchy signal abnormality scattered throughout the cervical cord, no contrast-enhancement  Laboratory evaluations, normal IgG, decreased IgM, which is about her baseline level, creatinine was mildly elevated 1.27, EGFR 54, mild anemia hemoglobin of 10.8, which is also about her baseline level, previous laboratory evaluation showed normal B12, iron level,  Update March 25, 2023 SS:     REVIEW OF SYSTEMS: Out of a complete 14 system review of symptoms, the patient complains only of the following symptoms, and all other reviewed systems are negative.  See HPI  PHYSICAL EXAM  Vitals:   09/09/22 0858  BP: 115/81  Pulse: 72  Weight: 200 lb 9.6 oz (91 kg)  Height: 5\' 4"  (1.626 m)   Body mass index is 34.43 kg/m.   PHYSICAL EXAMNIATION:  Gen: NAD, conversant, well nourised, well groomed                     Cardiovascular: Regular rate rhythm, no peripheral edema, warm,  nontender. Eyes: Conjunctivae clear without exudates or hemorrhage Neck: Supple, no carotid bruits. Pulmonary: Clear to auscultation bilaterally   NEUROLOGICAL EXAM:  MENTAL STATUS: Speech/cognition: Awake, alert oriented to history taking and casual conversation  CRANIAL NERVES: CN II: Visual fields are full to confrontation.  Pupils are round equal and briskly reactive to light. CN III, IV, VI: extraocular movement are normal. No ptosis. CN V: Facial sensation is intact to pinprick in all 3 divisions bilaterally. Corneal responses are intact.  CN VII: Mild shallow right nasolabial fold, CN VIII: Hearing is normal to casual conversation CN IX, X: Palate elevates symmetrically. Phonation is normal. CN XI: Head turning and shoulder shrug are intact CN XII: Tongue is midline with normal movements and no atrophy.  MOTOR: Mild right arm and leg weakness,  REFLEXES: Brisker reflex on the right upper and lower extremity, bilateral Babinski signs  SENSORY: Intact to light touch,   COORDINATION: Rapid alternating movements and fine finger movements are intact. There is no dysmetria on finger-to-nose and heel-knee-shin.    GAIT/STANCE: Rely on her walker to get up from seated position, dragging right leg, stiff, cautious unsteady gait   ALLERGIES: No Known Allergies  HOME MEDICATIONS: Outpatient Medications Prior to Visit  Medication Sig Dispense Refill   acetaminophen (TYLENOL) 500 MG tablet Take 1 tablet (500 mg total) by mouth as needed. As needed prior and after  infusion 30 tablet 0   atenolol (TENORMIN) 50 MG tablet Take 1 tablet (50 mg total) by mouth daily. 90 tablet 4   calcium citrate (CALCITRATE - DOSED IN MG ELEMENTAL CALCIUM) 950 MG tablet Take 1 tablet by mouth daily.       Cholecalciferol (VITAMIN D3) 1000 units CAPS Take by mouth daily.     diphenhydrAMINE (BENADRYL ALLERGY) 25 mg capsule Take 1 capsule (25 mg total) by mouth as needed. Prior to iv infusion 30 capsule  0   lisinopril (PRINIVIL,ZESTRIL) 40 MG tablet TAKE 1/2 TABLET BY MOUTH EVERY DAY 30 tablet 5   ocrelizumab 600 mg in sodium chloride 0.9 % 500 mL Inject 600 mg into the vein every 6 (six) months.     ocrelizumab 600 mg in sodium chloride 0.9 % 500 mL Inject 600 mg into the vein every 6 (six) months. 1 each 5   OCREVUS 300 MG/10ML injection INFUSE 600MG  EVERY SIX MONTHS. 20 mL 0   PROVIGIL 200 MG tablet Take 1 tablet (200 mg total) by mouth daily. 90 tablet 3   sertraline (ZOLOFT) 100 MG tablet Take 1 tablet (100 mg total) by mouth daily. 90 tablet 4   No facility-administered medications prior to visit.    PAST MEDICAL HISTORY: Past Medical History:  Diagnosis Date   Abnormality of gait    Hypertension    MS (multiple sclerosis) (HCC)    Other nonspecific abnormal result of function study of brain and central nervous system    Personal history of fall     PAST SURGICAL HISTORY: Past Surgical History:  Procedure Laterality Date   CHOLECYSTECTOMY     GALLBLADDER SURGERY      FAMILY HISTORY: Family History  Problem Relation Age of Onset   Cancer Maternal Grandmother    High blood pressure Other        Runs in both sides of the family    SOCIAL HISTORY: Social History   Socioeconomic History   Marital status: Single    Spouse name: Not on file   Number of children: 2   Years of education: 12   Highest education level: Not on file  Occupational History   Occupation: FILE Marine scientist: [employer]  Tobacco Use   Smoking status: Never   Smokeless tobacco: Never  Vaping Use   Vaping status: Never Used  Substance and Sexual Activity   Alcohol use: No   Drug use: No   Sexual activity: Not Currently  Other Topics Concern   Not on file  Social History Narrative   Patient has a high school education and two children. Patient is not currently working.   Patient is right-handed.   Patient drinks three cups of tea daily and two cans of soda daily.   Social  Determinants of Health   Financial Resource Strain: Not on file  Food Insecurity: Not on file  Transportation Needs: Not on file  Physical Activity: Not on file  Stress: Not on file  Social Connections: Not on file  Intimate Partner Violence: Not on file  Margie Ege, Edrick Oh, DNP  [facility] [address] [place], [place] [address] [phone]

## 2023-03-25 ENCOUNTER — Encounter: Payer: Self-pay | Admitting: Neurology

## 2023-03-25 ENCOUNTER — Ambulatory Visit (INDEPENDENT_AMBULATORY_CARE_PROVIDER_SITE_OTHER): Payer: 59 | Admitting: Neurology

## 2023-03-25 VITALS — BP 104/68 | HR 73 | Ht 64.0 in | Wt 205.0 lb

## 2023-03-25 DIAGNOSIS — R5383 Other fatigue: Secondary | ICD-10-CM

## 2023-03-25 DIAGNOSIS — R269 Unspecified abnormalities of gait and mobility: Secondary | ICD-10-CM | POA: Diagnosis not present

## 2023-03-25 DIAGNOSIS — G35 Multiple sclerosis: Secondary | ICD-10-CM | POA: Diagnosis not present

## 2023-03-25 NOTE — Patient Instructions (Signed)
Continue Ocrevus, check labs today, referral for weight loss clinic

## 2023-03-26 ENCOUNTER — Telehealth: Payer: Self-pay

## 2023-03-26 LAB — COMPREHENSIVE METABOLIC PANEL
ALT: 11 [IU]/L (ref 0–32)
AST: 17 [IU]/L (ref 0–40)
Albumin: 4.3 g/dL (ref 3.9–4.9)
Alkaline Phosphatase: 96 [IU]/L (ref 44–121)
BUN/Creatinine Ratio: 15 (ref 9–23)
BUN: 16 mg/dL (ref 6–24)
Bilirubin Total: 0.4 mg/dL (ref 0.0–1.2)
CO2: 23 mmol/L (ref 20–29)
Calcium: 9.4 mg/dL (ref 8.7–10.2)
Chloride: 101 mmol/L (ref 96–106)
Creatinine, Ser: 1.07 mg/dL — ABNORMAL HIGH (ref 0.57–1.00)
Globulin, Total: 2.8 g/dL (ref 1.5–4.5)
Glucose: 84 mg/dL (ref 70–99)
Potassium: 4.3 mmol/L (ref 3.5–5.2)
Sodium: 138 mmol/L (ref 134–144)
Total Protein: 7.1 g/dL (ref 6.0–8.5)
eGFR: 66 mL/min/{1.73_m2} (ref >59)

## 2023-03-26 LAB — CBC WITH DIFFERENTIAL/PLATELET
Basophils Absolute: 0 10*3/uL (ref 0.0–0.2)
Basos: 0 %
EOS (ABSOLUTE): 0.1 10*3/uL (ref 0.0–0.4)
Eos: 1 %
Hematocrit: 34 % (ref 34.0–46.6)
Hemoglobin: 10.9 g/dL — ABNORMAL LOW (ref 11.1–15.9)
Immature Grans (Abs): 0 10*3/uL (ref 0.0–0.1)
Immature Granulocytes: 0 %
Lymphocytes Absolute: 1.1 10*3/uL (ref 0.7–3.1)
Lymphs: 13 %
MCH: 28.7 pg (ref 26.6–33.0)
MCHC: 32.1 g/dL (ref 31.5–35.7)
MCV: 90 fL (ref 79–97)
Monocytes Absolute: 0.5 10*3/uL (ref 0.1–0.9)
Monocytes: 6 %
Neutrophils Absolute: 6.8 10*3/uL (ref 1.4–7.0)
Neutrophils: 80 %
Platelets: 445 10*3/uL (ref 150–450)
RBC: 3.8 x10E6/uL (ref 3.77–5.28)
RDW: 13.2 % (ref 11.7–15.4)
WBC: 8.6 10*3/uL (ref 3.4–10.8)

## 2023-03-26 LAB — IGG, IGA, IGM
IgA/Immunoglobulin A, Serum: 313 mg/dL (ref 87–352)
IgG (Immunoglobin G), Serum: 1073 mg/dL (ref 586–1602)
IgM (Immunoglobulin M), Srm: 27 mg/dL (ref 26–217)

## 2023-03-26 NOTE — Telephone Encounter (Signed)
-----   Message from Glean Salvo sent at 03/26/2023  7:58 AM EDT ----- Labs are unremarkable overall stable, mildly low HGB 10.9, at baseline, creatinine 1.07. continue Ocrevus every 6 months. Thanks

## 2023-03-26 NOTE — Telephone Encounter (Signed)
Left msg to call back to obtain results

## 2023-03-27 NOTE — Telephone Encounter (Signed)
Patient called.  Patient aware.  

## 2023-04-03 ENCOUNTER — Ambulatory Visit: Payer: 59 | Admitting: Bariatrics

## 2023-06-09 NOTE — Progress Notes (Signed)
 CC: Breast and Pelvic Exam ( IUD 11/2027)  Chief Complaint  Patient presents with  . Gynecologic Exam    PAP: 05/21/2021 nl BCM: Mirena (11/24/19) MMG: 09/10/22 neg     Subjective:   HPI: Julie Pope is a 43 y.o. y/o G2P2002 who presents today for annual exam No LMP recorded. Patient has had an implant.   She states she is doing well without any major medical complaints. Pt denies any vaginal bleeding.   She denies any bladder complaints. She does not have any urgency, frequency, hematuria, and stress urinary incontinence. She denies any bowel complaints. She denies any vaginal discharge. Patient denies any breast pains or masses.  She is NOT sexually active  ______________________________________________________________________  Past Surgical History:  Procedure Laterality Date  . BREAST BIOPSY    . CHOLECYSTECTOMY     Procedure: CHOLECYSTECTOMY    No Known Allergies  Past Medical History:  Diagnosis Date  . Hypertension   . Multiple sclerosis (CMD)   . Vitamin B12 deficient megaloblastic anemia 11/03/2019    OB History  Gravida Para Term Preterm AB Living  2 2 2  0 0 2  SAB IAB Ectopic Molar Multiple Live Births  0 0 0 0  2    # Outcome Date GA Lbr Len/2nd Weight Sex Type Anes PTL Lv  2 Term           1 Term               Family History  Problem Relation Name Age of Onset  . Hypertension Mother    . Diabetes Mother    . No Known Problems Father    . Hypertension Maternal Aunt    . Hypertension Maternal Grandmother    . Breast cancer Neg Hx    . Colon cancer Neg Hx    . Ovarian cancer Neg Hx      Social History   Socioeconomic History  . Marital status: Single    Spouse name: Not on file  . Number of children: Not on file  . Years of education: Not on file  . Highest education level: Not on file  Occupational History  . Not on file  Tobacco Use  . Smoking status: Never  . Smokeless tobacco: Never  Substance and Sexual Activity  . Alcohol use: No   . Drug use: No  . Sexual activity: Yes    Partners: Male    Birth control/protection: I.U.D.  Other Topics Concern  . Not on file  Social History Narrative  . Not on file   Social Drivers of Health   Food Insecurity: Not on file  Transportation Needs: Not on file  Safety: Low Risk  (01/10/2023)   Safety   . How often does anyone, including family and friends, physically hurt you?: Never   . How often does anyone, including family and friends, insult or talk down to you?: Never   . How often does anyone, including family and friends, threaten you with harm?: Never   . How often does anyone, including family and friends, scream or curse at you?: Never  Living Situation: Low Risk  (01/10/2023)   Living Situation   . What is your living situation today?: I have a steady place to live   . Think about the place you live. Do you have problems with any of the following? Choose all that apply:: Not on file     Current Outpatient Medications:  .  atenoloL -chlorthalidone (TENORETIC) 50-25 mg per  tablet, TAKE 1 TABLET BY MOUTH ONCE DAILY FOR 90 DAYS, Disp: , Rfl:  .  calcium citrate (CALCITRATE) 950 mg (200 mg calcium) tab, Take 200 mg by mouth Once Daily., Disp: , Rfl:  .  diphenhydrAMINE  (BENADRYL ) 25 mg capsule, Take 25 mg by mouth as needed., Disp: , Rfl:  .  ibuprofen (MOTRIN) 800 mg tablet, Take 800 mg by mouth as needed., Disp: , Rfl:  .  lisinopriL  (PRINIVIL ) 30 mg tablet, TAKE 1 TABLET BY MOUTH ONCE DAILY, Disp: , Rfl:  .  methocarbamoL (ROBAXIN) 500 mg tablet, Take 500 mg by mouth as needed., Disp: , Rfl:  .  methylPREDNISolone  sodium succinate (SOLU-Medrol ) 125 mg injection, Infuse 125 mg into a venous catheter., Disp: , Rfl:  .  ocrelizumab  (Ocrevus ) 30 mg/mL soln injection, INFUSE 600MG  EVERY SIX MONTHS., Disp: , Rfl:  .  sertraline  (ZOLOFT ) 100 mg tablet, Take 100 mg by mouth Once Daily., Disp: , Rfl:  .  acetaminophen  (TYLENOL ) 500 mg tablet, Take 500 mg by mouth as needed.  (Patient not taking: Reported on 06/09/2023), Disp: , Rfl:  .  cholecalciferol 25 mcg (1,000 unit) cap, Take by mouth daily. (Patient not taking: Reported on 06/09/2023), Disp: , Rfl:  .  diphenhydrAMINE  (BENADRYL ) 50 mg capsule, Take 50 mg by mouth. (Patient not taking: Reported on 06/09/2023), Disp: , Rfl:  .  modafiniL  (PROVIGIL ) 200 mg tablet, Take 200 mg by mouth. (Patient not taking: Reported on 06/09/2023), Disp: , Rfl:      Personal- 43 year-old, 43 year old, disabled from MS, not date     Review Of System:  General: no fever, fatigue, weight or appetite changes Skin: no rashes, lesions, itching, mole change HEENT: no frequent headaches, hearing changes, sore throat, dental problems, sinus problems   Lymph: no tender or swollen lymph nodes  Breasts: no lumps, nipple discharge, breast pain Resp: no SOB, cough, wheezing Cardio: no chest pain, palpitations, edema GI: no N/V/D, constipation, anorexia, bloating, reflux, blood in stool, leakage of stool  GU: no frequency, dysuria, flank pain, hematuria, incontinence, retention       GYN: no change in discharge or odor, itching, inter-menstrual or post-coital bleeding, dyspareunia  Musk: no weakness, arthralgia, joint swelling, ROM limitations Endocrine: no polydypsia, polyphagia, polyuria, heat/cold intolerance, hot flashes, dry skin Psych: no depression, anxiety, panic attacks  Objective:   PHYSICAL EXAM: BP 117/76   Ht 1.626 m (5' 4)   Wt 93 kg (205 lb)   BMI 35.19 kg/m  Constitutional: Well-developed, well-nourished female in no acute distress Neurological: Alert and oriented to person, place, and time, affect appropriate Psychiatric: Mood and affect appropriate Skin: No rashes or lesions, No suspicious lesions, masses, or ulcers.  Neck: Supple without masses. Trachea is midline.Thyroid is normal size without masses, carotid pulses normal and no bruits Lymphatics: No cervical, axillary, supraclavicular, or inguinal adenopathy  noted Respiratory: Clear to auscultation bilaterally. Good air movement with normal work of  breathing. Cardiovascular: Regular rate and rhythm. Extremities grossly normal, nontender with no edema; pulses regular Gastrointestinal: Soft, nontender, nondistended. No masses or hernias appreciated. No hepatosplenomegaly. No fluid wave. No rebound or guarding. Breast Exam: Appear normal and symmetric without palpable masses, skin changes or nipple inversion.  No discharge, rash, or skin retraction. No palpable lymph nodes. No tenderness, masses, or nipple abnormality Genitourinary:         External Genitalia: Normal female genitalia w/o masses, lesions, rashes    Urethral Meatus: Normal caliber and position    Urethra: Midline,  no masses    Bladder: Well-suspended, NT    Vagina: mucosa pink and moist without lesions or ulcers     Cervix: No lesions, normal size and consistency; no cervical motion tenderness     Uterus: Normal size and contour; smooth, mobile, NT Adnexa/Parametria: No masses; no parametrial nodularity; no tenderness Perineum/Anus: No lesions, no hemorids, normal specter tone   Chaperone present during exam.   RECENT LABS: Results for orders placed or performed in visit on 01/10/23  Ferritin   Collection Time: 01/10/23 12:44 PM  Result Value Ref Range   Ferritin 388 (H) 11 - 307 ng/mL  Iron And Total Iron Binding Capacity   Collection Time: 01/10/23 12:44 PM  Result Value Ref Range   Iron 51 50 - 212 ug/dL   Transferrin 767 796 - 362 mg/dL   Total Iron Binding Capacity (TIBC) 332 290 - 518 ug/dL   Transferrin Saturation 15 15 - 45 %  Vitamin B12   Collection Time: 01/10/23 12:44 PM  Result Value Ref Range   Vitamin B-12 907 180 - 914 pg/mL  CBC with Differential   Collection Time: 01/10/23 12:44 PM  Result Value Ref Range   WBC 8.38 4.40 - 11.00 10*3/uL   RBC 3.87 (L) 4.10 - 5.10 10*6/uL   Hemoglobin 11.0 (L) 12.3 - 15.3 g/dL   Hematocrit 66.2 (L) 64.0 - 44.6 %    Mean Corpuscular Volume (MCV) 87.2 80.0 - 96.0 fL   Mean Corpuscular Hemoglobin (MCH) 28.3 27.5 - 33.2 pg   Mean Corpuscular Hemoglobin Conc (MCHC) 32.5 (L) 33.0 - 37.0 g/dL   Red Cell Distribution Width (RDW) 14.8 12.3 - 17.0 %   Platelet Count (PLT) 463 (H) 150 - 450 10*3/uL   Mean Platelet Volume (MPV) 7.2 6.8 - 10.2 fL   Neutrophils % 77 %   Lymphocytes % 15 %   Monocytes % 7 %   Eosinophils % 1 %   Basophils % 1 %   Neutrophils Absolute 6.50 1.80 - 7.80 10*3/uL   Lymphocytes # 1.20 1.00 - 4.80 10*3/uL   Monocytes # 0.50 0.00 - 0.80 10*3/uL   Eosinophils # 0.10 0.00 - 0.50 10*3/uL   Basophils # 0.00 0.00 - 0.20 10*3/uL      Assessment/Plan:  Julie Pope is a 43 y.o. female G2P2002 who presents for annual exam.     Pt will follow in 1 year for a physical exam and knows the importance of getting her routine screening labs and procedures.  Calcium supplementation, STD prevention, importance of avoiding cigarettes, avoiding a high fat diet and exercise were discussed with the patient.  She was told that she needed to do monthly breast exams, get a mammogram yearly starting at age 18, get a lipid profile every 3-5 years, and needed a colonoscopy at 48.  >  Pap smear:  nl in 2022

## 2023-08-19 ENCOUNTER — Other Ambulatory Visit: Payer: Self-pay

## 2023-08-19 ENCOUNTER — Telehealth: Payer: Self-pay

## 2023-08-19 DIAGNOSIS — G35 Multiple sclerosis: Secondary | ICD-10-CM

## 2023-08-19 NOTE — Telephone Encounter (Signed)
 Received request from [facility] to place new orders for Ocrevus. Appears new auth   needed via [facility] Medicare. PA needing to be done under Medical plan as buy/bill. Cannot be done under pharmacy since pt goes to outpt infusion at hospital.    Call to Medicare, spoke with Olegario Messier, NO PA needed for Ocrevus infusion. Ref# [id number]  [id number]  Infusion center info:  [facility] (sometimes listed as [facility] w/ insurance) Address: [address] [place], [place], [place] [address] Phone: [phone] Fax: [phone] Tax ID: [id number] NPI: [id number]   Previous auth notes:  "PA for Ocrevus pending approval by [place] Medicaid [id number]). Case submitted on [facility] Tracks provider portal. Pt AT#[id number] O.   Diagnosed with MS in 05/2010. Tysabri from 07/2010 to 05/2017. JCV ab 2.88. Changed to Ocrevus 07/2017. "

## 2023-08-25 ENCOUNTER — Non-Acute Institutional Stay (HOSPITAL_COMMUNITY)
Admission: RE | Admit: 2023-08-25 | Discharge: 2023-08-25 | Disposition: A | Discharge status: Home / Self Care | Payer: 59 | Source: Ambulatory Visit | Attending: Internal Medicine | Admitting: Internal Medicine

## 2023-08-25 DIAGNOSIS — G35 Multiple sclerosis: Secondary | ICD-10-CM | POA: Diagnosis present

## 2023-08-25 MED ORDER — SODIUM CHLORIDE 0.9 % IV SOLN
600.0000 mg | Freq: Once | INTRAVENOUS | Status: AC
  Administered 2023-08-25: 600 mg via INTRAVENOUS
  Filled 2023-08-25: qty 20

## 2023-08-25 MED ORDER — ACETAMINOPHEN 325 MG PO TABS
650.0000 mg | ORAL_TABLET | Freq: Once | ORAL | Status: AC
  Administered 2023-08-25: 650 mg via ORAL
  Filled 2023-08-25: qty 2

## 2023-08-25 MED ORDER — DIPHENHYDRAMINE HCL 50 MG/ML IJ SOLN
50.0000 mg | Freq: Once | INTRAMUSCULAR | Status: AC
  Administered 2023-08-25: 50 mg via INTRAVENOUS
  Filled 2023-08-25: qty 1

## 2023-08-25 MED ORDER — METHYLPREDNISOLONE SODIUM SUCC 125 MG IJ SOLR
125.0000 mg | Freq: Once | INTRAMUSCULAR | Status: AC
  Administered 2023-08-25: 125 mg via INTRAVENOUS
  Filled 2023-08-25: qty 2

## 2023-08-25 MED ORDER — SODIUM CHLORIDE 0.9 % IV SOLN: INTRAVENOUS | Status: DC | PRN

## 2023-08-25 MED ORDER — FAMOTIDINE IN NACL 20-0.9 MG/50ML-% IV SOLN
20.0000 mg | Freq: Once | INTRAVENOUS | Status: AC
  Administered 2023-08-25: 20 mg via INTRAVENOUS
  Filled 2023-08-25: qty 50

## 2023-08-25 NOTE — Progress Notes (Signed)
 PATIENT CARE CENTER NOTE  Diagnosis: Relapsing remitting multiple sclerosis (HCC) [G35]    Provider: Levert Feinstein MD  Procedure: Ocrevus 600 mg   Note: Patient received Ocrevus 600 mg (dose #1 of 1) via piv. Patient pre-medicated with PO Tylenol, IV Benadryl, IV Solu-medrol, and IVPB Pepcid per orders. Infusion titrated per protocol. Pt tolerated the infusion with no adverse reaction. Vitals are stable.Pt declined to wait the 1 hour post infusion observation period. AVS offered, but pt declined. Pt advised that she should schedule her next infusion appointment for 6 months at the front desk.  Pt is alert, oriented, and ambulatory at discharge.

## 2023-11-04 NOTE — Progress Notes (Unsigned)
 ASSESSMENT AND PLAN 43 y.o. year old female   1.  Relapsing remitting multiple sclerosis since 2011 2.  Progressive worsening gait abnormality -No major changes, no worsening of MS -We will continue Ocrevus  infusion every 6 months -Check routine labs today CBC, CMP, IgG IgA IgM -Plan for repeat MRI imaging at next visit -MRI of the brain and cervical spine with and without contrast in February 2023 were overall stable, no new lesions, extensive cervical cord involvement -On Tysabri  infusion from February 2012 to December 2018 -On Ocrevus  since February 2019 3.  Obesity  I again placed referral to weight loss management 4.  Fatigue   Continue Provigil  200 mg daily, indicates [facility] has been feeling, looking on drug registry does not look like it has been filled since February 2023.  She indicates that is helping.  Contact me if refills needed.  5.  Depression Continue Zoloft  100 mg daily  Follow-up with me in 6 months or sooner if needed  DIAGNOSTIC DATA (LABS, IMAGING, TESTING) - I reviewed patient records, labs, notes, testing and imaging myself where available.    HISTORY:   She has history of hypertension, in May 18, 2010,while walking towards her car at the end of her working day, her legs give out underneath her, she fell face down on the concrete pavement at her Parking lot infront of [employer], knocked out her front teeth.    MRI of the brain,in May 19, 2010 without contrast, demonstrated diffuse white matter changes throughout the supratentorial and infratentorial region, with corpus callosum involvement, MRI scan of the cervical spine shows demyelinating enhancing plaques at C 3 on right and C 4-5 on left. consistent with MS    Repeat MRI scan of the brain in 2012, showed multiple brainstem, cerebellar, subcortical, corpus callosal and periventricular white matter lesions typical for demyelinating disease. Several T 1 black holes and enhancing lesions  are seen bilaterally suggesting chronic and active disease. Significant progression and worsening compared with noncontrast MRI 05/19/10.  CSF showed elevated Ig G index, 9 ocb, extensive lab test for mimic were negative, including HIV, RPR, b12 b6, ACE lyme SSA, B, CBC, CMP, PEP, ana, copper, mild ele crp 6.5, and very low vit D<4.    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 In 05/17/13,  index value was 1.17. Positive with titer of 1.6 9 in June 07 2014   Over the years, repeat MRI of the brain and cervical spine showed significant lesion load, but there was no significant contrast-enhancement   Since 2017, she began to notice gradual worsening gait abnormality, more weakness at right upper and lower extremity,   Had a trial of baclofen  for lower extremity spasticity with increased fatigue, no significant help in a progressive gait abnormality.    She began to receive ocrelizumab  infusion since February 2020 because of worsening gait abnormality, spasticity, in addition, she also complains of increased fatigue, bowel and bladder incontinence on a daily basis, developed right foot drop, has been relying on her walker since 2019, frequent falling, also complains of worsening depression,   I have personally reviewed most recent MRI of the  brain, cervical spine in August 2020,   multiple T2 hyperintensity foci within the spinal cord, adjacent to C1, C2, C3, C4-5, C5-6             MRI of brain showed multiple scattered periventricular, chest cortical, pericallosal, pontine, cerebral peduncle, right cerebellar white matter hyperintensity lesions, no contrast-enhancement, no significant  change compared to previous scan in 2019,   Laboratory evaluation showed normal IgG 1380, IgA mildly elevated 425, normal IgM 29,CMP, creatinine was mildly elevated 1.03, nondetectable CD19 and 20 normal TSH, CBC,    UPDATE May 05 2020: She came in with her mother, she continue with ocrelizumab , continue have worsening gait abnormality, worsening urinary and bowel incontinence   Last MRI of the brain was in August 2020, I personally reviewed films, multiple cervical spinal cord involvement, scattered at C1-C5 6 level, MRI of the brain showed multiple MS lesions and periventricular, juxtacortical, pericallosal, pontine, cerebral peduncle, right cerebellar white matter, multiple T1 black holes, there was no contrast-enhancement   Update December 27, 2020: She got disability finally, overall doing well, tolerating ocrelizumab  infusion, continue have gait abnormality, right side has more difficulty, rely on walker for longer distance, also has fatigue, urinary urgency, incontinence  UPDATE April 8th 2024: She walks with walker now, chronic constipation, no significant bladder incontinence, slow worsening gait abnormality, also bothered by her continuous weight gain,  Personally reviewed MRI of the brain and cervical spine with without contrast from February 2023: Stable supratentorium lesions, also evidence of left pontine involvement, patchy signal abnormality scattered throughout the cervical cord, no contrast-enhancement  Laboratory evaluations, normal IgG, decreased IgM, which is about her baseline level, creatinine was mildly elevated 1.27, EGFR 54, mild anemia hemoglobin of 10.8, which is also about her baseline level, previous laboratory evaluation showed normal B12, iron level,  Update March 25, 2023 SS: Last Ocrevus  was 02/24/23, doing well with it. Before next infusion, will get weaker, feels stronger energized after infusion. Hasn't followed up with weight loss. Takes Provigil  200 mg daily,  give energy, is happy with it. Mood is improving on Zoloft  100 mg. Vision is fine. Right side is chronically weaker, constipation is getting better, watches diet. No urinary incontinence. No falls, but a few near falls. Has walker and cane. Getting B12 infusions. On Vitamin D .   Update November 05, 2023 SS: Labs last visit HGB 10.9, normal IGG IGM IGA. Last Ocrevus  March 2025  REVIEW OF SYSTEMS: Out of a complete 14 system review of symptoms, the patient complains only of the following symptoms, and all other reviewed systems are negative.  See HPI  PHYSICAL EXAM  Vitals:   09/09/22 0858  BP: 115/81  Pulse: 72  Weight: 200 lb 9.6 oz (91 kg)  Height: 5\' 4"  (1.626 m)   Body mass index is 34.43 kg/m.  PHYSICAL EXAMNIATION:  Gen: NAD, conversant, well nourised, well groomed                     Cardiovascular: Regular rate rhythm, no peripheral edema, warm, nontender. Eyes: Conjunctivae clear without exudates or hemorrhage Neck: Supple, no carotid bruits. Pulmonary: Clear to auscultation bilaterally   NEUROLOGICAL EXAM:  MENTAL STATUS: Speech/cognition: Awake, alert oriented to history taking and casual conversation  CRANIAL NERVES: CN II: Visual fields are full to confrontation.  Pupils are round equal and briskly reactive to light. CN III, IV, VI: extraocular movement are normal. No ptosis.  MOTOR: Mild right arm and leg weakness  REFLEXES: Brisker reflex  on the right upper and lower extremity  SENSORY: Intact to light touch,   COORDINATION: Rapid alternating movements and fine finger movements are intact. There is no dysmetria on finger-to-nose and heel-knee-shin.    GAIT/STANCE: has to push off to stand,  dragging right leg, stiff, cautious unsteady gait   ALLERGIES: No Known Allergies  HOME MEDICATIONS: Outpatient Medications Prior to Visit  Medication Sig Dispense Refill   acetaminophen  (TYLENOL ) 500 MG tablet Take 1 tablet (500 mg total) by mouth as needed. As  needed prior and after infusion 30 tablet 0   atenolol  (TENORMIN ) 50 MG tablet Take 1 tablet (50 mg total) by mouth daily. 90 tablet 4   calcium citrate (CALCITRATE - DOSED IN MG ELEMENTAL CALCIUM) 950 MG tablet Take 1 tablet by mouth daily.       Cholecalciferol (VITAMIN D3) 1000 units CAPS Take by mouth daily.     diphenhydrAMINE  (BENADRYL  ALLERGY) 25 mg capsule Take 1 capsule (25 mg total) by mouth as needed. Prior to iv infusion 30 capsule 0   lisinopril  (PRINIVIL ,ZESTRIL ) 40 MG tablet TAKE 1/2 TABLET BY MOUTH EVERY DAY 30 tablet 5   ocrelizumab  600 mg in sodium chloride  0.9 % 500 mL Inject 600 mg into the vein every 6 (six) months.     ocrelizumab  600 mg in sodium chloride  0.9 % 500 mL Inject 600 mg into the vein every 6 (six) months. 1 each 5   OCREVUS  300 MG/10ML injection INFUSE 600MG  EVERY SIX MONTHS. 20 mL 0   PROVIGIL  200 MG tablet Take 1 tablet (200 mg total) by mouth daily. 90 tablet 3   sertraline  (ZOLOFT ) 100 MG tablet Take 1 tablet (100 mg total) by mouth daily. 90 tablet 4   No facility-administered medications prior to visit.    PAST MEDICAL HISTORY: Past Medical History:  Diagnosis Date   Abnormality of gait    Hypertension    MS (multiple sclerosis) (HCC)    Other nonspecific abnormal result of function study of brain and central nervous system    Personal history of fall     PAST SURGICAL HISTORY: Past Surgical History:  Procedure Laterality Date   CHOLECYSTECTOMY     GALLBLADDER SURGERY      FAMILY HISTORY: Family History  Problem Relation Age of Onset   Cancer Maternal Grandmother    High blood pressure Other        Runs in both sides of the family    SOCIAL HISTORY: Social History   Socioeconomic History   Marital status: Single    Spouse name: Not on file   Number of children: 2   Years of education: 12   Highest education level: Not on file  Occupational History   Occupation: FILE Marine scientist: [employer]  Tobacco Use   Smoking  status: Never   Smokeless tobacco: Never  Vaping Use   Vaping status: Never Used  Substance and Sexual Activity   Alcohol use: No   Drug use: No   Sexual activity: Not Currently  Other Topics Concern   Not on file  Social History Narrative   Patient has a high school education and two children. Patient is not currently working.   Patient is right-handed.   Patient drinks three cups of tea daily and two cans of soda daily.   Social Drivers of Corporate investment banker Strain: Not on file  Food Insecurity: Not on file  Transportation Needs: Not on file  Physical Activity:  Not on file  Stress: Not on file  Social Connections: Not on file  Intimate Partner Violence: Not on file   Julie Pope, Maritza Sidles, [facility]  [facility] [address] [place], [place] [address] [phone]

## 2023-11-05 ENCOUNTER — Encounter: Payer: Self-pay | Admitting: Neurology

## 2023-11-05 ENCOUNTER — Telehealth: Payer: Self-pay | Admitting: Neurology

## 2023-11-05 ENCOUNTER — Ambulatory Visit (INDEPENDENT_AMBULATORY_CARE_PROVIDER_SITE_OTHER): Payer: 59 | Admitting: Neurology

## 2023-11-05 VITALS — BP 110/70 | HR 82 | Resp 14 | Ht 64.0 in | Wt 198.8 lb

## 2023-11-05 DIAGNOSIS — R5383 Other fatigue: Secondary | ICD-10-CM

## 2023-11-05 DIAGNOSIS — G35 Multiple sclerosis: Secondary | ICD-10-CM

## 2023-11-05 DIAGNOSIS — R269 Unspecified abnormalities of gait and mobility: Secondary | ICD-10-CM | POA: Diagnosis not present

## 2023-11-05 MED ORDER — PROVIGIL 200 MG PO TABS: 200.0000 mg | ORAL_TABLET | Freq: Every day | ORAL | 1 refills | Status: AC

## 2023-11-05 NOTE — Patient Instructions (Signed)
 Continue Ocrevus .  We will check labs today.  Check MRI of the brain and cervical spine for surveillance.  Referral to physical therapy.  Follow-up in 6 months.  Please reach out if you need anything.  Thanks!!

## 2023-11-05 NOTE — Telephone Encounter (Signed)
 no auth required sent to GI [phone]

## 2023-11-06 ENCOUNTER — Ambulatory Visit: Payer: Self-pay | Admitting: Neurology

## 2023-11-06 LAB — COMPREHENSIVE METABOLIC PANEL WITH GFR
ALT: 6 IU/L (ref 0–32)
AST: 12 IU/L (ref 0–40)
Albumin: 4.7 g/dL (ref 3.9–4.9)
Alkaline Phosphatase: 117 IU/L (ref 44–121)
BUN/Creatinine Ratio: 12 (ref 9–23)
BUN: 15 mg/dL (ref 6–24)
Bilirubin Total: 0.3 mg/dL (ref 0.0–1.2)
CO2: 23 mmol/L (ref 20–29)
Calcium: 9.8 mg/dL (ref 8.7–10.2)
Chloride: 101 mmol/L (ref 96–106)
Creatinine, Ser: 1.3 mg/dL — ABNORMAL HIGH (ref 0.57–1.00)
Globulin, Total: 2.8 g/dL (ref 1.5–4.5)
Glucose: 97 mg/dL (ref 70–99)
Potassium: 4.3 mmol/L (ref 3.5–5.2)
Sodium: 140 mmol/L (ref 134–144)
Total Protein: 7.5 g/dL (ref 6.0–8.5)
eGFR: 52 mL/min/{1.73_m2} — ABNORMAL LOW (ref >59)

## 2023-11-06 LAB — CBC WITH DIFFERENTIAL/PLATELET
Basophils Absolute: 0 10*3/uL (ref 0.0–0.2)
Basos: 0 %
EOS (ABSOLUTE): 0.1 10*3/uL (ref 0.0–0.4)
Eos: 1 %
Hematocrit: 36.8 % (ref 34.0–46.6)
Hemoglobin: 11.5 g/dL (ref 11.1–15.9)
Immature Grans (Abs): 0 10*3/uL (ref 0.0–0.1)
Immature Granulocytes: 0 %
Lymphocytes Absolute: 1 10*3/uL (ref 0.7–3.1)
Lymphs: 12 %
MCH: 28.2 pg (ref 26.6–33.0)
MCHC: 31.3 g/dL — ABNORMAL LOW (ref 31.5–35.7)
MCV: 90 fL (ref 79–97)
Monocytes Absolute: 0.4 10*3/uL (ref 0.1–0.9)
Monocytes: 5 %
Neutrophils Absolute: 6.7 10*3/uL (ref 1.4–7.0)
Neutrophils: 82 %
Platelets: 480 10*3/uL — ABNORMAL HIGH (ref 150–450)
RBC: 4.08 x10E6/uL (ref 3.77–5.28)
RDW: 13.3 % (ref 11.7–15.4)
WBC: 8.2 10*3/uL (ref 3.4–10.8)

## 2023-11-06 LAB — VITAMIN D 25 HYDROXY (VIT D DEFICIENCY, FRACTURES): Vit D, 25-Hydroxy: 6.2 ng/mL — ABNORMAL LOW (ref 30.0–100.0)

## 2023-11-06 LAB — IGG, IGA, IGM
IgA/Immunoglobulin A, Serum: 326 mg/dL (ref 87–352)
IgG (Immunoglobin G), Serum: 1220 mg/dL (ref 586–1602)
IgM (Immunoglobulin M), Srm: 20 mg/dL — ABNORMAL LOW (ref 26–217)

## 2023-11-06 MED ORDER — VITAMIN D (ERGOCALCIFEROL) 1.25 MG (50000 UNIT) PO CAPS: 50000.0000 [IU] | ORAL_CAPSULE | ORAL | 0 refills | Status: DC

## 2023-11-06 NOTE — Telephone Encounter (Signed)
-----   Message from Wess Hammed, NP sent at 11/06/2023  5:54 AM EDT ----- Please forward results to primary care. Thanks

## 2023-11-06 NOTE — Telephone Encounter (Signed)
 Please call her Vitamin D  is very low at 6.2 I will send in rx Vitamin D  50,000 units weekly x 12, then she should start 2000 units daily OTC. Have her primary care recheck in about 3-4 months at her next visit.   Her IGM was on the lower end at 20, will recheck next visit.   Mild increase in kidney function, drink plenty of water, especially before MRI.   CBC showed mild increase in platelet count for unknown reason. Will forward results over to PCP.  Meds ordered this encounter  Medications   Vitamin D , Ergocalciferol , (DRISDOL) 1.25 MG (50000 UNIT) CAPS capsule    Sig: Take 1 capsule (50,000 Units total) by mouth every 7 (seven) days.    Dispense:  12 capsule    Refill:  0

## 2023-11-10 ENCOUNTER — Other Ambulatory Visit (HOSPITAL_COMMUNITY): Payer: Self-pay

## 2023-11-10 ENCOUNTER — Telehealth: Payer: Self-pay

## 2023-11-10 NOTE — Telephone Encounter (Signed)
 Pt has called asking to speak back with [facility], CMA

## 2023-11-10 NOTE — Telephone Encounter (Signed)
 Pharmacy Patient Advocate Encounter   Received notification from CoverMyMeds that prior authorization for Modafinil  200MG  tablets is required/requested.   Insurance verification completed.   The patient is insured through [facility] .   Per test claim: PA required; PA submitted to above mentioned insurance via CoverMyMeds Key/confirmation #/EOC [id number] Status is pending

## 2023-11-11 NOTE — Telephone Encounter (Signed)
 Pharmacy Patient Advocate Encounter  Received notification from [employer] that Prior Authorization for Modafinil  200MG  tablets has been APPROVED from 11/10/2023 to 05/11/2024   PA #/Case ID/Reference #: PA Case ID #: [id number]

## 2023-11-29 ENCOUNTER — Other Ambulatory Visit: Payer: Self-pay | Admitting: Neurology

## 2023-12-02 ENCOUNTER — Ambulatory Visit
Admission: RE | Admit: 2023-12-02 | Discharge: 2023-12-02 | Disposition: A | Discharge status: Home / Self Care | Source: Ambulatory Visit | Attending: Neurology | Admitting: Neurology

## 2023-12-02 DIAGNOSIS — G35 Multiple sclerosis: Secondary | ICD-10-CM

## 2023-12-02 MED ORDER — GADOPICLENOL 0.5 MMOL/ML IV SOLN
9.0000 mL | Freq: Once | INTRAVENOUS | Status: AC | PRN
  Administered 2023-12-02: 9 mL via INTRAVENOUS

## 2023-12-03 NOTE — Telephone Encounter (Signed)
 Pt is returning call to CMA

## 2023-12-03 NOTE — Telephone Encounter (Signed)
-----   Message from Lauraine JINNY Born, NP sent at 12/03/2023  7:28 AM EDT -----   ----- Message ----- From: Vear Charlie LABOR, MD Sent: 12/02/2023   7:53 PM EDT To: Lauraine JINNY Born, NP

## 2023-12-03 NOTE — Telephone Encounter (Signed)
 Please call, MRI of the brain showed stable MS lesions, no new lesions compared to MRI in February 2023.   MRI cervical spine showed stable MS lesions, no new lesions compared to MRI in 2023.  There are degenerative changes at C3-C4 and C4-C5.  At C4-C5 there is moderate right and minimal left foraminal narrowing, but no spinal stenosis or nerve root compression.  The degenerative changes are slightly progressed compared to MRI in 2023. Monitor for any neck pain, shoulder pain radiating to the arms or weakness.  Overall MRI findings are stable and reassuring.   IMPRESSION:   This MRI of the brain with and without contrast shows the following: Multiple T2/FLAIR hyperintense foci in the cerebral hemispheres, brainstem, cerebellum and thalamus in a pattern consistent with chronic demyelinating plaque associated with multiple sclerosis.  None of the foci enhance or appear to be acute.  Compared to the MRI from 07/15/2021, there were no new lesions. Normal enhancement pattern.  No acute findings.  IMPRESSION: This MRI of the cervical spine with and without contrast shows the following: T2 hyperintense foci in the brainstem, cerebellum and spinal cord as detailed above in a pattern consistent with chronic demyelinating plaque associated with MS.  None of the foci enhancement appear to be acute.  There are no new lesions compared to the MRI from 2023. Degenerative changes at C3-C4 and C4-C5.  This causes moderate foraminal narrowing at C4-C5 but no spinal stenosis or nerve root compression.  The degenerative changes at C4-C5 are slightly progressed compared to the 2023 MRI. Normal enhancement pattern.

## 2023-12-03 NOTE — Telephone Encounter (Signed)
 Called and relayed the following: MRI of the brain showed stable MS lesions, no new lesions compared to MRI in February 2023.    MRI cervical spine showed stable MS lesions, no new lesions compared to MRI in 2023.  There are degenerative changes at C3-C4 and C4-C5.  At C4-C5 there is moderate right and minimal left foraminal narrowing, but no spinal stenosis or nerve root compression.  The degenerative changes are slightly progressed compared to MRI in 2023. Monitor for any neck pain, shoulder pain radiating to the arms or weakness.   Overall MRI findings are stable and reassurin  Pt voiced gratitude and understanding of all discussed

## 2024-02-09 ENCOUNTER — Other Ambulatory Visit (HOSPITAL_COMMUNITY): Payer: Self-pay | Admitting: *Deleted

## 2024-02-11 ENCOUNTER — Other Ambulatory Visit: Payer: Self-pay

## 2024-02-11 MED ORDER — VITAMIN D (ERGOCALCIFEROL) 1.25 MG (50000 UNIT) PO CAPS: 50000.0000 [IU] | ORAL_CAPSULE | ORAL | 0 refills | Status: AC

## 2024-02-19 ENCOUNTER — Other Ambulatory Visit: Payer: Self-pay | Admitting: Neurology

## 2024-02-19 DIAGNOSIS — G35 Multiple sclerosis: Secondary | ICD-10-CM

## 2024-02-20 ENCOUNTER — Telehealth (HOSPITAL_COMMUNITY): Payer: Self-pay

## 2024-02-20 NOTE — Telephone Encounter (Signed)
 Auth Submission: APPROVED Site of care: Site of care: [facility] Payer: [facility] [address] Medicare Dual Medication & CPT/J Code(s) submitted: Ocrevus  Antoniette) [phone] Diagnosis Code: G35 Route of submission (phone, fax, portal): portal Phone # Fax # Auth type: Buy/Bill HB Units/visits requested: 600mg  q60months Reference number: [id number] Approval from: 02/20/24 to 02/19/25

## 2024-02-25 ENCOUNTER — Ambulatory Visit (HOSPITAL_COMMUNITY)
Admission: RE | Admit: 2024-02-25 | Discharge: 2024-02-25 | Disposition: A | Discharge status: Home / Self Care | Source: Ambulatory Visit | Attending: Internal Medicine | Admitting: Internal Medicine

## 2024-02-25 ENCOUNTER — Non-Acute Institutional Stay (HOSPITAL_COMMUNITY): Admission: RE | Admit: 2024-02-25 | Source: Ambulatory Visit

## 2024-02-25 VITALS — BP 103/64 | HR 72 | Temp 97.1°F | Resp 18

## 2024-02-25 DIAGNOSIS — G35 Multiple sclerosis: Secondary | ICD-10-CM | POA: Diagnosis present

## 2024-02-25 DIAGNOSIS — Z79620 Long term (current) use of immunosuppressive biologic: Secondary | ICD-10-CM | POA: Insufficient documentation

## 2024-02-25 MED ORDER — DIPHENHYDRAMINE HCL 25 MG PO CAPS
50.0000 mg | ORAL_CAPSULE | Freq: Once | ORAL | Status: AC
  Administered 2024-02-25: 50 mg via ORAL
  Filled 2024-02-25: qty 2

## 2024-02-25 MED ORDER — ACETAMINOPHEN 325 MG PO TABS
650.0000 mg | ORAL_TABLET | Freq: Once | ORAL | Status: AC
  Administered 2024-02-25: 650 mg via ORAL
  Filled 2024-02-25: qty 2

## 2024-02-25 MED ORDER — METHYLPREDNISOLONE SODIUM SUCC 125 MG IJ SOLR
125.0000 mg | Freq: Once | INTRAMUSCULAR | Status: AC
  Administered 2024-02-25: 125 mg via INTRAVENOUS
  Filled 2024-02-25: qty 2

## 2024-02-25 MED ORDER — SODIUM CHLORIDE 0.9 % IV SOLN
600.0000 mg | Freq: Once | INTRAVENOUS | Status: AC
  Administered 2024-02-25: 600 mg via INTRAVENOUS
  Filled 2024-02-25: qty 20

## 2024-02-25 NOTE — Progress Notes (Signed)
 PATIENT CARE CENTER NOTE   Diagnosis: Multiple sclerosis [G35]     Provider: Onita Duos MD   Procedure: Ocrevus  600 mg    Note: Patient received Ocrevus  600 mg infusion via piv. Patient pre-medicated with PO Tylenol , PO Benadryl  and IV Solu-medrol  per orders. Infusion titrated per protocol. Patient tolerated the infusion with no adverse reaction. Vitals are stable. Patient declined to wait the 1 hour post infusion observation period. AVS offered, but patient declined. Patient advised that she should schedule her next infusion appointment for 6 months at the front desk.  Patient is alert, oriented, and ambulatory at discharge.

## 2024-04-16 ENCOUNTER — Encounter (HOSPITAL_COMMUNITY): Payer: Self-pay | Admitting: Neurology

## 2024-05-10 ENCOUNTER — Telehealth: Payer: Self-pay | Admitting: Neurology

## 2024-05-10 NOTE — Telephone Encounter (Signed)
 Request made to cx appointment, not needed

## 2024-05-11 ENCOUNTER — Ambulatory Visit: Admitting: Neurology

## 2024-08-24 ENCOUNTER — Inpatient Hospital Stay (HOSPITAL_COMMUNITY): Admission: RE | Admit: 2024-08-24 | Source: Ambulatory Visit
